# Patient Record
Sex: Female | Born: 1960 | Race: Black or African American | Hispanic: No | Marital: Single | Smoking: Former smoker
Health system: Southern US, Community
[De-identification: ages and names within clinical notes are randomized; demographics above are authoritative.]

## PROBLEM LIST (undated history)

## (undated) DIAGNOSIS — H269 Unspecified cataract: Secondary | ICD-10-CM

## (undated) DIAGNOSIS — T7840XA Allergy, unspecified, initial encounter: Secondary | ICD-10-CM

## (undated) DIAGNOSIS — M1990 Unspecified osteoarthritis, unspecified site: Secondary | ICD-10-CM

## (undated) DIAGNOSIS — R7303 Prediabetes: Secondary | ICD-10-CM

## (undated) DIAGNOSIS — E785 Hyperlipidemia, unspecified: Secondary | ICD-10-CM

## (undated) DIAGNOSIS — B182 Chronic viral hepatitis C: Secondary | ICD-10-CM

## (undated) DIAGNOSIS — K219 Gastro-esophageal reflux disease without esophagitis: Secondary | ICD-10-CM

## (undated) DIAGNOSIS — I10 Essential (primary) hypertension: Secondary | ICD-10-CM

## (undated) DIAGNOSIS — F329 Major depressive disorder, single episode, unspecified: Secondary | ICD-10-CM

## (undated) DIAGNOSIS — I639 Cerebral infarction, unspecified: Secondary | ICD-10-CM

## (undated) DIAGNOSIS — F32A Depression, unspecified: Secondary | ICD-10-CM

## (undated) HISTORY — DX: Hyperlipidemia, unspecified: E78.5

## (undated) HISTORY — DX: Gastro-esophageal reflux disease without esophagitis: K21.9

## (undated) HISTORY — DX: Allergy, unspecified, initial encounter: T78.40XA

## (undated) HISTORY — DX: Cerebral infarction, unspecified: I63.9

## (undated) HISTORY — DX: Unspecified cataract: H26.9

## (undated) HISTORY — DX: Prediabetes: R73.03

## (undated) HISTORY — PX: COLONOSCOPY: SHX174

## (undated) HISTORY — PX: NO PAST SURGERIES: SHX2092

## (undated) HISTORY — DX: Depression, unspecified: F32.A

---
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
## 1997-12-11 ENCOUNTER — Emergency Department (HOSPITAL_COMMUNITY): Admission: EM | Admit: 1997-12-11 | Discharge: 1997-12-11 | Payer: Self-pay | Admitting: Emergency Medicine

## 2005-07-06 ENCOUNTER — Encounter: Payer: Self-pay | Admitting: Emergency Medicine

## 2007-04-05 ENCOUNTER — Emergency Department (HOSPITAL_COMMUNITY): Admission: EM | Admit: 2007-04-05 | Discharge: 2007-04-05 | Payer: Self-pay | Admitting: Emergency Medicine

## 2007-04-30 ENCOUNTER — Emergency Department (HOSPITAL_COMMUNITY): Admission: EM | Admit: 2007-04-30 | Discharge: 2007-04-30 | Payer: Self-pay | Admitting: Emergency Medicine

## 2009-07-09 ENCOUNTER — Emergency Department (HOSPITAL_COMMUNITY): Admission: EM | Admit: 2009-07-09 | Discharge: 2009-07-09 | Payer: Self-pay | Admitting: Emergency Medicine

## 2009-07-09 ENCOUNTER — Ambulatory Visit: Payer: Self-pay | Admitting: Vascular Surgery

## 2009-07-27 ENCOUNTER — Emergency Department (HOSPITAL_COMMUNITY): Admission: EM | Admit: 2009-07-27 | Discharge: 2009-07-27 | Payer: Self-pay | Admitting: Emergency Medicine

## 2009-09-01 ENCOUNTER — Emergency Department (HOSPITAL_COMMUNITY): Admission: EM | Admit: 2009-09-01 | Discharge: 2009-09-01 | Payer: Self-pay | Admitting: Emergency Medicine

## 2009-09-29 ENCOUNTER — Emergency Department (HOSPITAL_COMMUNITY): Admission: EM | Admit: 2009-09-29 | Discharge: 2009-09-29 | Payer: Self-pay | Admitting: Emergency Medicine

## 2010-02-18 ENCOUNTER — Inpatient Hospital Stay (HOSPITAL_COMMUNITY)
Admission: EM | Admit: 2010-02-18 | Discharge: 2010-02-20 | Payer: Self-pay | Source: Home / Self Care | Attending: Cardiology | Admitting: Cardiology

## 2010-02-19 ENCOUNTER — Encounter (INDEPENDENT_AMBULATORY_CARE_PROVIDER_SITE_OTHER): Payer: Self-pay | Admitting: Physician Assistant

## 2010-05-17 LAB — DIFFERENTIAL
Basophils Absolute: 0.1 10*3/uL (ref 0.0–0.1)
Basophils Relative: 1 % (ref 0–1)
Eosinophils Absolute: 0.4 K/uL (ref 0.0–0.7)
Eosinophils Relative: 3 % (ref 0–5)
Lymphocytes Relative: 29 % (ref 12–46)
Lymphs Abs: 3.6 K/uL (ref 0.7–4.0)
Monocytes Absolute: 0.7 K/uL (ref 0.1–1.0)
Monocytes Relative: 6 % (ref 3–12)
Neutro Abs: 7.8 10*3/uL — ABNORMAL HIGH (ref 1.7–7.7)
Neutrophils Relative %: 62 % (ref 43–77)

## 2010-05-17 LAB — CBC
HCT: 40.2 % (ref 36.0–46.0)
Hemoglobin: 13.4 g/dL (ref 12.0–15.0)
MCH: 29 pg (ref 26.0–34.0)
MCH: 29.8 pg (ref 26.0–34.0)
MCHC: 32 g/dL (ref 30.0–36.0)
MCHC: 33.3 g/dL (ref 30.0–36.0)
MCV: 89.5 fL (ref 78.0–100.0)
MCV: 90.6 fL (ref 78.0–100.0)
Platelets: 268 10*3/uL (ref 150–400)
Platelets: 276 10*3/uL (ref 150–400)
RBC: 4.49 MIL/uL (ref 3.87–5.11)
RDW: 15.5 % (ref 11.5–15.5)
RDW: 15.7 % — ABNORMAL HIGH (ref 11.5–15.5)
WBC: 11.4 10*3/uL — ABNORMAL HIGH (ref 4.0–10.5)
WBC: 12.6 10*3/uL — ABNORMAL HIGH (ref 4.0–10.5)

## 2010-05-17 LAB — TSH: TSH: 1.368 u[IU]/mL (ref 0.350–4.500)

## 2010-05-17 LAB — CK TOTAL AND CKMB (NOT AT ARMC)
CK, MB: 0.7 ng/mL (ref 0.3–4.0)
Relative Index: INVALID (ref 0.0–2.5)
Total CK: 77 U/L (ref 7–177)

## 2010-05-17 LAB — CARDIAC PANEL(CRET KIN+CKTOT+MB+TROPI)
CK, MB: 0.5 ng/mL (ref 0.3–4.0)
CK, MB: 0.6 ng/mL (ref 0.3–4.0)
Relative Index: INVALID (ref 0.0–2.5)
Relative Index: INVALID (ref 0.0–2.5)
Total CK: 64 U/L (ref 7–177)
Troponin I: 0.01 ng/mL (ref 0.00–0.06)
Troponin I: 0.02 ng/mL (ref 0.00–0.06)

## 2010-05-17 LAB — BASIC METABOLIC PANEL
BUN: 6 mg/dL (ref 6–23)
CO2: 23 mEq/L (ref 19–32)
Calcium: 8.9 mg/dL (ref 8.4–10.5)
Calcium: 9 mg/dL (ref 8.4–10.5)
Chloride: 107 mEq/L (ref 96–112)
Chloride: 112 mEq/L (ref 96–112)
Creatinine, Ser: 0.82 mg/dL (ref 0.4–1.2)
Creatinine, Ser: 1 mg/dL (ref 0.4–1.2)
GFR calc Af Amer: >60 mL/min (ref >60)
GFR calc Af Amer: >60 mL/min (ref >60)
GFR calc non Af Amer: 59 mL/min — ABNORMAL LOW (ref >60)
GFR calc non Af Amer: >60 mL/min (ref >60)
Glucose, Bld: 134 mg/dL — ABNORMAL HIGH (ref 70–99)
Potassium: 3.7 mEq/L (ref 3.5–5.1)
Sodium: 141 mEq/L (ref 135–145)

## 2010-05-17 LAB — POCT CARDIAC MARKERS
CKMB, poc: <1 ng/mL — ABNORMAL LOW (ref 1.0–8.0)
CKMB, poc: <1 ng/mL — ABNORMAL LOW (ref 1.0–8.0)
Myoglobin, poc: 51.5 ng/mL (ref 12–200)
Troponin i, poc: <0.05 ng/mL (ref 0.00–0.09)

## 2010-05-17 LAB — APTT
aPTT: 30 seconds (ref 24–37)
aPTT: 31 seconds (ref 24–37)

## 2010-05-17 LAB — PROTIME-INR
INR: 1.08 (ref 0.00–1.49)
Prothrombin Time: 13.7 seconds (ref 11.6–15.2)
Prothrombin Time: 14.2 seconds (ref 11.6–15.2)

## 2010-05-17 LAB — HEMOGLOBIN A1C: Mean Plasma Glucose: 111 mg/dL (ref <117)

## 2010-05-17 LAB — TROPONIN I: Troponin I: 0.02 ng/mL (ref 0.00–0.06)

## 2010-05-17 LAB — D-DIMER, QUANTITATIVE: D-Dimer, Quant: 0.25 ug/mL-FEU (ref 0.00–0.48)

## 2010-05-25 LAB — POCT I-STAT, CHEM 8
Calcium, Ion: 1.19 mmol/L (ref 1.12–1.32)
Glucose, Bld: 83 mg/dL (ref 70–99)
HCT: 38 % (ref 36.0–46.0)
Hemoglobin: 12.9 g/dL (ref 12.0–15.0)

## 2010-05-25 LAB — CK: Total CK: 107 U/L (ref 7–177)

## 2010-11-29 LAB — POCT CARDIAC MARKERS
Myoglobin, poc: 70.9
Operator id: 146091
Troponin i, poc: <0.05

## 2010-11-29 LAB — I-STAT 8, (EC8 V) (CONVERTED LAB)
Acid-base deficit: 2
Bicarbonate: 25.2 — ABNORMAL HIGH
Chloride: 110
HCT: 40
Operator id: 146091
Sodium: 141
pCO2, Ven: 52.1 — ABNORMAL HIGH

## 2010-11-29 LAB — DIFFERENTIAL
Eosinophils Relative: 7 — ABNORMAL HIGH
Lymphocytes Relative: 18
Lymphs Abs: 1.7
Monocytes Relative: 7

## 2010-11-29 LAB — CBC
HCT: 36.5
Platelets: 376
RBC: 4.13
WBC: 10

## 2010-12-07 ENCOUNTER — Emergency Department (HOSPITAL_COMMUNITY)
Admission: EM | Admit: 2010-12-07 | Discharge: 2010-12-07 | Disposition: A | Discharge status: Home / Self Care | Payer: Self-pay | Attending: Emergency Medicine | Admitting: Emergency Medicine

## 2010-12-07 ENCOUNTER — Emergency Department (HOSPITAL_COMMUNITY): Payer: Self-pay

## 2010-12-07 DIAGNOSIS — S335XXA Sprain of ligaments of lumbar spine, initial encounter: Secondary | ICD-10-CM | POA: Insufficient documentation

## 2010-12-07 DIAGNOSIS — X58XXXA Exposure to other specified factors, initial encounter: Secondary | ICD-10-CM | POA: Insufficient documentation

## 2010-12-07 DIAGNOSIS — M545 Low back pain, unspecified: Secondary | ICD-10-CM | POA: Insufficient documentation

## 2010-12-07 DIAGNOSIS — R109 Unspecified abdominal pain: Secondary | ICD-10-CM | POA: Insufficient documentation

## 2010-12-07 DIAGNOSIS — R0789 Other chest pain: Secondary | ICD-10-CM | POA: Insufficient documentation

## 2010-12-07 LAB — DIFFERENTIAL
Eosinophils Absolute: 0.4 10*3/uL (ref 0.0–0.7)
Eosinophils Relative: 4 % (ref 0–5)
Lymphs Abs: 2.5 10*3/uL (ref 0.7–4.0)
Monocytes Absolute: 0.7 10*3/uL (ref 0.1–1.0)
Monocytes Relative: 6 % (ref 3–12)

## 2010-12-07 LAB — URINALYSIS, ROUTINE W REFLEX MICROSCOPIC
Glucose, UA: NEGATIVE mg/dL
Hgb urine dipstick: NEGATIVE
Protein, ur: NEGATIVE mg/dL

## 2010-12-07 LAB — POCT I-STAT, CHEM 8
BUN: 6 mg/dL (ref 6–23)
Calcium, Ion: 1.23 mmol/L (ref 1.12–1.32)
Chloride: 109 mEq/L (ref 96–112)
Glucose, Bld: 124 mg/dL — ABNORMAL HIGH (ref 70–99)

## 2010-12-07 LAB — CBC
MCH: 30.8 pg (ref 26.0–34.0)
MCV: 89.3 fL (ref 78.0–100.0)
Platelets: 264 10*3/uL (ref 150–400)
RDW: 14.6 % (ref 11.5–15.5)

## 2011-01-29 ENCOUNTER — Other Ambulatory Visit: Payer: Self-pay

## 2011-01-29 ENCOUNTER — Encounter: Payer: Self-pay | Admitting: Nurse Practitioner

## 2011-01-29 ENCOUNTER — Emergency Department (HOSPITAL_COMMUNITY)
Admission: EM | Admit: 2011-01-29 | Discharge: 2011-01-29 | Disposition: A | Discharge status: Home / Self Care | Payer: Self-pay | Attending: Emergency Medicine | Admitting: Emergency Medicine

## 2011-01-29 ENCOUNTER — Emergency Department (HOSPITAL_COMMUNITY): Payer: Self-pay

## 2011-01-29 DIAGNOSIS — M546 Pain in thoracic spine: Secondary | ICD-10-CM | POA: Insufficient documentation

## 2011-01-29 DIAGNOSIS — Z79899 Other long term (current) drug therapy: Secondary | ICD-10-CM | POA: Insufficient documentation

## 2011-01-29 DIAGNOSIS — H9209 Otalgia, unspecified ear: Secondary | ICD-10-CM | POA: Insufficient documentation

## 2011-01-29 DIAGNOSIS — IMO0002 Reserved for concepts with insufficient information to code with codable children: Secondary | ICD-10-CM | POA: Insufficient documentation

## 2011-01-29 DIAGNOSIS — Y92009 Unspecified place in unspecified non-institutional (private) residence as the place of occurrence of the external cause: Secondary | ICD-10-CM | POA: Insufficient documentation

## 2011-01-29 DIAGNOSIS — R079 Chest pain, unspecified: Secondary | ICD-10-CM | POA: Insufficient documentation

## 2011-01-29 DIAGNOSIS — I10 Essential (primary) hypertension: Secondary | ICD-10-CM | POA: Insufficient documentation

## 2011-01-29 DIAGNOSIS — M940 Chondrocostal junction syndrome [Tietze]: Secondary | ICD-10-CM | POA: Insufficient documentation

## 2011-01-29 DIAGNOSIS — R0602 Shortness of breath: Secondary | ICD-10-CM | POA: Insufficient documentation

## 2011-01-29 DIAGNOSIS — T169XXA Foreign body in ear, unspecified ear, initial encounter: Secondary | ICD-10-CM | POA: Insufficient documentation

## 2011-01-29 LAB — CBC
HCT: 38.1 % (ref 36.0–46.0)
MCV: 90.5 fL (ref 78.0–100.0)
RDW: 14.8 % (ref 11.5–15.5)
WBC: 9.6 10*3/uL (ref 4.0–10.5)

## 2011-01-29 LAB — COMPREHENSIVE METABOLIC PANEL
BUN: 13 mg/dL (ref 6–23)
CO2: 23 mEq/L (ref 19–32)
Calcium: 9.4 mg/dL (ref 8.4–10.5)
Creatinine, Ser: 0.72 mg/dL (ref 0.50–1.10)
GFR calc Af Amer: >90 mL/min (ref >90)
GFR calc non Af Amer: >90 mL/min (ref >90)
Glucose, Bld: 88 mg/dL (ref 70–99)

## 2011-01-29 LAB — POCT I-STAT TROPONIN I: Troponin i, poc: 0 ng/mL (ref 0.00–0.08)

## 2011-01-29 LAB — DIFFERENTIAL
Basophils Absolute: 0 10*3/uL (ref 0.0–0.1)
Eosinophils Relative: 3 % (ref 0–5)
Lymphocytes Relative: 23 % (ref 12–46)
Lymphs Abs: 2.2 10*3/uL (ref 0.7–4.0)
Monocytes Absolute: 0.7 10*3/uL (ref 0.1–1.0)

## 2011-01-29 MED ORDER — LIDOCAINE HCL 4 % EX SOLN
Freq: Once | CUTANEOUS | Status: AC
  Administered 2011-01-29: 11:00:00 via TOPICAL

## 2011-01-29 MED ORDER — NAPROXEN 500 MG PO TABS: 500.0000 mg | ORAL_TABLET | Freq: Two times a day (BID) | ORAL | Status: DC

## 2011-01-29 MED ORDER — ANTIPYRINE-BENZOCAINE 5.4-1.4 % OT SOLN
3.0000 [drp] | Freq: Once | OTIC | Status: AC
  Administered 2011-01-29: 3 [drp] via OTIC
  Filled 2011-01-29: qty 10

## 2011-01-29 MED ORDER — METHOCARBAMOL 500 MG PO TABS: 500.0000 mg | ORAL_TABLET | Freq: Two times a day (BID) | ORAL | Status: AC

## 2011-01-29 NOTE — ED Provider Notes (Signed)
History     CSN: [id number] Arrival date & time: 01/29/2011  9:28 AM   First MD Initiated Contact with Patient 01/29/11 1007     HPI Patient reports she was staying at relative's house over Thanksgiving. Since then has had the sensation of something moving in her right ear. Reports pain. Denies drainage, fever, sore throat, hearing loss. States any movement of her head aggravates whatever is in her ear. States "Movement causes It to move". Also reports for the past 2 days had a right-sided chest pain. States pain was gradual. Reports it begins from top down to abdomen. States pain has been constant. Worsened by palpation and deep breathing. Patient's risk factors include history of hypertension, and family history of early heart disease prior to age 53. Patient is a 50 y.o. female presenting with chest pain and foreign body in ear. The history is provided by the patient.  Chest Pain The chest pain began 2 days ago. Chest pain occurs constantly. The chest pain is unchanged. The pain is currently at 5/10. The quality of the pain is described as aching. The pain radiates to the upper back. Chest pain is worsened by deep breathing (Palpation). Primary symptoms include shortness of breath. Pertinent negatives for primary symptoms include no fever, no cough, no abdominal pain, no nausea, no vomiting and no dizziness.  The patient's medical history does not include asthma.  Pertinent negatives for associated symptoms include no numbness and no weakness.  Her past medical history is significant for hypertension.  Pertinent negatives for past medical history include no CAD, no cancer, no diabetes, no hyperlipidemia, no MI, no PE and no TIA.  Her family medical history is significant for early MI in family.    Foreign Body in Ear This is a new problem. The current episode started yesterday. The problem occurs constantly. The problem has been gradually worsening. Associated symptoms include chest pain.  Pertinent negatives include no abdominal pain, chills, coughing, fever, headaches, nausea, neck pain, numbness, sore throat, vomiting or weakness. Exacerbated by: Movement. She has tried nothing for the symptoms.    History reviewed. No pertinent past medical history.  History reviewed. No pertinent past surgical history.  History reviewed. No pertinent family history.  History  Substance Use Topics  . Smoking status: Former Smoker    Quit date: 12/06/2010  . Smokeless tobacco: Not on file  . Alcohol Use: No    OB History    Grav Para Term Preterm Abortions TAB SAB Ect Mult Living                  Review of Systems  Constitutional: Negative for fever and chills.  HENT: Positive for ear pain. Negative for hearing loss, sore throat, neck pain, neck stiffness, tinnitus and ear discharge.   Respiratory: Positive for shortness of breath. Negative for cough.   Cardiovascular: Positive for chest pain.  Gastrointestinal: Negative for nausea, vomiting and abdominal pain.  Musculoskeletal: Positive for back pain.  Neurological: Negative for dizziness, weakness, numbness and headaches.  All other systems reviewed and are negative.    Allergies  Penicillins  Home Medications   Current Outpatient Rx  Name Route Sig Dispense Refill  . METOPROLOL TARTRATE 25 MG PO TABS Oral Take 25 mg by mouth 2 (two) times daily.        BP 118/89  Pulse 90  Temp(Src) 97.7 F (36.5 C) (Oral)  Resp 20  SpO2 97%  Physical Exam  Vitals reviewed. Constitutional: She is oriented  to person, place, and time. Vital signs are normal. She appears well-developed and well-nourished.  HENT:  Head: Normocephalic and atraumatic.  Right Ear: A foreign body is present.       Patient has a living insect in right ear.  Eyes: Conjunctivae are normal. Pupils are equal, round, and reactive to light.  Neck: Normal range of motion. Neck supple.  Cardiovascular: Normal rate, regular rhythm and normal heart  sounds.  Exam reveals no friction rub.   No murmur heard. Pulmonary/Chest: Effort normal and breath sounds normal. She has no wheezes. She has no rhonchi. She has no rales. She exhibits no tenderness.  Musculoskeletal: Normal range of motion.       Tenderness with palpation of anterior right-sided chest right upper flank and right upper back. No midline spinal tenderness. Pain worse with deep breaths.  Neurological: She is alert and oriented to person, place, and time. Coordination normal.  Skin: Skin is warm and dry. No rash noted. No erythema. No pallor.    ED Course  FOREIGN BODY REMOVAL Performed by: Thomasene Lot Authorized by: Thomasene Lot Consent: Verbal consent obtained. Risks and benefits: risks, benefits and alternatives were discussed Patient understanding: patient states understanding of the procedure being performed Patient identity confirmed: verbally with patient Time out: Immediately prior to procedure a "time out" was called to verify the correct patient, procedure, equipment, support staff and site/side marked as required. Body area: ear Location details: right ear Patient sedated: no Patient restrained: no Patient cooperative: yes Localization method: visualized and magnification Removal mechanism: forceps Complexity: simple 1 objects recovered. Objects recovered: Half of blood removed from patient right ear. Wings and legs remain. Post-procedure assessment: residual foreign bodies remain Patient tolerance: Patient tolerated the procedure well with no immediate complications. Comments: Used 1% lidocaine to anesthetize consent. Remove half of insect. Remaining pieces of insect still in ear advised close follow ENT and ear flushes at home. Patient denies any pain in ear currently.     MDM          Thomasene Lot, PA 01/29/11 1246

## 2011-01-29 NOTE — ED Provider Notes (Signed)
Evaluation and management procedures were performed by the PA/NP under my supervision/collaboration.   Dione Booze, MD 01/29/11 [phone]

## 2011-01-29 NOTE — ED Notes (Signed)
Pt states "it feels like something is moving in my right ear." reports onset 0300 today. Reports body aches,dry cough, and SOB over past days also.

## 2011-03-23 ENCOUNTER — Emergency Department (HOSPITAL_COMMUNITY): Payer: Self-pay

## 2011-03-23 ENCOUNTER — Encounter (HOSPITAL_COMMUNITY): Payer: Self-pay

## 2011-03-23 ENCOUNTER — Other Ambulatory Visit: Payer: Self-pay

## 2011-03-23 ENCOUNTER — Emergency Department (HOSPITAL_COMMUNITY)
Admission: EM | Admit: 2011-03-23 | Discharge: 2011-03-23 | Disposition: A | Discharge status: Home / Self Care | Payer: Self-pay | Attending: Emergency Medicine | Admitting: Emergency Medicine

## 2011-03-23 DIAGNOSIS — Z79899 Other long term (current) drug therapy: Secondary | ICD-10-CM | POA: Insufficient documentation

## 2011-03-23 DIAGNOSIS — M79609 Pain in unspecified limb: Secondary | ICD-10-CM | POA: Insufficient documentation

## 2011-03-23 DIAGNOSIS — R0789 Other chest pain: Secondary | ICD-10-CM | POA: Insufficient documentation

## 2011-03-23 DIAGNOSIS — I10 Essential (primary) hypertension: Secondary | ICD-10-CM | POA: Insufficient documentation

## 2011-03-23 DIAGNOSIS — M79671 Pain in right foot: Secondary | ICD-10-CM

## 2011-03-23 HISTORY — DX: Essential (primary) hypertension: I10

## 2011-03-23 LAB — BASIC METABOLIC PANEL
CO2: 20 mEq/L (ref 19–32)
Chloride: 108 mEq/L (ref 96–112)
Creatinine, Ser: 0.65 mg/dL (ref 0.50–1.10)
Potassium: 3.7 mEq/L (ref 3.5–5.1)
Sodium: 138 mEq/L (ref 135–145)

## 2011-03-23 LAB — D-DIMER, QUANTITATIVE: D-Dimer, Quant: 0.24 ug/mL-FEU (ref 0.00–0.48)

## 2011-03-23 LAB — CBC
MCV: 89.7 fL (ref 78.0–100.0)
Platelets: 306 10*3/uL (ref 150–400)
RBC: 4.29 MIL/uL (ref 3.87–5.11)
WBC: 10.9 10*3/uL — ABNORMAL HIGH (ref 4.0–10.5)

## 2011-03-23 LAB — POCT I-STAT TROPONIN I: Troponin i, poc: 0 ng/mL (ref 0.00–0.08)

## 2011-03-23 MED ORDER — ASPIRIN 81 MG PO CHEW
324.0000 mg | CHEWABLE_TABLET | Freq: Once | ORAL | Status: AC
  Administered 2011-03-23: 324 mg via ORAL
  Filled 2011-03-23: qty 4

## 2011-03-23 MED ORDER — METOPROLOL TARTRATE 25 MG PO TABS: 25.0000 mg | ORAL_TABLET | Freq: Two times a day (BID) | ORAL | Status: DC

## 2011-03-23 NOTE — ED Notes (Signed)
Chest pain began last night described as cannot get my breath, discomfort, denies any n/v,  Also having rt. Foot. pain

## 2011-03-23 NOTE — ED Notes (Signed)
Pt reports chest pain since last night sharp in nature across center of chest. Pt reports pain increasing since 3am and making her SOB.

## 2011-03-23 NOTE — ED Notes (Signed)
Pt returned from xray, placed back on cardiac monitor, VSS.  

## 2011-03-23 NOTE — ED Provider Notes (Signed)
History     CSN: [id number]  Arrival date & time 03/23/11  1016   First MD Initiated Contact with Patient 03/23/11 1032      Chief Complaint  Patient presents with  . Chest Pain    (Consider location/radiation/quality/duration/timing/severity/associated sxs/prior treatment) Patient is a 51 y.o. female presenting with chest pain. The history is provided by the patient and medical records.  Chest Pain   Pt reports gradual onset of vague constant left-sided chest pain at 10pm last night, worse since 5am this morning that is described as "can't catch my breath".  No radiation of discomfort. There are associated chills. There is no associated fever, diaphoresis, cough, palpitations, abdominal pain, N/V, dizziness, or syncope. There has been no prior treatment. Risk factors include HTN and former cigarette use. Denies hx of CAD, HLD, DVT/PE, COPD, or asthma. Although pt denies any known family CAD, a prior chart mentions early CAD in a family member.  She also complaints of right dorsal mid-foot pain with NKI x 2-3 days, aching, worse with ambulation, better with rest. No prior tx.  Past Medical History  Diagnosis Date  . HTN (hypertension)     History reviewed. No pertinent past surgical history.  No family history on file.  History  Substance Use Topics  . Smoking status: Former Smoker    Quit date: 12/06/2010  . Smokeless tobacco: Not on file  . Alcohol Use: No     Review of Systems  Cardiovascular: Positive for chest pain.  10 systems reviewed and are negative for acute change except as noted in the HPI.   Allergies  Penicillins  Home Medications   Current Outpatient Rx  Name Route Sig Dispense Refill  . METOPROLOL TARTRATE 25 MG PO TABS Oral Take 25 mg by mouth 2 (two) times daily.        BP 134/76  Pulse 66  Temp(Src) 98.5 F (36.9 C) (Oral)  Resp 16  Ht 5' (1.524 m)  Wt 145 lb (65.772 kg)  BMI 28.32 kg/m2  SpO2 98%  LMP 03/03/2011  Physical Exam    Nursing note and vitals reviewed. Constitutional: She is oriented to person, place, and time. She appears well-developed and well-nourished. No distress.  HENT:  Head: Normocephalic and atraumatic.  Right Ear: External ear normal.  Left Ear: External ear normal.  Mouth/Throat: Oropharynx is clear and moist.  Eyes: Conjunctivae are normal. Pupils are equal, round, and reactive to light.  Neck: Normal range of motion. Neck supple.  Cardiovascular: Normal rate, regular rhythm and intact distal pulses.  Exam reveals no friction rub.   No murmur heard. Pulmonary/Chest: Effort normal and breath sounds normal. No respiratory distress. She has no wheezes. She exhibits no tenderness.       Speaking in complete sentences.  Abdominal: Soft. Bowel sounds are normal. She exhibits no distension. There is no tenderness.  Musculoskeletal: Normal range of motion. She exhibits no edema.       Mild tenderness to palpation of dorsum of right mid-foot with no deformity or overlying skin changes. MAEW with 5/5 bilateral grip strength, dorsi/plantar flexion.  Neurological: She is alert and oriented to person, place, and time. No cranial nerve deficit. Coordination normal.  Skin: Skin is warm and dry. No rash noted.  Psychiatric: She has a normal mood and affect.    ED Course  Procedures (including critical care time)  Labs Reviewed  CBC - Abnormal; Notable for the following:    WBC 10.9 (*)    All  other components within normal limits  BASIC METABOLIC PANEL - Abnormal; Notable for the following:    Glucose, Bld 107 (*)    All other components within normal limits  D-DIMER, QUANTITATIVE  POCT I-STAT TROPONIN I  I-STAT TROPONIN I   Dg Chest 2 View  03/23/2011  *RADIOLOGY REPORT*  Clinical Data: Chest pain, shortness of breath  CHEST - 2 VIEW  Comparison: 01/29/2011  Findings: Upper normal heart size. Mediastinal contours and pulmonary vascularity normal. Minimal chronic bronchitic changes. Linear  scarring versus atelectasis at lingula. No pulmonary infiltrate, pleural effusion, or pneumothorax. Bones unremarkable.  IMPRESSION: Minimal chronic bronchitic changes. Minimal linear scarring or atelectasis at lingula. No acute abnormalities.  Original Report Authenticated By: Lollie Marrow, M.D.   Dg Foot 2 Views Right  03/23/2011  *RADIOLOGY REPORT*  Clinical Data: Pain with ambulation.  No known injury  RIGHT FOOT - 2 VIEW  Comparison: None.  Findings: Two views of the right foot submitted.  No acute fracture or subluxation.  No periosteal reaction or bony erosion.  No radiopaque foreign body.  IMPRESSION: No acute fracture or subluxation.  Original Report Authenticated By: Natasha Mead, M.D.    Date: 03/23/2011  Rate: 72  Rhythm: NSR with sinus arrhythmia  QRS Axis: normal  Intervals: normal  ST/T Wave abnormalities: normal  Conduction Disutrbances:none  Narrative Interpretation: prior ECG demonstrates left atrial enlargement, not present on current, rate increased from prior  Old EKG Reviewed: changes noted    1. Chest discomfort   2. Foot pain, right       MDM  10:50am Pt seen and evaluated. Left-sided chest discomfort x approx 12 hours. Labs ordered to eval for ACS.  12:35pm Labs, CXR, and x-ray reviewed. Troponin negative, ECG without acute findings- do not suspect ACS. D-dimer negative, VSS- do not suspect PE. No evidence on CXR to suggest pneumonia, CHF, or other pulmonary pathology. Foto x-ray was not previously ordered, has been added.   3:43 PM All studies have been reviewed. No findings to suggest a cause for pt discomfort. She reports some improvement since arrival to ED. Will d/c home with precautions to return if discomfort worsens or persists. She has no PCP- we discussed the need for a primary doctor and I will provide the resource guide on her discharge paperwork. I will also refill her metoprolol as requested.         [address] [place], [place] 03/23/11  [phone]

## 2011-03-24 NOTE — ED Provider Notes (Signed)
Medical screening examination/treatment/procedure(s) were performed by non-physician practitioner and as supervising physician I was immediately available for consultation/collaboration.   Laray Anger, DO 03/24/11 [phone]

## 2011-05-25 ENCOUNTER — Emergency Department (HOSPITAL_COMMUNITY): Payer: Self-pay

## 2011-05-25 ENCOUNTER — Encounter (HOSPITAL_COMMUNITY): Payer: Self-pay | Admitting: *Deleted

## 2011-05-25 ENCOUNTER — Emergency Department (HOSPITAL_COMMUNITY)
Admission: EM | Admit: 2011-05-25 | Discharge: 2011-05-25 | Disposition: A | Discharge status: Home / Self Care | Payer: Self-pay | Attending: Emergency Medicine | Admitting: Emergency Medicine

## 2011-05-25 ENCOUNTER — Other Ambulatory Visit: Payer: Self-pay

## 2011-05-25 DIAGNOSIS — R0602 Shortness of breath: Secondary | ICD-10-CM | POA: Insufficient documentation

## 2011-05-25 DIAGNOSIS — R0609 Other forms of dyspnea: Secondary | ICD-10-CM | POA: Insufficient documentation

## 2011-05-25 DIAGNOSIS — M79609 Pain in unspecified limb: Secondary | ICD-10-CM | POA: Insufficient documentation

## 2011-05-25 DIAGNOSIS — R0989 Other specified symptoms and signs involving the circulatory and respiratory systems: Secondary | ICD-10-CM | POA: Insufficient documentation

## 2011-05-25 DIAGNOSIS — I10 Essential (primary) hypertension: Secondary | ICD-10-CM | POA: Insufficient documentation

## 2011-05-25 DIAGNOSIS — R0789 Other chest pain: Secondary | ICD-10-CM | POA: Insufficient documentation

## 2011-05-25 LAB — POCT I-STAT, CHEM 8
BUN: 13 mg/dL (ref 6–23)
Chloride: 108 mEq/L (ref 96–112)
HCT: 41 % (ref 36.0–46.0)
Potassium: 3.8 mEq/L (ref 3.5–5.1)
Sodium: 143 mEq/L (ref 135–145)

## 2011-05-25 LAB — CBC
Platelets: 343 10*3/uL (ref 150–400)
RBC: 4.28 MIL/uL (ref 3.87–5.11)
WBC: 7.8 10*3/uL (ref 4.0–10.5)

## 2011-05-25 LAB — POCT I-STAT TROPONIN I: Troponin i, poc: 0 ng/mL (ref 0.00–0.08)

## 2011-05-25 MED ORDER — IBUPROFEN 800 MG PO TABS
800.0000 mg | ORAL_TABLET | Freq: Once | ORAL | Status: AC
  Administered 2011-05-25: 800 mg via ORAL
  Filled 2011-05-25: qty 1

## 2011-05-25 MED ORDER — ASPIRIN 81 MG PO CHEW
324.0000 mg | CHEWABLE_TABLET | Freq: Once | ORAL | Status: AC
  Administered 2011-05-25: 324 mg via ORAL
  Filled 2011-05-25: qty 4

## 2011-05-25 MED ORDER — OXYCODONE-ACETAMINOPHEN 5-325 MG PO TABS: 1.0000 | ORAL_TABLET | ORAL | Status: AC | PRN

## 2011-05-25 MED ORDER — METOPROLOL TARTRATE 25 MG PO TABS: 25.0000 mg | ORAL_TABLET | Freq: Two times a day (BID) | ORAL | Status: DC

## 2011-05-25 MED ORDER — NAPROXEN 500 MG PO TABS: 500.0000 mg | ORAL_TABLET | Freq: Two times a day (BID) | ORAL | Status: DC

## 2011-05-25 NOTE — ED Notes (Addendum)
Pt reports worsening left sided chest pain radiating down left arm that started last night associated with nausea and sob. Pt states whenever she steps down on her left foot she has shooting pain up her body.

## 2011-05-25 NOTE — ED Provider Notes (Signed)
History     CSN: [id number]  Arrival date & time 05/25/11  1118   First MD Initiated Contact with Patient 05/25/11 1351      Chief Complaint  Patient presents with  . Shortness of Breath  . Chest Pain    (Consider location/radiation/quality/duration/timing/severity/associated sxs/prior treatment) Patient is a 51 y.o. female presenting with shortness of breath and chest pain. The history is provided by the patient.  Shortness of Breath  Associated symptoms include chest pain and shortness of breath.  Chest Pain Primary symptoms include shortness of breath.   She noticed onset yesterday of some pain in her left leg when she put her left leg down. This was mild. This morning, she noted a severe pain in the left side of her chest which is also worse when she put her leg down. This pain was also worse if she twisted her body where she took a deep breath. She describes it as a pressure feeling. The pain is currently rated at 7/10, but it has been as severe as 9/10. There has been mild dyspnea, but no nausea or vomiting or diaphoresis. She denies fever chills or cough. She has not had pain like this before. She did try taking acetaminophen with no relief. Cardiac risk factors include hypertension and smoking. She denies history of diabetes, hyperlipidemia, or family history of premature coronary atherosclerosis.  Past Medical History  Diagnosis Date  . HTN (hypertension)     History reviewed. No pertinent past surgical history.  History reviewed. No pertinent family history.  History  Substance Use Topics  . Smoking status: Former Smoker    Quit date: 12/06/2010  . Smokeless tobacco: Not on file  . Alcohol Use: No    OB History    Grav Para Term Preterm Abortions TAB SAB Ect Mult Living                  Review of Systems  Respiratory: Positive for shortness of breath.   Cardiovascular: Positive for chest pain.  All other systems reviewed and are negative.    Allergies    Penicillins  Home Medications   Current Outpatient Rx  Name Route Sig Dispense Refill  . METOPROLOL TARTRATE 25 MG PO TABS Oral Take 1 tablet (25 mg total) by mouth 2 (two) times daily. 60 tablet 0    BP 153/83  Pulse 61  Temp 98.1 F (36.7 C)  Resp 22  SpO2 98%  Physical Exam  Nursing note and vitals reviewed.  51 year old female is resting comfortably and in no acute distress. Vital signs are significant for mild tachypnea with respiratory rate of 22 and mild hypertension with blood pressure 153/83. Oxygen saturation is 98% which is normal. Head is, cephalic and atraumatic. PERRLA, she has strabismus with right eye deviated medially. There is no JVD. Lungs are clear without rales, wheezes, rhonchi. Back is nontender. Heart has regular rate and rhythm without murmur. There is mild tenderness palpation over the left anterolateral chest wall. Abdomen is soft, flat, nontender without masses or hepatosplenomegaly. Jimmy's have no cyanosis or edema, full range of motion is present. There is no tenderness. Skin is warm and dry without rash. Neurologic: Mental status is normal, cranial nerves are intact, there no focal motor or sensory deficits.  ED Course  Procedures (including critical care time)  Results for orders placed during the hospital encounter of 05/25/11  CBC      Component Value Range   WBC 7.8  4.0 - 10.5 (K/uL)  RBC 4.28  3.87 - 5.11 (MIL/uL)   Hemoglobin 12.7  12.0 - 15.0 (g/dL)   HCT 16.1  09.6 - 04.5 (%)   MCV 91.4  78.0 - 100.0 (fL)   MCH 29.7  26.0 - 34.0 (pg)   MCHC 32.5  30.0 - 36.0 (g/dL)   RDW 40.9  81.1 - 91.4 (%)   Platelets 343  150 - 400 (K/uL)  POCT I-STAT, CHEM 8      Component Value Range   Sodium 143  135 - 145 (mEq/L)   Potassium 3.8  3.5 - 5.1 (mEq/L)   Chloride 108  96 - 112 (mEq/L)   BUN 13  6 - 23 (mg/dL)   Creatinine, Ser 7.82  0.50 - 1.10 (mg/dL)   Glucose, Bld 81  70 - 99 (mg/dL)   Calcium, Ion 9.56  2.13 - 1.32 (mmol/L)   TCO2 24  0 -  100 (mmol/L)   Hemoglobin 13.9  12.0 - 15.0 (g/dL)   HCT 08.6  57.8 - 46.9 (%)  POCT I-STAT TROPONIN I      Component Value Range   Troponin i, poc 0.00  0.00 - 0.08 (ng/mL)   Comment 3           D-DIMER, QUANTITATIVE      Component Value Range   D-Dimer, Quant <0.22  0.00 - 0.48 (ug/mL-FEU)   Dg Chest 2 View  05/25/2011  *RADIOLOGY REPORT*  Clinical Data: Short of breath.  Hypertension.  Dizziness.  CHEST - 2 VIEW  Comparison: 03/23/2011  Findings:  the heart is at the upper limits of normal in size.  The aorta shows mild calcification and unfolding.  The lungs are clear except for linear scar in the lingula.  No infiltrate, mass, effusion or collapse.  No significant bony finding.  IMPRESSION: No active disease.  Original Report Authenticated By: Thomasenia Sales, M.D.     ECG shows normal sinus rhythm with a rate of 58, no ectopy. Normal axis. Normal P wave. Normal QRS. Normal intervals. Normal ST and T waves. Impression: Sinus bradycardia, otherwise normal ECG. When compared with ECG of 03/23/2011, no significant changes are seen.  ED workup is negative for serious pathology. She will be treated for musculoskeletal chest pain. She's given a prescription for Naprosyn and also for Percocet. She has requested a refill of her blood pressure medicine and she is given a new prescription for metoprolol.  1. Musculoskeletal chest pain       MDM  Chest pain which seems musculoskeletal. Old records were reviewed and she has a hospital admission in December of 2011 for chest pain and at that time had a negative Myoview stress test. Initial evaluation is also negative. She will be screened for PE with a d-dimer and given a trial of oral NSAIDs.        Dione Booze, MD 05/25/11 [phone]

## 2011-05-25 NOTE — Discharge Instructions (Signed)
Chest Pain (Nonspecific) It is often hard to give a specific diagnosis for the cause of chest pain. There is always a chance that your pain could be related to something serious, such as a heart attack or a blood clot in the lungs. You need to follow up with your caregiver for further evaluation. CAUSES   Heartburn.   Pneumonia or bronchitis.   Anxiety or stress.   Inflammation around your heart (pericarditis) or lung (pleuritis or pleurisy).   A blood clot in the lung.   A collapsed lung (pneumothorax). It can develop suddenly on its own (spontaneous pneumothorax) or from injury (trauma) to the chest.   Shingles infection (herpes zoster virus).  The chest wall is composed of bones, muscles, and cartilage. Any of these can be the source of the pain.  The bones can be bruised by injury.   The muscles or cartilage can be strained by coughing or overwork.   The cartilage can be affected by inflammation and become sore (costochondritis).  DIAGNOSIS  Lab tests or other studies, such as X-rays, electrocardiography, stress testing, or cardiac imaging, may be needed to find the cause of your pain.  TREATMENT   Treatment depends on what may be causing your chest pain. Treatment may include:   Acid blockers for heartburn.   Anti-inflammatory medicine.   Pain medicine for inflammatory conditions.   Antibiotics if an infection is present.   You may be advised to change lifestyle habits. This includes stopping smoking and avoiding alcohol, caffeine, and chocolate.   You may be advised to keep your head raised (elevated) when sleeping. This reduces the chance of acid going backward from your stomach into your esophagus.   Most of the time, nonspecific chest pain will improve within 2 to 3 days with rest and mild pain medicine.  HOME CARE INSTRUCTIONS   If antibiotics were prescribed, take your antibiotics as directed. Finish them even if you start to feel better.   For the next few  days, avoid physical activities that bring on chest pain. Continue physical activities as directed.   Do not smoke.   Avoid drinking alcohol.   Only take over-the-counter or prescription medicine for pain, discomfort, or fever as directed by your caregiver.   Follow your caregiver's suggestions for further testing if your chest pain does not go away.   Keep any follow-up appointments you made. If you do not go to an appointment, you could develop lasting (chronic) problems with pain. If there is any problem keeping an appointment, you must call to reschedule.  SEEK MEDICAL CARE IF:   You think you are having problems from the medicine you are taking. Read your medicine instructions carefully.   Your chest pain does not go away, even after treatment.   You develop a rash with blisters on your chest.  SEEK IMMEDIATE MEDICAL CARE IF:   You have increased chest pain or pain that spreads to your arm, neck, jaw, back, or abdomen.   You develop shortness of breath, an increasing cough, or you are coughing up blood.   You have severe back or abdominal pain, feel nauseous, or vomit.   You develop severe weakness, fainting, or chills.   You have a fever.  THIS IS AN EMERGENCY. Do not wait to see if the pain will go away. Get medical help at once. Call your local emergency services (911 in [place] Do not drive yourself to the hospital. MAKE SURE YOU:   Understand these instructions.     Will watch your condition.   Will get help right away if you are not doing well or get worse.  Document Released: 12/01/2004 Document Revised: 02/10/2011 Document Reviewed: 09/27/2007 [facility] Patient Information 2012 [place], [place].  Naproxen and naproxen sodium oral immediate-release tablets What is this medicine? NAPROXEN (na PROX en) is a non-steroidal anti-inflammatory drug (NSAID). It is used to reduce swelling and to treat pain. This medicine may be used for dental pain, headache, or painful  monthly periods. It is also used for painful joint and muscular problems such as arthritis, tendinitis, bursitis, and gout. This medicine may be used for other purposes; ask your health care provider or pharmacist if you have questions. What should I tell my health care provider before I take this medicine? They need to know if you have any of these conditions: -asthma -cigarette smoker -drink more than 3 alcohol containing drinks a day -heart disease or circulation problems such as heart failure or leg edema (fluid retention) -high blood pressure -kidney disease -liver disease -stomach bleeding or ulcers -an unusual or allergic reaction to naproxen, aspirin, other NSAIDs, other medicines, foods, dyes, or preservatives -pregnant or trying to get pregnant -breast-feeding How should I use this medicine? Take this medicine by mouth with a glass of water. Follow the directions on the prescription label. Take it with food if your stomach gets upset. Try to not lie down for at least 10 minutes after you take it. Take your medicine at regular intervals. Do not take your medicine more often than directed. Long-term, continuous use may increase the risk of heart attack or stroke. A special MedGuide will be given to you by the pharmacist with each prescription and refill. Be sure to read this information carefully each time. Talk to your pediatrician regarding the use of this medicine in children. Special care may be needed. Overdosage: If you think you have taken too much of this medicine contact a poison control center or emergency room at once. NOTE: This medicine is only for you. Do not share this medicine with others. What if I miss a dose? If you miss a dose, take it as soon as you can. If it is almost time for your next dose, take only that dose. Do not take double or extra doses. What may interact with this medicine? -alcohol -aspirin -cidofovir -diuretics -lithium -methotrexate -other  drugs for inflammation like ketorolac or prednisone -pemetrexed -probenecid -warfarin This list may not describe all possible interactions. Give your health care provider a list of all the medicines, herbs, non-prescription drugs, or dietary supplements you use. Also tell them if you smoke, drink alcohol, or use illegal drugs. Some items may interact with your medicine. What should I watch for while using this medicine? Tell your doctor or health care professional if your pain does not get better. Talk to your doctor before taking another medicine for pain. Do not treat yourself. This medicine does not prevent heart attack or stroke. In fact, this medicine may increase the chance of a heart attack or stroke. The chance may increase with longer use of this medicine and in people who have heart disease. If you take aspirin to prevent heart attack or stroke, talk with your doctor or health care professional. Do not take other medicines that contain aspirin, ibuprofen, or naproxen with this medicine. Side effects such as stomach upset, nausea, or ulcers may be more likely to occur. Many medicines available without a prescription should not be taken with this medicine. This medicine  can cause ulcers and bleeding in the stomach and intestines at any time during treatment. Do not smoke cigarettes or drink alcohol. These increase irritation to your stomach and can make it more susceptible to damage from this medicine. Ulcers and bleeding can happen without warning symptoms and can cause death. You may get drowsy or dizzy. Do not drive, use machinery, or do anything that needs mental alertness until you know how this medicine affects you. Do not stand or sit up quickly, especially if you are an older patient. This reduces the risk of dizzy or fainting spells. This medicine can cause you to bleed more easily. Try to avoid damage to your teeth and gums when you brush or floss your teeth. What side effects may I  notice from receiving this medicine? Side effects that you should report to your doctor or health care professional as soon as possible: -black or bloody stools, blood in the urine or vomit -blurred vision -chest pain -difficulty breathing or wheezing -nausea or vomiting -severe stomach pain -skin rash, skin redness, blistering or peeling skin, hives, or itching -slurred speech or weakness on one side of the body -swelling of eyelids, throat, lips -unexplained weight gain or swelling -unusually weak or tired -yellowing of eyes or skin Side effects that usually do not require medical attention (report to your doctor or health care professional if they continue or are bothersome): -constipation -headache -heartburn This list may not describe all possible side effects. Call your doctor for medical advice about side effects. You may report side effects to FDA at 1-800-FDA-1088. Where should I keep my medicine? Keep out of the reach of children. Store at room temperature between 15 and 30 degrees C (59 and 86 degrees F). Keep container tightly closed. Throw away any unused medicine after the expiration date. NOTE: This sheet is a summary. It may not cover all possible information. If you have questions about this medicine, talk to your doctor, pharmacist, or health care provider.  2012, [employer]vier/[employer] Standard. (02/23/2009 8:10:16 PM)  Acetaminophen; Oxycodone tablets What is this medicine? ACETAMINOPHEN; OXYCODONE (a set a MEE noe fen; ox i KOE done) is a pain reliever. It is used to treat mild to moderate pain. This medicine may be used for other purposes; ask your health care provider or pharmacist if you have questions. What should I tell my health care provider before I take this medicine? They need to know if you have any of these conditions: -brain tumor -Crohn's disease, inflammatory bowel disease, or ulcerative colitis -drink more than 3 alcohol containing drinks per day -drug  abuse or addiction -head injury -heart or circulation problems -kidney disease or problems going to the bathroom -liver disease -lung disease, asthma, or breathing problems -an unusual or allergic reaction to acetaminophen, oxycodone, other opioid analgesics, other medicines, foods, dyes, or preservatives -pregnant or trying to get pregnant -breast-feeding How should I use this medicine? Take this medicine by mouth with a full glass of water. Follow the directions on the prescription label. Take your medicine at regular intervals. Do not take your medicine more often than directed. Talk to your pediatrician regarding the use of this medicine in children. Special care may be needed. Patients over 18 years old may have a stronger reaction and need a smaller dose. Overdosage: If you think you have taken too much of this medicine contact a poison control center or emergency room at once. NOTE: This medicine is only for you. Do not share this medicine with others.  What if I miss a dose? If you miss a dose, take it as soon as you can. If it is almost time for your next dose, take only that dose. Do not take double or extra doses. What may interact with this medicine? -alcohol or medicines that contain alcohol -antihistamines -barbiturates like amobarbital, butalbital, butabarbital, methohexital, pentobarbital, phenobarbital, thiopental, and secobarbital -benztropine -drugs for bladder problems like solifenacin, trospium, oxybutynin, tolterodine, hyoscyamine, and methscopolamine -drugs for breathing problems like ipratropium and tiotropium -drugs for certain stomach or intestine problems like propantheline, homatropine methylbromide, glycopyrrolate, atropine, belladonna, and dicyclomine -general anesthetics like etomidate, ketamine, nitrous oxide, propofol, desflurane, enflurane, halothane, isoflurane, and sevoflurane -medicines for depression, anxiety, or psychotic disturbances -medicines for  pain like codeine, morphine, pentazocine, buprenorphine, butorphanol, nalbuphine, tramadol, and propoxyphene -medicines for sleep -muscle relaxants -naltrexone -phenothiazines like perphenazine, thioridazine, chlorpromazine, mesoridazine, fluphenazine, prochlorperazine, promazine, and trifluoperazine -scopolamine -trihexyphenidyl This list may not describe all possible interactions. Give your health care provider a list of all the medicines, herbs, non-prescription drugs, or dietary supplements you use. Also tell them if you smoke, drink alcohol, or use illegal drugs. Some items may interact with your medicine. What should I watch for while using this medicine? Tell your doctor or health care professional if your pain does not go away, if it gets worse, or if you have new or a different type of pain. You may develop tolerance to the medicine. Tolerance means that you will need a higher dose of the medication for pain relief. Tolerance is normal and is expected if you take this medicine for a long time. Do not suddenly stop taking your medicine because you may develop a severe reaction. Your body becomes used to the medicine. This does NOT mean you are addicted. Addiction is a behavior related to getting and using a drug for a nonmedical reason. If you have pain, you have a medical reason to take pain medicine. Your doctor will tell you how much medicine to take. If your doctor wants you to stop the medicine, the dose will be slowly lowered over time to avoid any side effects. You may get drowsy or dizzy. Do not drive, use machinery, or do anything that needs mental alertness until you know how this medicine affects you. Do not stand or sit up quickly, especially if you are an older patient. This reduces the risk of dizzy or fainting spells. Alcohol may interfere with the effect of this medicine. Avoid alcoholic drinks. The medicine will cause constipation. Try to have a bowel movement at least every 2 to  3 days. If you do not have a bowel movement for 3 days, call your doctor or health care professional. Do not take Tylenol (acetaminophen) or medicines that have acetaminophen with this medicine. Too much acetaminophen can be very dangerous. Many nonprescription medicines contain acetaminophen. Always read the labels carefully to avoid taking more acetaminophen. What side effects may I notice from receiving this medicine? Side effects that you should report to your doctor or health care professional as soon as possible: -allergic reactions like skin rash, itching or hives, swelling of the face, lips, or tongue -breathing difficulties, wheezing -confusion -light headedness or fainting spells -severe stomach pain -yellowing of the skin or the whites of the eyes Side effects that usually do not require medical attention (report to your doctor or health care professional if they continue or are bothersome): -dizziness -drowsiness -nausea -vomiting This list may not describe all possible side effects. Call your doctor for medical  advice about side effects. You may report side effects to FDA at 1-800-FDA-1088. Where should I keep my medicine? Keep out of the reach of children. This medicine can be abused. Keep your medicine in a safe place to protect it from theft. Do not share this medicine with anyone. Selling or giving away this medicine is dangerous and against the law. Store at room temperature between 20 and 25 degrees C (68 and 77 degrees F). Keep container tightly closed. Protect from light. Flush any unused medicines down the toilet. Do not use the medicine after the expiration date. NOTE: This sheet is a summary. It may not cover all possible information. If you have questions about this medicine, talk to your doctor, pharmacist, or health care provider.  2012, [employer]vier/Gold Standard. (01/21/2008 10:01:21 AM)

## 2011-08-03 ENCOUNTER — Emergency Department (HOSPITAL_COMMUNITY)
Admission: EM | Admit: 2011-08-03 | Discharge: 2011-08-03 | Disposition: A | Discharge status: Home / Self Care | Payer: Self-pay | Attending: Emergency Medicine | Admitting: Emergency Medicine

## 2011-08-03 ENCOUNTER — Encounter (HOSPITAL_COMMUNITY): Payer: Self-pay | Admitting: Emergency Medicine

## 2011-08-03 ENCOUNTER — Emergency Department (HOSPITAL_COMMUNITY): Payer: Self-pay

## 2011-08-03 DIAGNOSIS — I10 Essential (primary) hypertension: Secondary | ICD-10-CM | POA: Insufficient documentation

## 2011-08-03 DIAGNOSIS — J45901 Unspecified asthma with (acute) exacerbation: Secondary | ICD-10-CM | POA: Insufficient documentation

## 2011-08-03 LAB — CBC
MCHC: 32.3 g/dL (ref 30.0–36.0)
RDW: 15 % (ref 11.5–15.5)

## 2011-08-03 LAB — BASIC METABOLIC PANEL
GFR calc Af Amer: >90 mL/min (ref >90)
GFR calc non Af Amer: >90 mL/min (ref >90)
Potassium: 3.6 mEq/L (ref 3.5–5.1)
Sodium: 140 mEq/L (ref 135–145)

## 2011-08-03 MED ORDER — METHYLPREDNISOLONE SODIUM SUCC 125 MG IJ SOLR
125.0000 mg | Freq: Once | INTRAMUSCULAR | Status: AC
  Administered 2011-08-03: 125 mg via INTRAVENOUS
  Filled 2011-08-03: qty 2

## 2011-08-03 MED ORDER — ALBUTEROL SULFATE HFA 108 (90 BASE) MCG/ACT IN AERS
2.0000 | INHALATION_SPRAY | RESPIRATORY_TRACT | Status: DC | PRN
  Administered 2011-08-03: 2 via RESPIRATORY_TRACT
  Filled 2011-08-03: qty 6.7

## 2011-08-03 MED ORDER — IPRATROPIUM BROMIDE 0.02 % IN SOLN
0.5000 mg | Freq: Once | RESPIRATORY_TRACT | Status: AC
  Administered 2011-08-03: 0.5 mg via RESPIRATORY_TRACT
  Filled 2011-08-03: qty 2.5

## 2011-08-03 MED ORDER — ALBUTEROL (5 MG/ML) CONTINUOUS INHALATION SOLN
10.0000 mg/h | INHALATION_SOLUTION | Freq: Once | RESPIRATORY_TRACT | Status: AC
  Administered 2011-08-03: 10 mg/h via RESPIRATORY_TRACT
  Filled 2011-08-03: qty 20

## 2011-08-03 MED ORDER — ALBUTEROL SULFATE (5 MG/ML) 0.5% IN NEBU
5.0000 mg | INHALATION_SOLUTION | Freq: Once | RESPIRATORY_TRACT | Status: AC
  Administered 2011-08-03: 5 mg via RESPIRATORY_TRACT
  Filled 2011-08-03: qty 1

## 2011-08-03 MED ORDER — METOPROLOL TARTRATE 25 MG PO TABS: 25.0000 mg | ORAL_TABLET | Freq: Two times a day (BID) | ORAL | Status: DC

## 2011-08-03 NOTE — ED Provider Notes (Signed)
History     CSN: [id number]  Arrival date & time 08/03/11  1044   First MD Initiated Contact with Patient 08/03/11 1136      Chief Complaint  Patient presents with  . Shortness of Breath    (Consider location/radiation/quality/duration/timing/severity/associated sxs/prior treatment) HPI  Patient presents to the ED with complaints of  shortness of breath. She says that her symptoms started 1 hour ago. She has a history of asthma but does not have any albuterol inhaler left. She denies feeling dizzy or light headed, she denies chest pain or abdominal pain. She denies having wheezing but feels she is moving air well with wheezing.  Pt talking in complete sentences. No recent travels, no hx DVTs or PE.     Past Medical History  Diagnosis Date  . HTN (hypertension)     No past surgical history on file.  No family history on file.  History  Substance Use Topics  . Smoking status: Former Smoker    Quit date: 12/06/2010  . Smokeless tobacco: Not on file  . Alcohol Use: No    OB History    Grav Para Term Preterm Abortions TAB SAB Ect Mult Living                  Review of Systems    HEENT: denies blurry vision or change in hearing CARDIAC: denies chest pain or heart palpitations MUSCULOSKELETAL:  denies being unable to ambulate ABDOMEN AL: denies abdominal pain GU: denies loss of bowel or urinary control NEURO: denies numbness and tingling in extremities  Allergies  Penicillins  Home Medications   Current Outpatient Rx  Name Route Sig Dispense Refill  . METOPROLOL TARTRATE 25 MG PO TABS Oral Take 1 tablet (25 mg total) by mouth 2 (two) times daily. 60 tablet 0    BP 153/93  Pulse 77  Temp 98.7 F (37.1 C)  Resp 20  SpO2 99%  LMP 07/20/2011  Physical Exam  Nursing note and vitals reviewed. Constitutional: She appears well-developed and well-nourished. No distress.  HENT:  Head: Normocephalic and atraumatic.  Eyes: Pupils are equal, round, and  reactive to light.  Neck: Normal range of motion. Neck supple.  Cardiovascular: Normal rate and regular rhythm.   Pulmonary/Chest: Effort normal. No respiratory distress. She has wheezes. She has no rales.       Decreased airway movement.  Abdominal: Soft. She exhibits no distension. There is no tenderness.  Neurological: She is alert.  Skin: Skin is warm and dry.    ED Course  Procedures (including critical care time)   Labs Reviewed  CBC  BASIC METABOLIC PANEL   Dg Chest 2 View  08/03/2011  *RADIOLOGY REPORT*  Clinical Data: Right-sided chest pain and SOB  CHEST - 2 VIEW  Comparison: 05/25/2011  Findings: The heart size and mediastinal contours are within normal limits.  Both lungs are clear.  The visualized skeletal structures are unremarkable.  IMPRESSION: Negative examination.  Original Report Authenticated By: Rosealee Albee, M.D.     1. Asthma exacerbation       MDM  Patient having sensation of shortness of breath. Oxygen saturation is 100 percent on room air, RR is 20, pulse is 77. Pt given 1 breathing treatment and stated she felt some relief. She did not exhibit wheezing but had better air movement after treatment.  The patient denies having chest pressure, chest pain, cough at home, hemoptysis. Pt asked about the right sided pain she told the nurse about  and she said that her side no longer hurts.  After hour long Neb the patients air movement is much better. Chest xray negative. No recent travels, no hx DVTs or PE.   Pt has been advised of the symptoms that warrant their return to the ED. Patient has voiced understanding and has agreed to follow-up with the PCP or specialist.        Dorthula Matas, PA 08/05/11 1523

## 2011-08-03 NOTE — Discharge Instructions (Signed)

## 2011-08-03 NOTE — ED Notes (Signed)
Sob x 1 hour states also having pain on rt side and she is shaky

## 2011-08-08 NOTE — ED Provider Notes (Signed)
Medical screening examination/treatment/procedure(s) were performed by non-physician practitioner and as supervising physician I was immediately available for consultation/collaboration.  Cyndra Numbers, MD 08/08/11 [phone]

## 2011-10-02 ENCOUNTER — Encounter (HOSPITAL_COMMUNITY): Payer: Self-pay | Admitting: Emergency Medicine

## 2011-10-02 DIAGNOSIS — K625 Hemorrhage of anus and rectum: Secondary | ICD-10-CM | POA: Insufficient documentation

## 2011-10-02 LAB — BASIC METABOLIC PANEL
Chloride: 103 mEq/L (ref 96–112)
Creatinine, Ser: 0.91 mg/dL (ref 0.50–1.10)
GFR calc Af Amer: 83 mL/min — ABNORMAL LOW (ref >90)
GFR calc non Af Amer: 72 mL/min — ABNORMAL LOW (ref >90)
Potassium: 3.5 mEq/L (ref 3.5–5.1)

## 2011-10-02 LAB — CBC
HCT: 40.4 % (ref 36.0–46.0)
Hemoglobin: 13.5 g/dL (ref 12.0–15.0)
RDW: 14.6 % (ref 11.5–15.5)
WBC: 9.9 10*3/uL (ref 4.0–10.5)

## 2011-10-02 NOTE — ED Notes (Signed)
C/o bright red rectal bleeding x 3 hours when she wipes.

## 2011-10-03 ENCOUNTER — Emergency Department (HOSPITAL_COMMUNITY)
Admission: EM | Admit: 2011-10-03 | Discharge: 2011-10-03 | Discharge status: Against Medical Advice | Payer: Self-pay | Attending: Emergency Medicine | Admitting: Emergency Medicine

## 2012-10-11 ENCOUNTER — Emergency Department (HOSPITAL_COMMUNITY): Payer: Self-pay

## 2012-10-11 ENCOUNTER — Emergency Department (HOSPITAL_COMMUNITY)
Admission: EM | Admit: 2012-10-11 | Discharge: 2012-10-12 | Disposition: A | Discharge status: Home / Self Care | Payer: Self-pay | Attending: Emergency Medicine | Admitting: Emergency Medicine

## 2012-10-11 ENCOUNTER — Encounter (HOSPITAL_COMMUNITY): Payer: Self-pay | Admitting: Cardiology

## 2012-10-11 DIAGNOSIS — M549 Dorsalgia, unspecified: Secondary | ICD-10-CM | POA: Insufficient documentation

## 2012-10-11 DIAGNOSIS — R109 Unspecified abdominal pain: Secondary | ICD-10-CM | POA: Insufficient documentation

## 2012-10-11 DIAGNOSIS — I10 Essential (primary) hypertension: Secondary | ICD-10-CM | POA: Insufficient documentation

## 2012-10-11 DIAGNOSIS — Z880 Allergy status to penicillin: Secondary | ICD-10-CM | POA: Insufficient documentation

## 2012-10-11 DIAGNOSIS — J45901 Unspecified asthma with (acute) exacerbation: Secondary | ICD-10-CM | POA: Insufficient documentation

## 2012-10-11 DIAGNOSIS — Z79899 Other long term (current) drug therapy: Secondary | ICD-10-CM | POA: Insufficient documentation

## 2012-10-11 DIAGNOSIS — R42 Dizziness and giddiness: Secondary | ICD-10-CM | POA: Insufficient documentation

## 2012-10-11 DIAGNOSIS — Z3202 Encounter for pregnancy test, result negative: Secondary | ICD-10-CM | POA: Insufficient documentation

## 2012-10-11 DIAGNOSIS — Z87891 Personal history of nicotine dependence: Secondary | ICD-10-CM | POA: Insufficient documentation

## 2012-10-11 DIAGNOSIS — R0602 Shortness of breath: Secondary | ICD-10-CM | POA: Insufficient documentation

## 2012-10-11 LAB — COMPREHENSIVE METABOLIC PANEL
ALT: 47 U/L — ABNORMAL HIGH (ref 0–35)
AST: 50 U/L — ABNORMAL HIGH (ref 0–37)
Albumin: 3.2 g/dL — ABNORMAL LOW (ref 3.5–5.2)
Alkaline Phosphatase: 74 U/L (ref 39–117)
BUN: 10 mg/dL (ref 6–23)
Chloride: 107 mEq/L (ref 96–112)
Potassium: 3.7 mEq/L (ref 3.5–5.1)
Sodium: 140 mEq/L (ref 135–145)
Total Bilirubin: 0.3 mg/dL (ref 0.3–1.2)

## 2012-10-11 LAB — CBC WITH DIFFERENTIAL/PLATELET
Basophils Absolute: 0 10*3/uL (ref 0.0–0.1)
Basophils Relative: 0 % (ref 0–1)
Eosinophils Absolute: 0.3 10*3/uL (ref 0.0–0.7)
Eosinophils Relative: 3 % (ref 0–5)
HCT: 37.9 % (ref 36.0–46.0)
MCH: 30.8 pg (ref 26.0–34.0)
MCHC: 33.8 g/dL (ref 30.0–36.0)
MCV: 91.1 fL (ref 78.0–100.0)
Monocytes Absolute: 0.6 10*3/uL (ref 0.1–1.0)
Platelets: 279 10*3/uL (ref 150–400)
RDW: 14.9 % (ref 11.5–15.5)
WBC: 8.5 10*3/uL (ref 4.0–10.5)

## 2012-10-11 LAB — URINALYSIS, ROUTINE W REFLEX MICROSCOPIC
Bilirubin Urine: NEGATIVE
Ketones, ur: NEGATIVE mg/dL
Nitrite: NEGATIVE
Protein, ur: NEGATIVE mg/dL

## 2012-10-11 LAB — POCT PREGNANCY, URINE: Preg Test, Ur: NEGATIVE

## 2012-10-11 LAB — URINE MICROSCOPIC-ADD ON

## 2012-10-11 LAB — D-DIMER, QUANTITATIVE: D-Dimer, Quant: <0.27 ug/mL-FEU (ref 0.00–0.48)

## 2012-10-11 MED ORDER — IOHEXOL 350 MG/ML SOLN
100.0000 mL | Freq: Once | INTRAVENOUS | Status: AC | PRN
  Administered 2012-10-11: 100 mL via INTRAVENOUS

## 2012-10-11 MED ORDER — METOPROLOL TARTRATE 25 MG PO TABS
25.0000 mg | ORAL_TABLET | Freq: Once | ORAL | Status: AC
  Administered 2012-10-11: 25 mg via ORAL
  Filled 2012-10-11: qty 1

## 2012-10-11 MED ORDER — ALBUTEROL SULFATE HFA 108 (90 BASE) MCG/ACT IN AERS
2.0000 | INHALATION_SPRAY | RESPIRATORY_TRACT | Status: DC | PRN
  Filled 2012-10-11: qty 6.7

## 2012-10-11 MED ORDER — HYDROCHLOROTHIAZIDE 12.5 MG PO TABS: 12.5000 mg | ORAL_TABLET | Freq: Every day | ORAL | Status: DC

## 2012-10-11 NOTE — ED Notes (Signed)
Paged CT for update on wait for CT angio. CT sending for pt now

## 2012-10-11 NOTE — ED Notes (Signed)
Automatic BP 209/114, RN did manual to confirm. Manual BP 200/100

## 2012-10-11 NOTE — ED Notes (Signed)
PA-C at bedside 

## 2012-10-11 NOTE — ED Notes (Signed)
Ambulated patient in hallway while checking pulse ox, pt desat to 92% and HR went up to 130. Pt became dyspneic and lightheaded and had to sit down after approximately 30 feet. PA-C notified.

## 2012-10-11 NOTE — ED Notes (Signed)
Pt called out and stated she couldn't breathe. RN went into room and positioned pt upright in bed and instructed pt to deep breathe in through nose and out through mouth. Pt is 100% on room air. Airway intact. No stridor or adventitious lung sounds noted. Respirations equal and unlabored.

## 2012-10-11 NOTE — ED Notes (Signed)
Pt reports bilateral flank pain that started last night. States that she has increased pain with inspiration. States that she had a hard time sleeping last night. Denies any urinary symptoms or bowel symptoms. Denies any n/v but does report some dizziness.

## 2012-10-11 NOTE — ED Provider Notes (Signed)
CSN: [id number]     Arrival date & time 10/11/12  1320 History     First MD Initiated Contact with Patient 10/11/12 1612     Chief Complaint  Patient presents with  . Shortness of Breath  . Pain   (Consider location/radiation/quality/duration/timing/severity/associated sxs/prior Treatment) HPI Comments: Patient presents to the ED with a chief complaint of SOB.  The SOB started last night.  She reports associated pain with deep breathing.  She states that it was hard for her to sleep last night.  She has not tried taking anything to alleviate her symptoms.  She states that she has a history of asthma.  She states that she has had some associated dizziness.  Additionally, she also reports bilateral side/flank pain.  She states that the pain is worsened with lateral bending.  She has not tried anything to alleviate her symptoms.  She denies fevers, chills, cough, nausea, vomiting, diarrhea, dysuria, or vaginal discharge.  The history is provided by the patient. No language interpreter was used.    Past Medical History  Diagnosis Date  . HTN (hypertension)    History reviewed. No pertinent past surgical history. History reviewed. No pertinent family history. History  Substance Use Topics  . Smoking status: Former Smoker    Quit date: 12/06/2010  . Smokeless tobacco: Not on file  . Alcohol Use: Yes   OB History   Grav Para Term Preterm Abortions TAB SAB Ect Mult Living                 Review of Systems  All other systems reviewed and are negative.    Allergies  Penicillins  Home Medications   Current Outpatient Rx  Name  Route  Sig  Dispense  Refill  . albuterol (PROVENTIL HFA;VENTOLIN HFA) 108 (90 BASE) MCG/ACT inhaler   Inhalation   Inhale 2 puffs into the lungs every 4 (four) hours as needed. For shortness of breath         . ibuprofen (ADVIL,MOTRIN) 200 MG tablet   Oral   Take 400 mg by mouth every 6 (six) hours as needed. For pain         . metoprolol  tartrate (LOPRESSOR) 25 MG tablet   Oral   Take 25 mg by mouth 2 (two) times daily.          BP 192/110  Pulse 52  Temp(Src) 98.1 F (36.7 C) (Oral)  Resp 15  SpO2 100% Physical Exam  Nursing note and vitals reviewed. Constitutional: She is oriented to person, place, and time. She appears well-developed and well-nourished.  HENT:  Head: Normocephalic and atraumatic.  Eyes: Conjunctivae and EOM are normal. Pupils are equal, round, and reactive to light.  Strabismus: right eye medial deviation  Neck: Normal range of motion. Neck supple.  Cardiovascular: Normal rate and regular rhythm.  Exam reveals no gallop and no friction rub.   No murmur heard. Pulmonary/Chest: Effort normal and breath sounds normal. No respiratory distress. She has no wheezes. She has no rales. She exhibits no tenderness.  Abdominal: Soft. Bowel sounds are normal. She exhibits no distension and no mass. There is no tenderness. There is no rebound and no guarding.  Musculoskeletal: Normal range of motion. She exhibits no edema and no tenderness.  Pain on palpation of sides of torso, no bony abnormality or deformity  Neurological: She is alert and oriented to person, place, and time.  Moves all extremities  Skin: Skin is warm and dry.  Psychiatric: She  has a normal mood and affect. Her behavior is normal. Judgment and thought content normal.    ED Course   Procedures (including critical care time)  Results for orders placed during the hospital encounter of 10/11/12  COMPREHENSIVE METABOLIC PANEL      Result Value Range   Sodium 140  135 - 145 mEq/L   Potassium 3.7  3.5 - 5.1 mEq/L   Chloride 107  96 - 112 mEq/L   CO2 23  19 - 32 mEq/L   Glucose, Bld 99  70 - 99 mg/dL   BUN 10  6 - 23 mg/dL   Creatinine, Ser 4.09  0.50 - 1.10 mg/dL   Calcium 9.4  8.4 - 81.1 mg/dL   Total Protein 7.2  6.0 - 8.3 g/dL   Albumin 3.2 (*) 3.5 - 5.2 g/dL   AST 50 (*) 0 - 37 U/L   ALT 47 (*) 0 - 35 U/L   Alkaline  Phosphatase 74  39 - 117 U/L   Total Bilirubin 0.3  0.3 - 1.2 mg/dL   GFR calc non Af Amer >90  >90 mL/min   GFR calc Af Amer >90  >90 mL/min  CBC WITH DIFFERENTIAL      Result Value Range   WBC 8.5  4.0 - 10.5 K/uL   RBC 4.16  3.87 - 5.11 MIL/uL   Hemoglobin 12.8  12.0 - 15.0 g/dL   HCT 91.4  78.2 - 95.6 %   MCV 91.1  78.0 - 100.0 fL   MCH 30.8  26.0 - 34.0 pg   MCHC 33.8  30.0 - 36.0 g/dL   RDW 21.3  08.6 - 57.8 %   Platelets 279  150 - 400 K/uL   Neutrophils Relative % 67  43 - 77 %   Neutro Abs 5.7  1.7 - 7.7 K/uL   Lymphocytes Relative 23  12 - 46 %   Lymphs Abs 2.0  0.7 - 4.0 K/uL   Monocytes Relative 7  3 - 12 %   Monocytes Absolute 0.6  0.1 - 1.0 K/uL   Eosinophils Relative 3  0 - 5 %   Eosinophils Absolute 0.3  0.0 - 0.7 K/uL   Basophils Relative 0  0 - 1 %   Basophils Absolute 0.0  0.0 - 0.1 K/uL  URINALYSIS, ROUTINE W REFLEX MICROSCOPIC      Result Value Range   Color, Urine YELLOW  YELLOW   APPearance CLOUDY (*) CLEAR   Specific Gravity, Urine 1.016  1.005 - 1.030   pH 7.0  5.0 - 8.0   Glucose, UA NEGATIVE  NEGATIVE mg/dL   Hgb urine dipstick NEGATIVE  NEGATIVE   Bilirubin Urine NEGATIVE  NEGATIVE   Ketones, ur NEGATIVE  NEGATIVE mg/dL   Protein, ur NEGATIVE  NEGATIVE mg/dL   Urobilinogen, UA 1.0  0.0 - 1.0 mg/dL   Nitrite NEGATIVE  NEGATIVE   Leukocytes, UA SMALL (*) NEGATIVE  URINE MICROSCOPIC-ADD ON      Result Value Range   Squamous Epithelial / LPF MANY (*) RARE   WBC, UA 0-2  <3 WBC/hpf   Bacteria, UA RARE  RARE   Urine-Other MUCOUS PRESENT    D-DIMER, QUANTITATIVE      Result Value Range   D-Dimer, Quant <0.27  0.00 - 0.48 ug/mL-FEU  TROPONIN I      Result Value Range   Troponin I <0.30  <0.30 ng/mL  POCT I-STAT TROPONIN I      Result Value Range  Troponin i, poc 0.00  0.00 - 0.08 ng/mL   Comment 3           POCT PREGNANCY, URINE      Result Value Range   Preg Test, Ur NEGATIVE  NEGATIVE   Dg Chest 2 View  10/11/2012   *RADIOLOGY REPORT*   Clinical Data: Very short of breath, left-sided pain  CHEST - 2 VIEW  Comparison: Chest x-ray of 08/03/2011  Findings: Linear atelectasis is noted at the lung bases left greater than right.  No infiltrate or effusion is seen. Mediastinal contours are stable.  The heart is within upper limits of normal and stable.  No bony abnormality is seen.  IMPRESSION: Bibasilar linear atelectasis.  No active infiltrate or effusion.   Original Report Authenticated By: Dwyane Dee, M.D.   Ct Angio Chest Pe W/cm &/or Wo Cm  10/12/2012   *RADIOLOGY REPORT*  Clinical Data: Shortness of breath pleuritic chest pain, hypertension  CT ANGIOGRAPHY CHEST  Technique:  Multidetector CT imaging of the chest using the standard protocol during bolus administration of intravenous contrast. Multiplanar reconstructed images including MIPs were obtained and reviewed to evaluate the vascular anatomy.  Contrast: OMNIPAQUE IOHEXOL 350 MG/ML SOLN  Comparison: 10/11/2012 chest x-ray  Findings: Negative for significant acute pulmonary embolus by CTA. Minor atherosclerotic changes of the thoracic aorta.  Normal heart size.  No pericardial or pleural effusion.  No hiatal hernia. Negative for adenopathy.  Included upper abdomen demonstrates no acute process.  Lung windows demonstrate low lung volumes with dependent inferior right middle lobe, lingula, and bibasilar atelectasis.  Negative for definite pneumonia, collapse consolidation.  No edema or interstitial process.  No pneumothorax.  No acute osseous finding.  IMPRESSION: Negative for significant acute pulmonary embolus.  Low lung volumes scattered atelectasis.   Original Report Authenticated By: Judie Petit. Shick, M.D.      1. Shortness of breath   2. Back pain   3. Dizziness     MDM  Patient with SOB, hx of asthma, but no wheezing.  Dizziness, possible due to HTN.  193/110.  Will treat with metoprolol.  Side pain is likely msk, as it is reproducible with palpation and with bending.  6:32  PM Filed Vitals:   10/11/12 1815  BP:   Pulse: 57  Temp:   Resp: 19   BP 165/93  7:20 PM Discussed with Dr. Criss Alvine.  Low risk for PE, will order D-dimer.  If negative, discharge to home with PCP follow-up.  Dizziness has resolved.    8:02 PM D-dimer is negative.  Discharge to home in good condition.  8:27 PM Nurse reports high BP.  Patient states that she feels a little light-headed.  Patient discussed with Dr. Criss Alvine, who tells me that I can start her on HCTZ, and discharge her to home as there is no evidence of end organ damage.  8:40 PM Patient states that she feels out of breath.  Desats with ambulation and has tachy to 130.  Will order chest CT and repeat EKG.  12:25 AM Patient states that she still feels weak.  She is able to ambulate.  Will discharge to home.  Discussed with Dr. Criss Alvine, who agrees with the plan.    Roxy Horseman, PA-C 10/12/12 [phone]

## 2012-10-11 NOTE — ED Notes (Signed)
Pt transported to CT ?

## 2012-10-12 NOTE — ED Provider Notes (Signed)
Medical screening examination/treatment/procedure(s) were performed by non-physician practitioner and as supervising physician I was immediately available for consultation/collaboration.   Audree Camel, MD 10/12/12 [phone]

## 2013-09-11 ENCOUNTER — Emergency Department (HOSPITAL_COMMUNITY)
Admission: EM | Admit: 2013-09-11 | Discharge: 2013-09-11 | Disposition: A | Discharge status: Home / Self Care | Payer: Self-pay | Attending: Emergency Medicine | Admitting: Emergency Medicine

## 2013-09-11 ENCOUNTER — Encounter (HOSPITAL_COMMUNITY): Payer: Self-pay | Admitting: Emergency Medicine

## 2013-09-11 ENCOUNTER — Emergency Department (HOSPITAL_COMMUNITY): Payer: Self-pay

## 2013-09-11 DIAGNOSIS — Z880 Allergy status to penicillin: Secondary | ICD-10-CM | POA: Insufficient documentation

## 2013-09-11 DIAGNOSIS — R109 Unspecified abdominal pain: Secondary | ICD-10-CM | POA: Insufficient documentation

## 2013-09-11 DIAGNOSIS — R1031 Right lower quadrant pain: Secondary | ICD-10-CM | POA: Insufficient documentation

## 2013-09-11 DIAGNOSIS — I10 Essential (primary) hypertension: Secondary | ICD-10-CM | POA: Insufficient documentation

## 2013-09-11 DIAGNOSIS — M5442 Lumbago with sciatica, left side: Secondary | ICD-10-CM

## 2013-09-11 DIAGNOSIS — M5430 Sciatica, unspecified side: Secondary | ICD-10-CM | POA: Insufficient documentation

## 2013-09-11 DIAGNOSIS — M5441 Lumbago with sciatica, right side: Secondary | ICD-10-CM

## 2013-09-11 DIAGNOSIS — Z79899 Other long term (current) drug therapy: Secondary | ICD-10-CM | POA: Insufficient documentation

## 2013-09-11 DIAGNOSIS — Z87891 Personal history of nicotine dependence: Secondary | ICD-10-CM | POA: Insufficient documentation

## 2013-09-11 DIAGNOSIS — R1032 Left lower quadrant pain: Secondary | ICD-10-CM | POA: Insufficient documentation

## 2013-09-11 LAB — CBC
HCT: 38 % (ref 36.0–46.0)
Hemoglobin: 12.3 g/dL (ref 12.0–15.0)
MCH: 29.4 pg (ref 26.0–34.0)
MCHC: 32.4 g/dL (ref 30.0–36.0)
MCV: 90.9 fL (ref 78.0–100.0)
PLATELETS: 266 10*3/uL (ref 150–400)
RBC: 4.18 MIL/uL (ref 3.87–5.11)
RDW: 14.5 % (ref 11.5–15.5)
WBC: 7.9 10*3/uL (ref 4.0–10.5)

## 2013-09-11 LAB — COMPREHENSIVE METABOLIC PANEL
ALBUMIN: 3.3 g/dL — AB (ref 3.5–5.2)
ALK PHOS: 74 U/L (ref 39–117)
ALT: 20 U/L (ref 0–35)
AST: 25 U/L (ref 0–37)
Anion gap: 12 (ref 5–15)
BILIRUBIN TOTAL: 0.3 mg/dL (ref 0.3–1.2)
BUN: 13 mg/dL (ref 6–23)
CHLORIDE: 103 meq/L (ref 96–112)
CO2: 26 mEq/L (ref 19–32)
CREATININE: 0.93 mg/dL (ref 0.50–1.10)
Calcium: 9.2 mg/dL (ref 8.4–10.5)
GFR calc Af Amer: 80 mL/min — ABNORMAL LOW (ref >90)
GFR calc non Af Amer: 69 mL/min — ABNORMAL LOW (ref >90)
Glucose, Bld: 90 mg/dL (ref 70–99)
POTASSIUM: 3.8 meq/L (ref 3.7–5.3)
SODIUM: 141 meq/L (ref 137–147)
Total Protein: 7.4 g/dL (ref 6.0–8.3)

## 2013-09-11 LAB — URINALYSIS, ROUTINE W REFLEX MICROSCOPIC
GLUCOSE, UA: NEGATIVE mg/dL
HGB URINE DIPSTICK: NEGATIVE
KETONES UR: 15 mg/dL — AB
Nitrite: NEGATIVE
PH: 7 (ref 5.0–8.0)
Protein, ur: 30 mg/dL — AB
Specific Gravity, Urine: 1.033 — ABNORMAL HIGH (ref 1.005–1.030)
Urobilinogen, UA: 1 mg/dL (ref 0.0–1.0)

## 2013-09-11 LAB — URINE MICROSCOPIC-ADD ON

## 2013-09-11 MED ORDER — KETOROLAC TROMETHAMINE 60 MG/2ML IM SOLN
60.0000 mg | Freq: Once | INTRAMUSCULAR | Status: AC
  Administered 2013-09-11: 60 mg via INTRAMUSCULAR
  Filled 2013-09-11: qty 2

## 2013-09-11 NOTE — ED Notes (Signed)
Per pt sts she is having lower back pain, abdominal pain and pain with standing. Denies N,V,D. Denies urinary symptoms or vaginal symptoms.

## 2013-09-11 NOTE — Discharge Planning (Signed)
[place] to patient about primary care resources and establishing care with a provider. [facility] card application and resources provided, my contact information was also given for any future questions or concerns.

## 2013-09-11 NOTE — ED Provider Notes (Signed)
Medical screening examination/treatment/procedure(s) were performed by non-physician practitioner and as supervising physician I was immediately available for consultation/collaboration.   EKG Interpretation None        Hoy Morn, MD 09/11/13 [phone]

## 2013-09-11 NOTE — ED Provider Notes (Signed)
CSN: [id number]     Arrival date & time 09/11/13  1118 History   First MD Initiated Contact with Patient 09/11/13 1124     Chief Complaint  Patient presents with  . Abdominal Pain  . Back Pain     (Consider location/radiation/quality/duration/timing/severity/associated sxs/prior Treatment) HPI Comments: 53 year old female with a past medical history of hypertension presents to the emergency department complaining of bilateral "side" pain. Patient describes the pain as sharp, radiating towards her back and to both of her legs. States the back pain gets worse with standing, radiating down her legs rated 8/10. She tried taking an aspirin with no relief. No known injury or trauma. Denies fever, chills, increased urinary frequency, urgency, dysuria, hematuria, vaginal bleeding or discharge, nausea, vomiting or diarrhea. Denies loss of control bowels or bladder or saddle anesthesia. States her legs occasionally feel weak.  Patient is a 53 y.o. female presenting with abdominal pain and back pain. The history is provided by the patient.  Abdominal Pain Back Pain Associated symptoms: abdominal pain and weakness     Past Medical History  Diagnosis Date  . HTN (hypertension)    History reviewed. No pertinent past surgical history. History reviewed. No pertinent family history. History  Substance Use Topics  . Smoking status: Former Smoker    Quit date: 12/06/2010  . Smokeless tobacco: Not on file  . Alcohol Use: Yes   OB History   Grav Para Term Preterm Abortions TAB SAB Ect Mult Living                 Review of Systems  Gastrointestinal: Positive for abdominal pain.  Genitourinary: Positive for flank pain.  Musculoskeletal: Positive for back pain.  Neurological: Positive for weakness.  All other systems reviewed and are negative.     Allergies  Penicillins  Home Medications   Prior to Admission medications   Medication Sig Start Date End Date Taking? Authorizing Provider   albuterol (PROVENTIL HFA;VENTOLIN HFA) 108 (90 BASE) MCG/ACT inhaler Inhale 2 puffs into the lungs every 4 (four) hours as needed. For shortness of breath   Yes Historical Provider, MD  ibuprofen (ADVIL,MOTRIN) 200 MG tablet Take 400 mg by mouth every 6 (six) hours as needed. For pain   Yes Historical Provider, MD  metoprolol tartrate (LOPRESSOR) 25 MG tablet Take 25 mg by mouth 2 (two) times daily. 08/03/11  Yes Tiffany Marilu Favre, PA-C   BP 162/86  Pulse 56  Temp(Src) 98.6 F (37 C) (Oral)  Resp 18  Wt 139 lb (63.05 kg)  SpO2 100%  LMP 09/27/2012 Physical Exam  Nursing note and vitals reviewed. Constitutional: She is oriented to person, place, and time. She appears well-developed and well-nourished. No distress.  HENT:  Head: Normocephalic and atraumatic.  Mouth/Throat: Oropharynx is clear and moist.  Eyes: Conjunctivae are normal.  Neck: Normal range of motion. Neck supple. No spinous process tenderness and no muscular tenderness present.  Cardiovascular: Normal rate, regular rhythm and normal heart sounds.   Pulmonary/Chest: Effort normal and breath sounds normal. No respiratory distress.  Abdominal: Soft. Bowel sounds are normal. There is tenderness (mild) in the right lower quadrant, suprapubic area and left lower quadrant. There is no rigidity, no rebound, no guarding and no CVA tenderness.  No peritoneal signs.  Musculoskeletal: Normal range of motion. She exhibits no edema.  Tender to palpation bilateral lumbar paraspinal muscles. Full lumbar range of motion, pain with lumbar flexion.  Neurological: She is alert and oriented to person, place, and time.  She has normal strength.  Strength lower extremities 5/5 and equal bilateral. Sensation intact. Normal gait.  Skin: Skin is warm and dry. No rash noted. She is not diaphoretic.  Psychiatric: She has a normal mood and affect. Her behavior is normal.    ED Course  Procedures (including critical care time) Labs Review Labs  Reviewed  COMPREHENSIVE METABOLIC PANEL - Abnormal; Notable for the following:    Albumin 3.3 (*)    GFR calc non Af Amer 69 (*)    GFR calc Af Amer 80 (*)    All other components within normal limits  URINALYSIS, ROUTINE W REFLEX MICROSCOPIC - Abnormal; Notable for the following:    Color, Urine AMBER (*)    Specific Gravity, Urine 1.033 (*)    Bilirubin Urine SMALL (*)    Ketones, ur 15 (*)    Protein, ur 30 (*)    Leukocytes, UA SMALL (*)    All other components within normal limits  URINE MICROSCOPIC-ADD ON - Abnormal; Notable for the following:    Squamous Epithelial / LPF FEW (*)    Bacteria, UA FEW (*)    All other components within normal limits  URINE CULTURE  CBC    Imaging Review Dg Lumbar Spine Complete  09/11/2013   CLINICAL DATA:  ABDOMINAL PAIN BACK PAIN  EXAM: LUMBAR SPINE - COMPLETE 4+ VIEW  COMPARISON:  None.  FINDINGS: There is no evidence of lumbar spine fracture. Alignment is normal. Intervertebral disc spaces are maintained.  IMPRESSION: Negative.   Electronically Signed   By: Margaree Mackintosh M.D.   On: 09/11/2013 13:01     EKG Interpretation None      MDM   Final diagnoses:  Bilateral low back pain with sciatica, sciatica laterality unspecified   Patient presenting with bilateral flank pain radiating towards her back. No CVA tenderness. She has suprapubic and bilateral lower abdominal tenderness without peritoneal signs. She is also tender in her lumbar paraspinal muscles. No known injury or trauma. Plan to obtain labs, urinalysis and obtain plain film of lumbar spine. She is well appearing and in no apparent distress. Afebrile, hypertensive at 135/106, vitals otherwise stable. Toradol given for pain.  Labs without any acute finding. Urinalysis with few squamous epithelial cells, 3-6 white blood cells, few bacteria and small leukocytes. I do not feel these results are significant enough to treat for UTI at this time. Urine culture pending. Patient reports  great improvement of her pain after receiving Toradol. No red flags concerning patient's back pain. No s/s of central cord compression or cauda equina. Lower extremities are neurovascularly intact and patient is ambulating without difficulty. Doubt any intra-abdominal cause for patient's symptoms. Tenderness to lower abdomen is mild. Symptoms more consistent with sciatica. Advised NSAIDs for pain, f/u with PCP. Stable for d/c. Return precautions given. Patient states understanding of treatment care plan and is agreeable.  Illene Labrador, PA-C 09/11/13 1500

## 2013-09-11 NOTE — Discharge Instructions (Signed)
Take ibuprofen or Tylenol every 4-6 hours as needed for pain. Follow up with the wellness clinic or one of the resources below to establish care with a primary care physician.  Sciatica Sciatica is pain, weakness, numbness, or tingling along the path of the sciatic nerve. The nerve starts in the lower back and runs down the back of each leg. The nerve controls the muscles in the lower leg and in the back of the knee, while also providing sensation to the back of the thigh, lower leg, and the sole of your foot. Sciatica is a symptom of another medical condition. For instance, nerve damage or certain conditions, such as a herniated disk or bone spur on the spine, pinch or put pressure on the sciatic nerve. This causes the pain, weakness, or other sensations normally associated with sciatica. Generally, sciatica only affects one side of the body. CAUSES   Herniated or slipped disc.  Degenerative disk disease.  A pain disorder involving the narrow muscle in the buttocks (piriformis syndrome).  Pelvic injury or fracture.  Pregnancy.  Tumor (rare). SYMPTOMS  Symptoms can vary from mild to very severe. The symptoms usually travel from the low back to the buttocks and down the back of the leg. Symptoms can include:  Mild tingling or dull aches in the lower back, leg, or hip.  Numbness in the back of the calf or sole of the foot.  Burning sensations in the lower back, leg, or hip.  Sharp pains in the lower back, leg, or hip.  Leg weakness.  Severe back pain inhibiting movement. These symptoms may get worse with coughing, sneezing, laughing, or prolonged sitting or standing. Also, being overweight may worsen symptoms. DIAGNOSIS  Your caregiver will perform a physical exam to look for common symptoms of sciatica. He or she may ask you to do certain movements or activities that would trigger sciatic nerve pain. Other tests may be performed to find the cause of the sciatica. These may  include:  Blood tests.  X-rays.  Imaging tests, such as an MRI or CT scan. TREATMENT  Treatment is directed at the cause of the sciatic pain. Sometimes, treatment is not necessary and the pain and discomfort goes away on its own. If treatment is needed, your caregiver may suggest:  Over-the-counter medicines to relieve pain.  Prescription medicines, such as anti-inflammatory medicine, muscle relaxants, or narcotics.  Applying heat or ice to the painful area.  Steroid injections to lessen pain, irritation, and inflammation around the nerve.  Reducing activity during periods of pain.  Exercising and stretching to strengthen your abdomen and improve flexibility of your spine. Your caregiver may suggest losing weight if the extra weight makes the back pain worse.  Physical therapy.  Surgery to eliminate what is pressing or pinching the nerve, such as a bone spur or part of a herniated disk. HOME CARE INSTRUCTIONS   Only take over-the-counter or prescription medicines for pain or discomfort as directed by your caregiver.  Apply ice to the affected area for 20 minutes, 3-4 times a day for the first 48-72 hours. Then try heat in the same way.  Exercise, stretch, or perform your usual activities if these do not aggravate your pain.  Attend physical therapy sessions as directed by your caregiver.  Keep all follow-up appointments as directed by your caregiver.  Do not wear high heels or shoes that do not provide proper support.  Check your mattress to see if it is too soft. A firm mattress may  lessen your pain and discomfort. SEEK IMMEDIATE MEDICAL CARE IF:   You lose control of your bowel or bladder (incontinence).  You have increasing weakness in the lower back, pelvis, buttocks, or legs.  You have redness or swelling of your back.  You have a burning sensation when you urinate.  You have pain that gets worse when you lie down or awakens you at night.  Your pain is worse  than you have experienced in the past.  Your pain is lasting longer than 4 weeks.  You are suddenly losing weight without reason. MAKE SURE YOU:  Understand these instructions.  Will watch your condition.  Will get help right away if you are not doing well or get worse. Document Released: 02/15/2001 Document Revised: 08/23/2011 Document Reviewed: 07/03/2011 [facility] Patient Information 2015 [place], [place]. This information is not intended to replace advice given to you by your health care provider. Make sure you discuss any questions you have with your health care provider.

## 2013-09-12 LAB — URINE CULTURE: Colony Count: 40000

## 2016-07-15 ENCOUNTER — Encounter (HOSPITAL_COMMUNITY): Payer: Self-pay

## 2016-07-15 ENCOUNTER — Emergency Department (HOSPITAL_COMMUNITY): Payer: Self-pay

## 2016-07-15 DIAGNOSIS — Z79899 Other long term (current) drug therapy: Secondary | ICD-10-CM | POA: Insufficient documentation

## 2016-07-15 DIAGNOSIS — I10 Essential (primary) hypertension: Secondary | ICD-10-CM | POA: Insufficient documentation

## 2016-07-15 DIAGNOSIS — Z87891 Personal history of nicotine dependence: Secondary | ICD-10-CM | POA: Insufficient documentation

## 2016-07-15 DIAGNOSIS — M546 Pain in thoracic spine: Secondary | ICD-10-CM | POA: Insufficient documentation

## 2016-07-15 DIAGNOSIS — R072 Precordial pain: Secondary | ICD-10-CM | POA: Insufficient documentation

## 2016-07-15 LAB — BASIC METABOLIC PANEL
Anion gap: 8 (ref 5–15)
BUN: 15 mg/dL (ref 6–20)
CALCIUM: 9.7 mg/dL (ref 8.9–10.3)
CO2: 26 mmol/L (ref 22–32)
CREATININE: 1.02 mg/dL — AB (ref 0.44–1.00)
Chloride: 107 mmol/L (ref 101–111)
GFR calc non Af Amer: >60 mL/min (ref >60)
Glucose, Bld: 130 mg/dL — ABNORMAL HIGH (ref 65–99)
Potassium: 3.8 mmol/L (ref 3.5–5.1)
SODIUM: 141 mmol/L (ref 135–145)

## 2016-07-15 LAB — CBC
HCT: 38.9 % (ref 36.0–46.0)
Hemoglobin: 12.5 g/dL (ref 12.0–15.0)
MCH: 28.7 pg (ref 26.0–34.0)
MCHC: 32.1 g/dL (ref 30.0–36.0)
MCV: 89.2 fL (ref 78.0–100.0)
PLATELETS: 297 10*3/uL (ref 150–400)
RBC: 4.36 MIL/uL (ref 3.87–5.11)
RDW: 14.8 % (ref 11.5–15.5)
WBC: 8.7 10*3/uL (ref 4.0–10.5)

## 2016-07-15 NOTE — ED Triage Notes (Signed)
Pt arrived via EMS with c/o sudden onset of central chest pain/burning radiating into back; Per EMS pain started 45 minutes after eating dinner; pt tearful at triage and gripping chest; pt a&ox 4 on arrival; pt denies prior cardiac hx; Pt states pain is at 10/10 and makes it hard to breath;

## 2016-07-16 ENCOUNTER — Emergency Department (HOSPITAL_COMMUNITY)
Admission: EM | Admit: 2016-07-16 | Discharge: 2016-07-16 | Disposition: A | Discharge status: Home / Self Care | Payer: Self-pay | Attending: Emergency Medicine | Admitting: Emergency Medicine

## 2016-07-16 DIAGNOSIS — M546 Pain in thoracic spine: Secondary | ICD-10-CM

## 2016-07-16 DIAGNOSIS — R072 Precordial pain: Secondary | ICD-10-CM

## 2016-07-16 LAB — I-STAT TROPONIN, ED: Troponin i, poc: 0 ng/mL (ref 0.00–0.08)

## 2016-07-16 LAB — D-DIMER, QUANTITATIVE: D-Dimer, Quant: 0.34 ug/mL-FEU (ref 0.00–0.50)

## 2016-07-16 MED ORDER — KETOROLAC TROMETHAMINE 30 MG/ML IJ SOLN
30.0000 mg | Freq: Once | INTRAMUSCULAR | Status: AC
  Administered 2016-07-16: 30 mg via INTRAVENOUS
  Filled 2016-07-16: qty 1

## 2016-07-16 MED ORDER — LORAZEPAM 2 MG/ML IJ SOLN
0.5000 mg | Freq: Once | INTRAMUSCULAR | Status: AC
  Administered 2016-07-16: 0.5 mg via INTRAVENOUS
  Filled 2016-07-16: qty 1

## 2016-07-16 MED ORDER — MORPHINE SULFATE (PF) 4 MG/ML IV SOLN
4.0000 mg | Freq: Once | INTRAVENOUS | Status: AC
  Administered 2016-07-16: 4 mg via INTRAVENOUS
  Filled 2016-07-16: qty 1

## 2016-07-16 NOTE — ED Provider Notes (Signed)
[facility] DEPT Provider Note    By signing my name below, I, Bea Graff, attest that this documentation has been prepared under the direction and in the presence of Jola Schmidt, MD. Electronically Signed: Bea Graff, ED Scribe. 07/16/16. 3:31 AM.    History   Chief Complaint Chief Complaint  Patient presents with  . Chest Pain  . Back Pain   The history is provided by the patient and medical records. No language interpreter was used.    HPI Comments:  Katherine Henson is a 56 y.o. female with PMHx of HTN, brought in by EMS, who presents to the Emergency Department complaining of SOB that began PTA. She reports associated chills and burning CP that radiates to her back. She states the pain began in her back earlier today while she at home doing house work. She has taken Ibuprofen for pain relief with no significant relief. Certain movements and positions increase the back pain. Deep breathing increases the chest pain. She denies alleviating factors. She denies fever, cough, abdominal pain. She denies h/o DVT or PE.    Past Medical History:  Diagnosis Date  . HTN (hypertension)     There are no active problems to display for this patient.   History reviewed. No pertinent surgical history.  OB History    No data available       Home Medications    Prior to Admission medications   Medication Sig Start Date End Date Taking? Authorizing Provider  albuterol (PROVENTIL HFA;VENTOLIN HFA) 108 (90 BASE) MCG/ACT inhaler Inhale 2 puffs into the lungs every 4 (four) hours as needed. For shortness of breath   Yes [provider]  ibuprofen (ADVIL,MOTRIN) 200 MG tablet Take 400 mg by mouth every 6 (six) hours as needed for mild pain.    Yes [provider]  metoprolol tartrate (LOPRESSOR) 25 MG tablet Take 25 mg by mouth 2 (two) times daily. 08/03/11  Yes Delos Haring, PA-C    Family History No family history on file.  Social History Social  History  Substance Use Topics  . Smoking status: Former Smoker    Quit date: 12/06/2010  . Smokeless tobacco: Not on file  . Alcohol use Yes     Allergies   Penicillins   Review of Systems Review of Systems All other systems reviewed and are negative for acute change except as noted in the HPI.   Physical Exam Updated Vital Signs BP (!) 146/68 (BP Location: Left Arm)   Pulse 60   Temp 98.1 F (36.7 C)   Resp 20   LMP 09/27/2012   SpO2 99%   Physical Exam  Constitutional: She is oriented to person, place, and time. She appears well-developed and well-nourished. No distress.  HENT:  Head: Normocephalic and atraumatic.  Eyes: EOM are normal.  Neck: Normal range of motion.  Cardiovascular: Normal rate, regular rhythm and normal heart sounds.   Pulmonary/Chest: Effort normal and breath sounds normal. She exhibits tenderness.  TTP to anterior chest.  Abdominal: Soft. She exhibits no distension. There is no tenderness.  Musculoskeletal: Normal range of motion. She exhibits no edema or tenderness.  Neurological: She is alert and oriented to person, place, and time.  Skin: Skin is warm and dry.  Psychiatric: She has a normal mood and affect. Judgment normal.  Nursing note and vitals reviewed.    ED Treatments / Results  DIAGNOSTIC STUDIES: Oxygen Saturation is 99% on RA, normal by my interpretation.   COORDINATION OF CARE: 1:05  AM- Will order Morphine, Toradol and Ativan. Pt verbalizes understanding and agrees to plan.  Medications  morphine 4 MG/ML injection 4 mg (4 mg Intravenous Given 07/16/16 0149)  ketorolac (TORADOL) 30 MG/ML injection 30 mg (30 mg Intravenous Given 07/16/16 0149)  LORazepam (ATIVAN) injection 0.5 mg (0.5 mg Intravenous Given 07/16/16 0149)    Labs (all labs ordered are listed, but only abnormal results are displayed) Labs Reviewed  BASIC METABOLIC PANEL - Abnormal; Notable for the following:       Result Value   Glucose, Bld 130 (*)     Creatinine, Ser 1.02 (*)    All other components within normal limits  CBC  D-DIMER, QUANTITATIVE (NOT AT [facility])  I-STAT TROPOININ, ED  I-STAT TROPOININ, ED    EKG  EKG Interpretation  Date/Time:  Friday Jul 15 2016 22:48:41 EDT Ventricular Rate:  54 PR Interval:  146 QRS Duration: 72 QT Interval:  424 QTC Calculation: 402 R Axis:   -6 Text Interpretation:  Sinus bradycardia Moderate voltage criteria for LVH, may be normal variant Nonspecific T wave abnormality Abnormal ECG agree. no change from previous Confirmed by Charlesetta Shanks [phone]) on 07/16/2016 3:59:33 PM       Radiology Dg Chest 2 View  Result Date: 07/15/2016 CLINICAL DATA:  Sudden onset of chest pain tonight. EXAM: CHEST  2 VIEW COMPARISON:  Radiographs and CT 10/11/2012 FINDINGS: The cardiomediastinal contours are normal. Linear scarring in the lingula. Subsegmental atelectasis at both lung bases. Pulmonary vasculature is normal. No consolidation, pleural effusion, or pneumothorax. No acute osseous abnormalities are seen. IMPRESSION: Subsegmental bibasilar atelectasis. Electronically Signed   By: Jeb Levering M.D.   On: 07/15/2016 23:30    Procedures Procedures (including critical care time)  Medications Ordered in ED Medications  morphine 4 MG/ML injection 4 mg (4 mg Intravenous Given 07/16/16 0149)  ketorolac (TORADOL) 30 MG/ML injection 30 mg (30 mg Intravenous Given 07/16/16 0149)  LORazepam (ATIVAN) injection 0.5 mg (0.5 mg Intravenous Given 07/16/16 0149)     Initial Impression / Assessment and Plan / ED Course  I have reviewed the triage vital signs and the nursing notes.  Pertinent labs & imaging results that were available during my care of the patient were reviewed by me and considered in my medical decision making (see chart for details).     Suspect more GI related symptoms.  D-dimer and troponin 2 are negative.  Feels much better.  Discharge home in good condition.  Primary care doctor  follow-up.  Patient understands return to the ER for new or worsening symptoms  Final Clinical Impressions(s) / ED Diagnoses   Final diagnoses:  Precordial pain  Acute thoracic back pain, unspecified back pain laterality    New Prescriptions Discharge Medication List as of 07/16/2016  3:59 AM      I personally performed the services described in this documentation, which was scribed in my presence. The recorded information has been reviewed and is accurate.       Jola Schmidt, MD 07/16/16 [phone]

## 2016-07-16 NOTE — ED Notes (Signed)
Campos MD at bedside. 

## 2016-07-16 NOTE — ED Notes (Signed)
I-stat trop never ran when pt in triage, recollected and sent to minilab

## 2018-03-15 ENCOUNTER — Encounter (HOSPITAL_COMMUNITY): Payer: Self-pay

## 2018-03-15 ENCOUNTER — Emergency Department (HOSPITAL_COMMUNITY)
Admission: EM | Admit: 2018-03-15 | Discharge: 2018-03-15 | Disposition: A | Discharge status: Home / Self Care | Payer: Self-pay | Attending: Emergency Medicine | Admitting: Emergency Medicine

## 2018-03-15 ENCOUNTER — Emergency Department (HOSPITAL_COMMUNITY): Payer: Self-pay

## 2018-03-15 DIAGNOSIS — Z79899 Other long term (current) drug therapy: Secondary | ICD-10-CM | POA: Insufficient documentation

## 2018-03-15 DIAGNOSIS — I10 Essential (primary) hypertension: Secondary | ICD-10-CM | POA: Insufficient documentation

## 2018-03-15 DIAGNOSIS — Y929 Unspecified place or not applicable: Secondary | ICD-10-CM | POA: Insufficient documentation

## 2018-03-15 DIAGNOSIS — S90119A Contusion of unspecified great toe without damage to nail, initial encounter: Secondary | ICD-10-CM

## 2018-03-15 DIAGNOSIS — Y999 Unspecified external cause status: Secondary | ICD-10-CM | POA: Insufficient documentation

## 2018-03-15 DIAGNOSIS — Y939 Activity, unspecified: Secondary | ICD-10-CM | POA: Insufficient documentation

## 2018-03-15 DIAGNOSIS — S90112A Contusion of left great toe without damage to nail, initial encounter: Secondary | ICD-10-CM | POA: Insufficient documentation

## 2018-03-15 DIAGNOSIS — X58XXXA Exposure to other specified factors, initial encounter: Secondary | ICD-10-CM | POA: Insufficient documentation

## 2018-03-15 DIAGNOSIS — Z87891 Personal history of nicotine dependence: Secondary | ICD-10-CM | POA: Insufficient documentation

## 2018-03-15 NOTE — ED Provider Notes (Signed)
[facility] EMERGENCY DEPARTMENT Provider Note   CSN: [id number] Arrival date & time: 03/15/18  9024     History   Chief Complaint Chief Complaint  Patient presents with  . Foot Pain    HPI Katherine Henson is a 58 y.o. female.  The history is provided by the patient. No language interpreter was used.  Foot Pain  This is a new problem. The current episode started yesterday. The problem occurs constantly. The problem has not changed since onset.Nothing aggravates the symptoms. Nothing relieves the symptoms. She has tried nothing for the symptoms. The treatment provided no relief.   Pt reports she has a bruised swollen area left foot.  1st toe,  Past Medical History:  Diagnosis Date  . HTN (hypertension)     There are no active problems to display for this patient.   History reviewed. No pertinent surgical history.   OB History   No obstetric history on file.      Home Medications    Prior to Admission medications   Medication Sig Start Date End Date Taking? Authorizing Provider  albuterol (PROVENTIL HFA;VENTOLIN HFA) 108 (90 BASE) MCG/ACT inhaler Inhale 2 puffs into the lungs every 4 (four) hours as needed. For shortness of breath    [provider]  ibuprofen (ADVIL,MOTRIN) 200 MG tablet Take 400 mg by mouth every 6 (six) hours as needed for mild pain.     [provider]  metoprolol tartrate (LOPRESSOR) 25 MG tablet Take 25 mg by mouth 2 (two) times daily. 08/03/11   Delos Haring, PA-C    Family History History reviewed. No pertinent family history.  Social History Social History   Tobacco Use  . Smoking status: Former Smoker    Last attempt to quit: 12/06/2010    Years since quitting: 7.2  Substance Use Topics  . Alcohol use: Yes  . Drug use: No     Allergies   Penicillins   Review of Systems Review of Systems  Skin: Positive for color change.  All other systems reviewed and are negative.    Physical  Exam Updated Vital Signs BP (!) 160/21 (BP Location: Right Arm)   Pulse 99   Temp (!) 97.5 F (36.4 C) (Oral)   Resp 20   LMP 09/27/2012   SpO2 99%   Physical Exam Vitals signs and nursing note reviewed.  Constitutional:      Appearance: Normal appearance.  HENT:     Head: Normocephalic.  Musculoskeletal:        General: Swelling and tenderness present.     Comments: Bruised swollen 1st toe, minimally tender  nv and ns intact  Skin:    General: Skin is warm.  Neurological:     General: No focal deficit present.     Mental Status: She is alert.  Psychiatric:        Mood and Affect: Mood normal.      ED Treatments / Results  Labs (all labs ordered are listed, but only abnormal results are displayed) Labs Reviewed - No data to display  EKG None  Radiology Dg Foot Complete Left  Result Date: 03/15/2018 CLINICAL DATA:  Left great toe pain swelling and bruising. EXAM: LEFT FOOT - COMPLETE 3+ VIEW COMPARISON:  07/27/2009 FINDINGS: There is no evidence of fracture or dislocation. There is no evidence of arthropathy or other focal bone abnormality. Soft tissues are unremarkable. IMPRESSION: Negative. Electronically Signed   By: Fidela Salisbury M.D.   On: 03/15/2018  10:39    Procedures Procedures (including critical care time)  Medications Ordered in ED Medications - No data to display   Initial Impression / Assessment and Plan / ED Course  I have reviewed the triage vital signs and the nursing notes.  Pertinent labs & imaging results that were available during my care of the patient were reviewed by me and considered in my medical decision making (see chart for details).       Final Clinical Impressions(s) / ED Diagnoses   Final diagnoses:  Contusion of great toe without damage to nail, unspecified laterality, initial encounter    ED Discharge Orders    None    An After Visit Summary was printed and given to the patient.    Fransico Meadow,  [place] 03/15/18 1159    Gareth Morgan, MD 03/16/18 1029

## 2018-03-15 NOTE — ED Notes (Signed)
Declined W/C at D/C and was escorted to lobby by RN. 

## 2018-03-15 NOTE — ED Triage Notes (Signed)
Pt endorses left foot pain around the great toe since yesterday. Bruising noted to big toe. PMS intact. Able to move foot and toes. Denies fever or chills. Denies injury. Able to bear weight.

## 2018-03-15 NOTE — Discharge Instructions (Signed)
Return if any problems.

## 2018-04-17 IMAGING — CR DG CHEST 2V
2 series · 2 of 2 positions shown · non-contrast
Comparison: Radiographs and CT 10/11/2012

CLINICAL DATA: Sudden onset of chest pain tonight.

EXAM:
CHEST  2 VIEW

[chest pa]
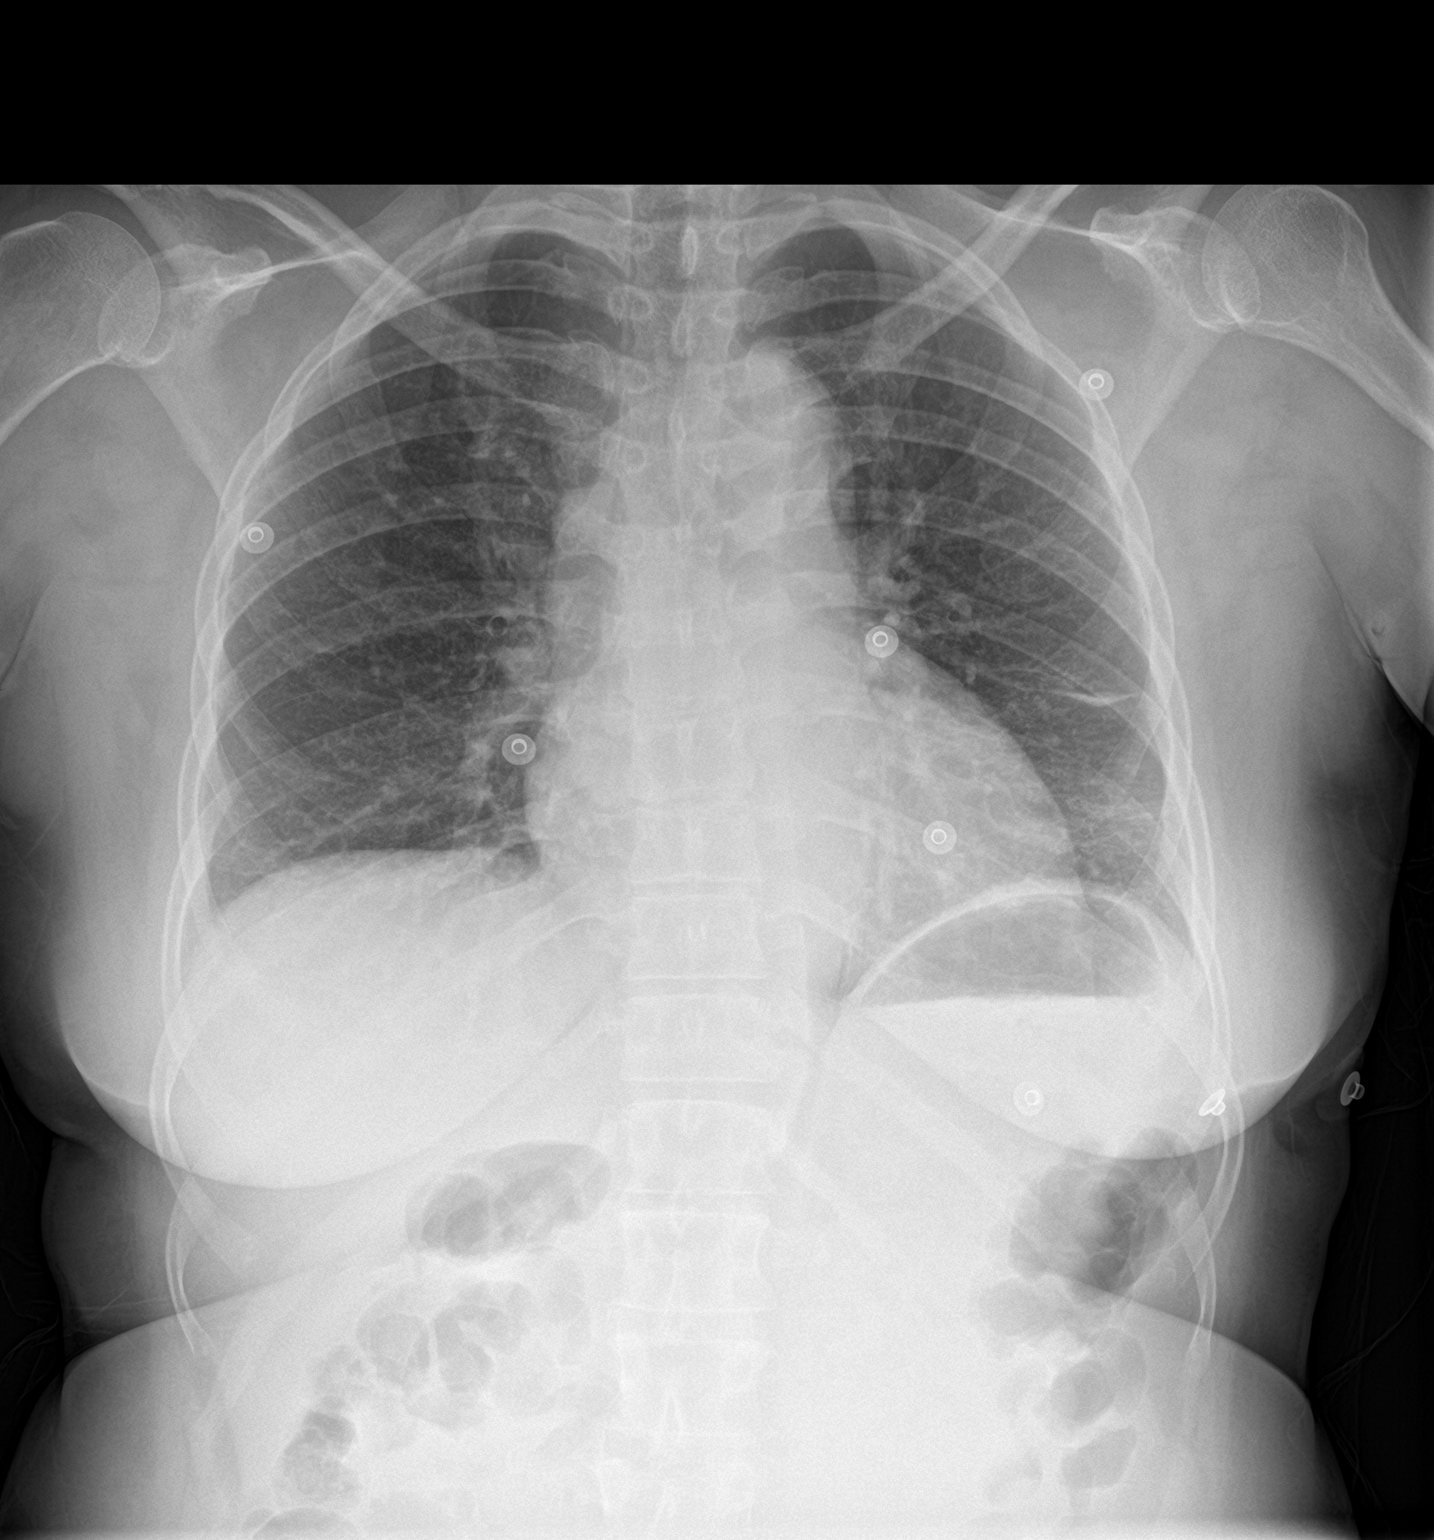

[chest lat]
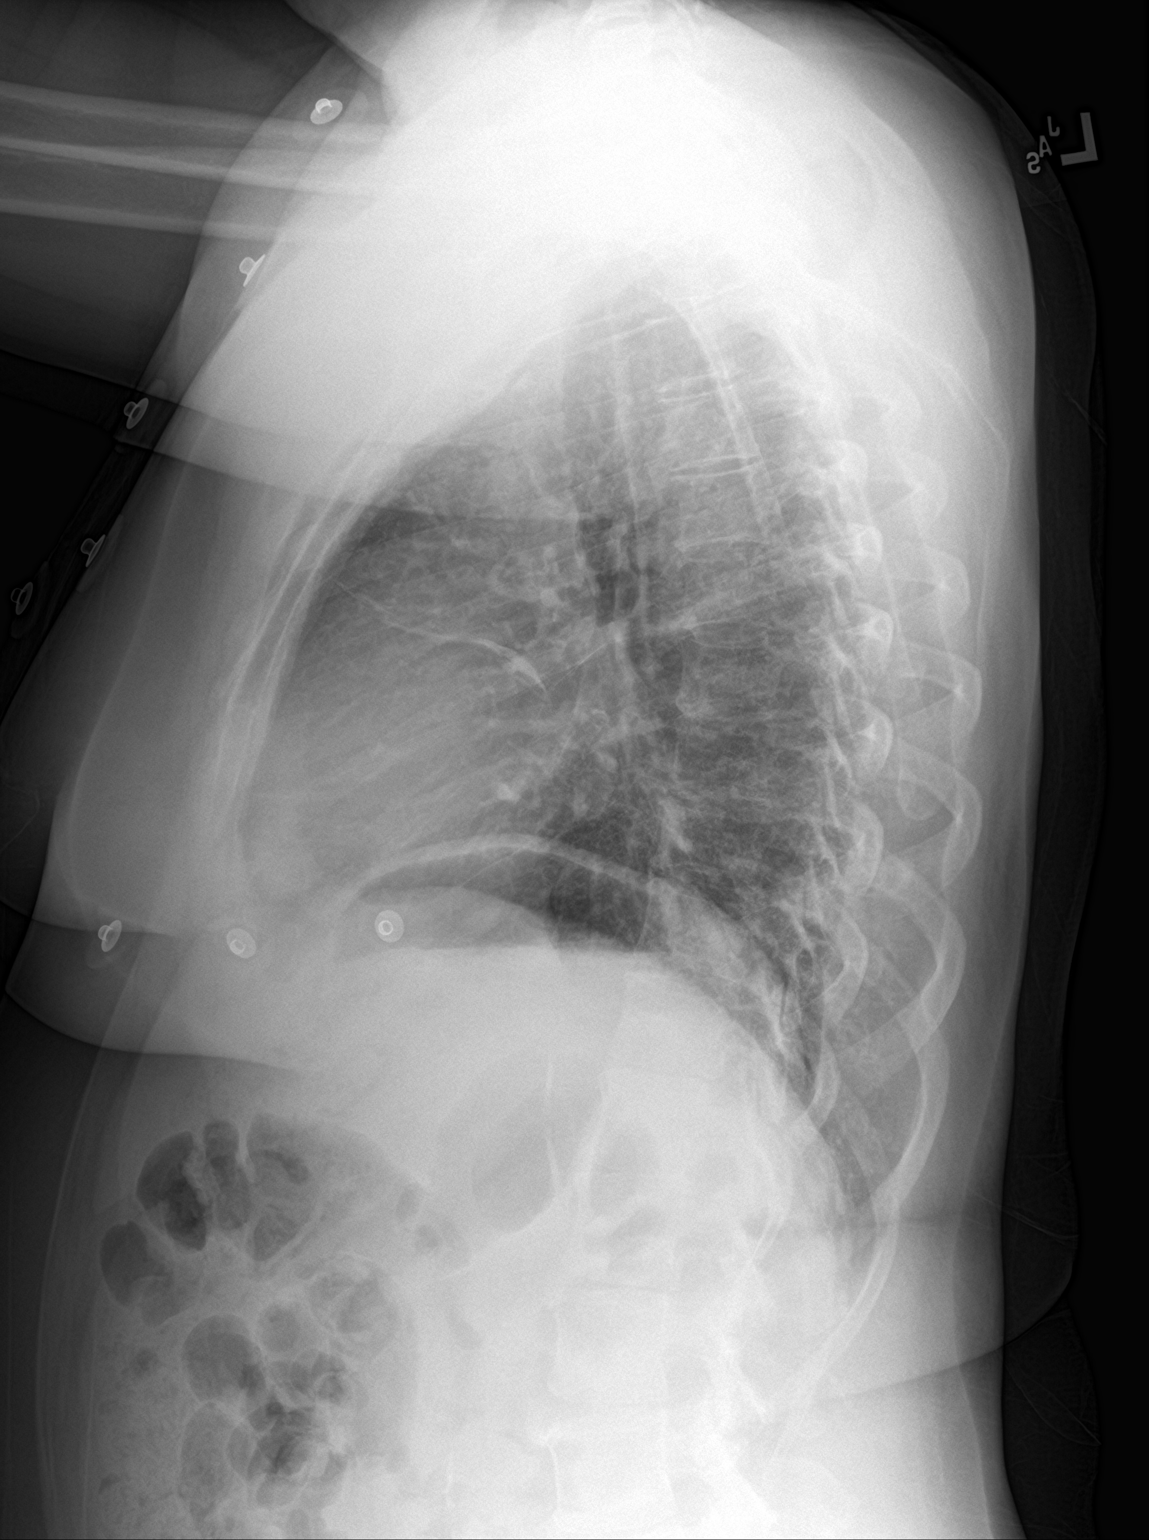

[2 of 2 positions shown; findings below may reference images not displayed]

FINDINGS: The cardiomediastinal contours are normal. Linear scarring in the
lingula. Subsegmental atelectasis at both lung bases. Pulmonary
vasculature is normal. No consolidation, pleural effusion, or
pneumothorax. No acute osseous abnormalities are seen.
IMPRESSION: Subsegmental bibasilar atelectasis.

## 2018-04-30 ENCOUNTER — Other Ambulatory Visit: Payer: Self-pay

## 2018-04-30 ENCOUNTER — Inpatient Hospital Stay (HOSPITAL_COMMUNITY)
Admission: EM | Admit: 2018-04-30 | Discharge: 2018-05-03 | DRG: 072 | Disposition: A | Discharge status: Home / Self Care | Payer: Self-pay | Attending: Family Medicine | Admitting: Family Medicine

## 2018-04-30 ENCOUNTER — Emergency Department (HOSPITAL_COMMUNITY): Payer: Self-pay

## 2018-04-30 ENCOUNTER — Observation Stay (HOSPITAL_COMMUNITY): Payer: Self-pay

## 2018-04-30 ENCOUNTER — Encounter (HOSPITAL_COMMUNITY): Payer: Self-pay | Admitting: Emergency Medicine

## 2018-04-30 DIAGNOSIS — R0600 Dyspnea, unspecified: Secondary | ICD-10-CM

## 2018-04-30 DIAGNOSIS — I6783 Posterior reversible encephalopathy syndrome: Principal | ICD-10-CM | POA: Diagnosis present

## 2018-04-30 DIAGNOSIS — H53419 Scotoma involving central area, unspecified eye: Secondary | ICD-10-CM

## 2018-04-30 DIAGNOSIS — Z23 Encounter for immunization: Secondary | ICD-10-CM

## 2018-04-30 DIAGNOSIS — R079 Chest pain, unspecified: Secondary | ICD-10-CM

## 2018-04-30 DIAGNOSIS — R42 Dizziness and giddiness: Secondary | ICD-10-CM

## 2018-04-30 DIAGNOSIS — H509 Unspecified strabismus: Secondary | ICD-10-CM | POA: Diagnosis present

## 2018-04-30 DIAGNOSIS — N3000 Acute cystitis without hematuria: Secondary | ICD-10-CM

## 2018-04-30 DIAGNOSIS — Z79899 Other long term (current) drug therapy: Secondary | ICD-10-CM

## 2018-04-30 DIAGNOSIS — I10 Essential (primary) hypertension: Secondary | ICD-10-CM | POA: Diagnosis present

## 2018-04-30 DIAGNOSIS — I160 Hypertensive urgency: Secondary | ICD-10-CM | POA: Diagnosis present

## 2018-04-30 DIAGNOSIS — Z880 Allergy status to penicillin: Secondary | ICD-10-CM

## 2018-04-30 DIAGNOSIS — Z87891 Personal history of nicotine dependence: Secondary | ICD-10-CM

## 2018-04-30 LAB — COMPREHENSIVE METABOLIC PANEL
ALT: 21 U/L (ref 0–44)
AST: 28 U/L (ref 15–41)
Albumin: 3.3 g/dL — ABNORMAL LOW (ref 3.5–5.0)
Alkaline Phosphatase: 80 U/L (ref 38–126)
Anion gap: 7 (ref 5–15)
BUN: 12 mg/dL (ref 6–20)
CALCIUM: 9.4 mg/dL (ref 8.9–10.3)
CHLORIDE: 110 mmol/L (ref 98–111)
CO2: 23 mmol/L (ref 22–32)
CREATININE: 0.83 mg/dL (ref 0.44–1.00)
GFR calc Af Amer: >60 mL/min (ref >60)
Glucose, Bld: 125 mg/dL — ABNORMAL HIGH (ref 70–99)
Potassium: 3.8 mmol/L (ref 3.5–5.1)
Sodium: 140 mmol/L (ref 135–145)
Total Bilirubin: 0.3 mg/dL (ref 0.3–1.2)
Total Protein: 6.5 g/dL (ref 6.5–8.1)

## 2018-04-30 LAB — URINALYSIS, ROUTINE W REFLEX MICROSCOPIC
Bilirubin Urine: NEGATIVE
Glucose, UA: NEGATIVE mg/dL
Ketones, ur: NEGATIVE mg/dL
Nitrite: NEGATIVE
Protein, ur: NEGATIVE mg/dL
SPECIFIC GRAVITY, URINE: 1.013 (ref 1.005–1.030)
pH: 6 (ref 5.0–8.0)

## 2018-04-30 LAB — TROPONIN I
Troponin I: <0.03 ng/mL (ref <0.03)
Troponin I: <0.03 ng/mL (ref <0.03)
Troponin I: <0.03 ng/mL (ref <0.03)

## 2018-04-30 LAB — CBC WITH DIFFERENTIAL/PLATELET
Abs Immature Granulocytes: 0.01 10*3/uL (ref 0.00–0.07)
Basophils Absolute: 0 10*3/uL (ref 0.0–0.1)
Basophils Relative: 1 %
EOS ABS: 0.2 10*3/uL (ref 0.0–0.5)
EOS PCT: 2 %
HEMATOCRIT: 38.7 % (ref 36.0–46.0)
HEMOGLOBIN: 11.5 g/dL — AB (ref 12.0–15.0)
Immature Granulocytes: 0 %
LYMPHS ABS: 1.9 10*3/uL (ref 0.7–4.0)
LYMPHS PCT: 29 %
MCH: 26.6 pg (ref 26.0–34.0)
MCHC: 29.7 g/dL — AB (ref 30.0–36.0)
MCV: 89.6 fL (ref 80.0–100.0)
MONO ABS: 0.5 10*3/uL (ref 0.1–1.0)
Monocytes Relative: 7 %
Neutro Abs: 3.9 10*3/uL (ref 1.7–7.7)
Neutrophils Relative %: 61 %
Platelets: 249 10*3/uL (ref 150–400)
RBC: 4.32 MIL/uL (ref 3.87–5.11)
RDW: 14.4 % (ref 11.5–15.5)
WBC: 6.6 10*3/uL (ref 4.0–10.5)
nRBC: 0 % (ref 0.0–0.2)

## 2018-04-30 MED ORDER — DIPHENHYDRAMINE HCL 50 MG/ML IJ SOLN
12.5000 mg | Freq: Once | INTRAMUSCULAR | Status: DC
  Filled 2018-04-30: qty 1

## 2018-04-30 MED ORDER — ENOXAPARIN SODIUM 40 MG/0.4ML ~~LOC~~ SOLN
40.0000 mg | SUBCUTANEOUS | Status: DC
  Administered 2018-04-30 – 2018-05-02 (×3): 40 mg via SUBCUTANEOUS
  Filled 2018-04-30 (×3): qty 0.4

## 2018-04-30 MED ORDER — PROCHLORPERAZINE EDISYLATE 10 MG/2ML IJ SOLN
10.0000 mg | Freq: Once | INTRAMUSCULAR | Status: DC
  Filled 2018-04-30: qty 2

## 2018-04-30 MED ORDER — IBUPROFEN 200 MG PO TABS: 200.0000 mg | ORAL_TABLET | Freq: Four times a day (QID) | ORAL | Status: DC | PRN

## 2018-04-30 MED ORDER — SODIUM CHLORIDE 0.9 % IV BOLUS
500.0000 mL | Freq: Once | INTRAVENOUS | Status: AC
  Administered 2018-04-30: 500 mL via INTRAVENOUS

## 2018-04-30 MED ORDER — ONDANSETRON HCL 4 MG/2ML IJ SOLN: 4.0000 mg | Freq: Four times a day (QID) | INTRAMUSCULAR | Status: DC | PRN

## 2018-04-30 MED ORDER — METOPROLOL TARTRATE 25 MG PO TABS
25.0000 mg | ORAL_TABLET | Freq: Two times a day (BID) | ORAL | Status: DC
  Administered 2018-04-30 – 2018-05-03 (×7): 25 mg via ORAL
  Filled 2018-04-30 (×7): qty 1

## 2018-04-30 MED ORDER — METOPROLOL TARTRATE 5 MG/5ML IV SOLN
5.0000 mg | Freq: Once | INTRAVENOUS | Status: AC
  Administered 2018-04-30: 5 mg via INTRAVENOUS
  Filled 2018-04-30: qty 5

## 2018-04-30 MED ORDER — ACETAMINOPHEN 325 MG PO TABS
650.0000 mg | ORAL_TABLET | ORAL | Status: DC | PRN
  Administered 2018-04-30 – 2018-05-02 (×3): 650 mg via ORAL
  Filled 2018-04-30 (×3): qty 2

## 2018-04-30 NOTE — ED Notes (Signed)
Patient transported to X-ray 

## 2018-04-30 NOTE — ED Triage Notes (Addendum)
Pt in from home with c/o dizziness, HA and chest tightness since 8pm last night. States she woke with all but HA, and also has sob this am. States chest pressure 7/10, worse with exertion. C/o "black spots" both eyes

## 2018-04-30 NOTE — ED Provider Notes (Signed)
[facility] EMERGENCY DEPARTMENT Provider Note   CSN: [id number] Arrival date & time: 04/30/18  0938    History   Chief Complaint Chief Complaint  Patient presents with  . Dizziness  . Shortness of Breath  . Chest Pain    HPI Katherine Henson is a 58 y.o. female.  HPI: A 58 year old patient with a history of hypertension presents for evaluation of chest pain. Initial onset of pain was approximately 1-3 hours ago. The patient's chest pain is described as heaviness/pressure/tightness and is worse with exertion. The patient's chest pain is middle- or left-sided, is not well-localized, is not sharp and does not radiate to the arms/jaw/neck. The patient does not complain of nausea and denies diaphoresis. The patient has smoked in the past 90 days. The patient has no history of stroke, has no history of peripheral artery disease, denies any history of treated diabetes, has no relevant family history of coronary artery disease (first degree relative at less than age 77), has no history of hypercholesterolemia and does not have an elevated BMI (>=30).   Patient is a 58 year old female with a history of hypertension who presents with dizziness.  She feels lightheaded.  She does not have any spinning sensation.  She says it started last night and she is also been seeing intermittent flashing lights in both of her eyes.  They move around to different spots.  She denies any vision changes.  She is felt a little short of breath since this morning and has some chest pressure across her chest that also started last night.  She tends to get more short of breath when she is walking around.  She denies any leg pain or swelling.  No cough or chest congestion.  No known fevers.  She had a little bit of a headache last night but denies any currently.  She has no speech deficits.  No vision changes other than the flashing lights.  She is moving all of her extremities normally with no numbness or  weakness to her extremities.     Past Medical History:  Diagnosis Date  . HTN (hypertension)     There are no active problems to display for this patient.   History reviewed. No pertinent surgical history.   OB History   No obstetric history on file.      Home Medications    Prior to Admission medications   Medication Sig Start Date End Date Taking? Authorizing Provider  ibuprofen (ADVIL,MOTRIN) 200 MG tablet Take 200 mg by mouth every 6 (six) hours as needed for mild pain.    Yes [provider]  metoprolol tartrate (LOPRESSOR) 25 MG tablet Take 25 mg by mouth 2 (two) times daily. 08/03/11   Delos Haring, PA-C    Family History No family history on file.  Social History Social History   Tobacco Use  . Smoking status: Former Smoker    Last attempt to quit: 12/06/2010    Years since quitting: 7.4  . Smokeless tobacco: Never Used  Substance Use Topics  . Alcohol use: Yes  . Drug use: No     Allergies   Penicillins   Review of Systems Review of Systems  Constitutional: Negative for chills, diaphoresis, fatigue and fever.  HENT: Negative for congestion, rhinorrhea and sneezing.   Eyes: Positive for visual disturbance.  Respiratory: Positive for shortness of breath. Negative for cough and chest tightness.   Cardiovascular: Positive for chest pain. Negative for leg swelling.  Gastrointestinal: Negative  for abdominal pain, blood in stool, diarrhea, nausea and vomiting.  Genitourinary: Negative for difficulty urinating, flank pain, frequency and hematuria.  Musculoskeletal: Negative for arthralgias and back pain.  Skin: Negative for rash.  Neurological: Positive for light-headedness. Negative for dizziness, speech difficulty, weakness, numbness and headaches.     Physical Exam Updated Vital Signs BP (!) 148/107   Pulse 78   Temp (!) 97.3 F (36.3 C) (Oral)   Resp (!) 21   Wt 68 kg   LMP 09/27/2012   SpO2 97%   BMI 29.29 kg/m   Physical  Exam Constitutional:      Appearance: She is well-developed.  HENT:     Head: Normocephalic and atraumatic.  Eyes:     Pupils: Pupils are equal, round, and reactive to light.     Comments: Patient has lack of lateral movement of the right eye which is baseline for her.  Visual fields full to confrontation  Neck:     Musculoskeletal: Normal range of motion and neck supple.  Cardiovascular:     Rate and Rhythm: Normal rate and regular rhythm.     Heart sounds: Normal heart sounds.  Pulmonary:     Effort: Pulmonary effort is normal. No respiratory distress.     Breath sounds: Normal breath sounds. No wheezing or rales.  Chest:     Chest wall: No tenderness.  Abdominal:     General: Bowel sounds are normal.     Palpations: Abdomen is soft.     Tenderness: There is no abdominal tenderness. There is no guarding or rebound.  Musculoskeletal: Normal range of motion.  Lymphadenopathy:     Cervical: No cervical adenopathy.  Skin:    General: Skin is warm and dry.     Findings: No rash.  Neurological:     Mental Status: She is alert and oriented to person, place, and time.     Comments: Motor 5/5 all extremities Sensation grossly intact to LT all extremities Finger to Nose intact, no pronator drift CN II-XII grossly intact        ED Treatments / Results  Labs (all labs ordered are listed, but only abnormal results are displayed) Labs Reviewed  COMPREHENSIVE METABOLIC PANEL - Abnormal; Notable for the following components:      Result Value   Glucose, Bld 125 (*)    Albumin 3.3 (*)    All other components within normal limits  CBC WITH DIFFERENTIAL/PLATELET - Abnormal; Notable for the following components:   Hemoglobin 11.5 (*)    MCHC 29.7 (*)    All other components within normal limits  URINALYSIS, ROUTINE W REFLEX MICROSCOPIC - Abnormal; Notable for the following components:   APPearance HAZY (*)    Hgb urine dipstick SMALL (*)    Leukocytes,Ua LARGE (*)    Bacteria,  UA RARE (*)    All other components within normal limits  TROPONIN I    EKG EKG Interpretation  Date/Time:  Monday April 30 2018 09:29:00 EST Ventricular Rate:  87 PR Interval:    QRS Duration: 75 QT Interval:  361 QTC Calculation: 435 R Axis:   12 Text Interpretation:  Sinus rhythm Probable left atrial enlargement Abnormal R-wave progression, early transition Probable left ventricular hypertrophy since last tracing no significant change Confirmed by Malvin Johns [phone]) on 04/30/2018 9:31:58 AM   Radiology Dg Chest 2 View  Result Date: 04/30/2018 CLINICAL DATA:  Dizziness.  Chest tightness.  Shortness of breath. EXAM: CHEST - 2 VIEW COMPARISON:  Jul 15, 2016 FINDINGS: Mild opacity in left base is unchanged, likely scarring or chronic atelectasis. The heart, hila, mediastinum, lungs, and pleura are otherwise unremarkable. IMPRESSION: Chronic mild opacity in left base, likely scar atelectasis. No other acute abnormalities. Electronically Signed   By: Dorise Bullion III M.D   On: 04/30/2018 10:45    Procedures Procedures (including critical care time)  Medications Ordered in ED Medications  sodium chloride 0.9 % bolus 500 mL (500 mLs Intravenous New Bag/Given 04/30/18 1130)     Initial Impression / Assessment and Plan / ED Course  I have reviewed the triage vital signs and the nursing notes.  Pertinent labs & imaging results that were available during my care of the patient were reviewed by me and considered in my medical decision making (see chart for details).     HEAR Score: 4  Patient is a 58 year old female who presents with dizziness and flashing lights in her eyes.  She has associated chest pain or shortness of breath.  She currently is chest pain-free although she has had intermittent episodes of chest pain during her ED stay.  She has no ischemic changes on EKG.  Her troponin is negative.  She has some suggestions of a UTI on her urinalysis.  Her dizziness has  improved although she still had some episodes of chest pain.  Given her elevated heart score, I will consult medicine for admission.  I spoke with Dr. Steffanie Dunn who will admit the pt.  Final Clinical Impressions(s) / ED Diagnoses   Final diagnoses:  Dizziness  Acute cystitis without hematuria  Chest pain, unspecified type    ED Discharge Orders    None       Malvin Johns, MD 04/30/18 1407

## 2018-04-30 NOTE — H&P (Signed)
History and Physical    Kerrie O Henson VQM:[id number] DOB: 1960-07-30 DOA: 04/30/2018  PCP: Patient, No Pcp Per Consultants: None Patient coming from: Home- lives alone  Chief Complaint: Shortness of breath  HPI: Katherine Henson is a 58 y.o. female with medical history significant for essential hypertension who presented to the ED today with c/o shortness of breath and dizziness.  Patient states that she was in her usual state of health until last night before bed when she had the relatively acute onset of shortness of breath, dizziness and intermittent chest tightness.  She denies actual chest pain.  She states that it feels like something is pushing down on her chest.  Sometime overnight she developed "flashing lights" which are worse when she sits upright and better when she lies down.  She has had a mild right temporal headache since last night as well.  She denies decrease in her visual acuity or blurred vision.  Patient is a difficult historian.  She states that she does check her blood pressure at the drugstore and that it is always normal but when I asked her what normal was she was not able to tell me.  She states that she was told to stop her hydrochlorothiazide and changed to metoprolol.  She says that this was ordered by a cardiologist from [facility] but she does not know his name and I cannot find any records of this in epic.  She says she took the last dose of hydrochlorothiazide yesterday morning and was supposed to start the metoprolol today but has not gotten the prescription filled yet.  She says that she has been tried on at least 4 different medications for blood pressure and that they always seem to work at first and then stop working.  When I asked her who her PCP is she was not aware of having one. Most of her encounters in Epic are ED visits.   ED Course: Initial troponin negative, EKG reassuring. HEART score 4. Continued to c/o flashing lights in her vision, dyspnea. She was  hypertensive.   Review of Systems: As per HPI; otherwise review of systems reviewed and negative. Specifically no urinary frequency, no dysuria, no abdominal / suprapubic pain  Ambulatory Status:  Ambulates without assistance  Past Medical History:  Diagnosis Date  . HTN (hypertension)     History reviewed. No pertinent surgical history.  Social History   Socioeconomic History  . Marital status: Single    Spouse name: Not on file  . Number of children: Not on file  . Years of education: Not on file  . Highest education level: Not on file  Occupational History  . Not on file  Social Needs  . Financial resource strain: Not on file  . Food insecurity:    Worry: Not on file    Inability: Not on file  . Transportation needs:    Medical: Not on file    Non-medical: Not on file  Tobacco Use  . Smoking status: Former Smoker    Last attempt to quit: 12/06/2010    Years since quitting: 7.4  . Smokeless tobacco: Never Used  Substance and Sexual Activity  . Alcohol use: Yes  . Drug use: No  . Sexual activity: Not on file  Lifestyle  . Physical activity:    Days per week: Not on file    Minutes per session: Not on file  . Stress: Not on file  Relationships  . Social connections:    Talks on  phone: Not on file    Gets together: Not on file    Attends religious service: Not on file    Active member of club or organization: Not on file    Attends meetings of clubs or organizations: Not on file    Relationship status: Not on file  . Intimate partner violence:    Fear of current or ex partner: Not on file    Emotionally abused: Not on file    Physically abused: Not on file    Forced sexual activity: Not on file  Other Topics Concern  . Not on file  Social History Narrative  . Not on file    Allergies  Allergen Reactions  . Penicillins Anaphylaxis    Did it involve swelling of the face/tongue/throat, SOB, or low BP? No Did it involve sudden or severe rash/hives, skin  peeling, or any reaction on the inside of your mouth or nose? Yes Did you need to seek medical attention at a hospital or doctor's office? No When did it last happen?30 years ago If all above answers are "NO", may proceed with cephalosporin use.    No family history on file.  Prior to Admission medications   Medication Sig Start Date End Date Taking? Authorizing Provider  ibuprofen (ADVIL,MOTRIN) 200 MG tablet Take 200 mg by mouth every 6 (six) hours as needed for mild pain.    Yes [provider]  metoprolol tartrate (LOPRESSOR) 25 MG tablet Take 25 mg by mouth 2 (two) times daily. 08/03/11   Delos Haring, PA-C    Physical Exam: Vitals:   04/30/18 0932 04/30/18 0945 04/30/18 1000 04/30/18 1045  BP:  (!) 136/95 (!) 143/100 (!) 148/107  Pulse:  84 71 78  Resp:  16 13 (!) 21  Temp: (!) 97.3 F (36.3 C)     TempSrc: Oral     SpO2:  99% 96% 97%  Weight:         . General: Appears calm and comfortable and is in NAD . 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 ENT:  grossly normal hearing, lips & tongue, mmm . Neck:  supple, no lymphadenopathy . Cardiovascular:  nL S1, S2, normal rate, reg rhythm, no murmur. Marland Kitchen Respiratory:   CTA bilaterally with no wheezes/rales/rhonchi.  Normal respiratory effort. . Abdomen:  soft, NT, ND, NABS; no bruits . Back:   grossly normal alignment . Skin:  no rash or lesions seen on limited exam . Musculoskeletal:  grossly normal tone BUE/BLE, good ROM, no bony abnormality or obvious joint deformity . Lower extremities:  Trace LE edema.  Limited foot exam with no ulcerations.  2+ distal pulses. Marland Kitchen Psychiatric:  grossly normal mood and affect, speech fluent and appropriate, AOx3 . Neurologic: Vision grossly intact; pt can count fingers from approximately 15 feet away. CN 2-12 grossly intact, moves all extremities in coordinated fashion, sensation intact, Patellar DTRs 2+ and symmetric   Radiological Exams on  Admission: Dg Chest 2 View  Result Date: 04/30/2018 CLINICAL DATA:  Dizziness.  Chest tightness.  Shortness of breath. EXAM: CHEST - 2 VIEW COMPARISON:  Jul 15, 2016 FINDINGS: Mild opacity in left base is unchanged, likely scarring or chronic atelectasis. The heart, hila, mediastinum, lungs, and pleura are otherwise unremarkable. IMPRESSION: Chronic mild opacity in left base, likely scar atelectasis. No other acute abnormalities. Electronically Signed   By: Dorise Bullion III M.D   On: 04/30/2018 10:45    EKG: Independently reviewed.  Date/Time:                  Monday April 30 2018 09:29:00 EST Ventricular Rate:         87 PR Interval:                   QRS Duration: 75 QT Interval:                 361 QTC Calculation:        435 R Axis:                         12 Text Interpretation:       Sinus rhythm Probable left atrial enlargement Abnormal R-wave progression, early transition Probable left ventricular hypertrophy since last tracing no significant change Confirmed by Malvin Johns [id number]) on 04/30/2018 9:31:58 AM    Labs on Admission: I have personally reviewed the available labs and imaging studies at the time of the admission.  Pertinent labs:  CMP unremarkable TnI <0.03 CBC unremarkable; Hgb 11.5, about one point below baseline U/A: neg nitrite, small Hgb, 6-10 WBC   Assessment/Plan Principal Problem:   Hypertensive urgency Active Problems:   HTN (hypertension)   Chest pain   Scotoma   Dyspnea    Hypertensive urgency: with concern for possible PRES given headache and visual disturbance. Have spoken with neurology by phone who recommended inpatient head CT to r/o bleed and outpatient workup to include MRI to look for findings of PRES. Pt would benefit from better education regarding her health conditions and treatment plans. According to her she was to start metoprolol 25 mg BID today (finished HCTZ yesterday); she is unclear why the change is being made.  -given  tachycardia and continued HTN will give lopressor 5 mg IV and assess response. May give additional doses as needed -continue metoprolol 25 mg BID -will likely need addition of a second agent, such as norvasc or ACE/ARB. She states she's been on at least 4 BP meds in the past but we have no records of this. Need to attempt to obtain outpatient records.  Chest tightness, dyspnea -R/o acs: obtain delta troponin, EKG in a.m. -negative pharmacologic nuclear stress in 2011. TTE at that time showed EF 60-65%, grade 2 DD, LV wall thickness upper limits of normal. No cardiac eval since that time.  -check HbAIC, FLP in a.m. -likely related to her HTN as well. No evidence of PNA, no h/o COPD/asthma.  Headache, visual changes which are concerning given her severe HTN -head CT as above to r/o bleed -migraine cocktail -neuro recommended referral to [facility] as outpatient     DVT prophylaxis: lovenox Code Status:  Full - confirmed with patient/family Family Communication: son at bedside  Disposition Plan: Home once clinically improved Consults called: neuro (curbside)  Admission status: It is my clinical opinion that referral for OBSERVATION is reasonable and necessary in this patient based on the above information provided. The aforementioned taken together are felt to place the patient at high risk for further clinical deterioration. However it is anticipated that the patient may be medically stable for discharge from the hospital within 24 to 48 hours.     Janora Norlander MD [facility]  If note is complete, please contact covering daytime or nighttime physician. [email or web address] Password [email or web address]  04/30/2018, 2:10 PM

## 2018-04-30 NOTE — ED Notes (Signed)
RN informed ok for Pt to have visitor

## 2018-04-30 NOTE — ED Notes (Signed)
Katherine Henson (son) would like to be contacted when patient is moved to the floor. [phone]

## 2018-04-30 NOTE — ED Notes (Signed)
Patient transported to CT 

## 2018-05-01 DIAGNOSIS — Z9119 Patient's noncompliance with other medical treatment and regimen: Secondary | ICD-10-CM

## 2018-05-01 DIAGNOSIS — R079 Chest pain, unspecified: Secondary | ICD-10-CM

## 2018-05-01 LAB — LIPID PANEL
Cholesterol: 213 mg/dL — ABNORMAL HIGH (ref 0–200)
HDL: 46 mg/dL (ref >40)
LDL Cholesterol: 115 mg/dL — ABNORMAL HIGH (ref 0–99)
Total CHOL/HDL Ratio: 4.6 RATIO
Triglycerides: 258 mg/dL — ABNORMAL HIGH (ref <150)
VLDL: 52 mg/dL — ABNORMAL HIGH (ref 0–40)

## 2018-05-01 LAB — BASIC METABOLIC PANEL
Anion gap: 8 (ref 5–15)
BUN: 13 mg/dL (ref 6–20)
CO2: 23 mmol/L (ref 22–32)
Calcium: 9.2 mg/dL (ref 8.9–10.3)
Chloride: 108 mmol/L (ref 98–111)
Creatinine, Ser: 0.87 mg/dL (ref 0.44–1.00)
GFR calc Af Amer: >60 mL/min (ref >60)
GFR calc non Af Amer: >60 mL/min (ref >60)
Glucose, Bld: 108 mg/dL — ABNORMAL HIGH (ref 70–99)
Potassium: 3.9 mmol/L (ref 3.5–5.1)
Sodium: 139 mmol/L (ref 135–145)

## 2018-05-01 LAB — HIV ANTIBODY (ROUTINE TESTING W REFLEX): HIV Screen 4th Generation wRfx: NONREACTIVE

## 2018-05-01 LAB — HEMOGLOBIN A1C
Hgb A1c MFr Bld: 5.7 % — ABNORMAL HIGH (ref 4.8–5.6)
Mean Plasma Glucose: 116.89 mg/dL

## 2018-05-01 MED ORDER — AMLODIPINE BESYLATE 10 MG PO TABS
10.0000 mg | ORAL_TABLET | Freq: Every day | ORAL | Status: DC
  Administered 2018-05-01 – 2018-05-03 (×3): 10 mg via ORAL
  Filled 2018-05-01 (×3): qty 1

## 2018-05-01 NOTE — Progress Notes (Signed)
Progress Note    Katherine Henson  HCW:[id number] DOB: 10-12-1960  DOA: 04/30/2018 PCP: Patient, No Pcp Per    Brief Narrative:     Medical records reviewed and are as summarized below:  Katherine Henson is an 58 y.o. female with medical history significant for essential hypertension who presented to the ED today with c/o shortness of breath and dizziness.  Patient states that she was in her usual state of health until last night before bed when she had the relatively acute onset of shortness of breath, dizziness and intermittent chest tightness.  She states she takes metoprolol BID (prescribed in 2013) and she has not follow ups but she gets refills from "Dr. Tamala Julian".  No visits in Epic except PRN ER notes.  Assessment/Plan:   Principal Problem:   Hypertensive urgency Active Problems:   HTN (hypertension)   Chest pain   Scotoma   Dyspnea  Hypertensive urgency -concern for possible PRES given headache and visual disturbance. -admitting MD spoke with neurology by phone who recommended inpatient head CT to r/o bleed and outpatient workup to include MRI to look for findings of PRES.  -continue metoprolol 25 mg BID -added norvasc 10 mg -continue to add medications  Chest tightness, dyspnea -likely related to her uncontrolled HTN  Headache -due to uncontrolled BP -head CT negative for bleed -migraine cocktail -neuro recommended referral to [facility] as outpatient   Family Communication/Anticipated D/C date and plan/Code Status   DVT prophylaxis: Lovenox ordered. Code Status: Full Code.  Family Communication: patient Disposition Plan:    Medical Consultants:    None.    Subjective:   Says she has been taking metoprolol at home  Objective:    Vitals:   05/01/18 0128 05/01/18 0711 05/01/18 0712 05/01/18 0846  BP: (!) 172/100 (!) 152/106 (!) 152/106 (!) 165/136  Pulse: 83 78 79   Resp: 17     Temp:  98.2 F (36.8 C) 98.2 F (36.8 C)   TempSrc:   Oral Oral   SpO2:  94% 97%   Weight:  69.1 kg    Height:        Intake/Output Summary (Last 24 hours) at 05/01/2018 1055 Last data filed at 04/30/2018 1800 Gross per 24 hour  Intake 500 ml  Output -  Net 500 ml   Filed Weights   04/30/18 0930 04/30/18 1817 05/01/18 0711  Weight: 68 kg 69.4 kg 69.1 kg    Exam: In bed Alert but evasive with answers Moves all 4 ext rrr No increased work of breathing Right esotropia  Data Reviewed:   I have personally reviewed following labs and imaging studies:  Labs: Labs show the following:   Basic Metabolic Panel: Recent Labs  Lab 04/30/18 1004 05/01/18 0338  NA 140 139  K 3.8 3.9  CL 110 108  CO2 23 23  GLUCOSE 125* 108*  BUN 12 13  CREATININE 0.83 0.87  CALCIUM 9.4 9.2   GFR Estimated Creatinine Clearance: 61.1 mL/min (by C-G formula based on SCr of 0.87 mg/dL). Liver Function Tests: Recent Labs  Lab 04/30/18 1004  AST 28  ALT 21  ALKPHOS 80  BILITOT 0.3  PROT 6.5  ALBUMIN 3.3*   No results for input(s): LIPASE, AMYLASE in the last 168 hours. No results for input(s): AMMONIA in the last 168 hours. Coagulation profile No results for input(s): INR, PROTIME in the last 168 hours.  CBC: Recent Labs  Lab 04/30/18 1004  WBC 6.6  NEUTROABS  3.9  HGB 11.5*  HCT 38.7  MCV 89.6  PLT 249   Cardiac Enzymes: Recent Labs  Lab 04/30/18 1004 04/30/18 1504 04/30/18 2153  TROPONINI <0.03 <0.03 <0.03   BNP (last 3 results) No results for input(s): PROBNP in the last 8760 hours. CBG: No results for input(s): GLUCAP in the last 168 hours. D-Dimer: No results for input(s): DDIMER in the last 72 hours. Hgb A1c: Recent Labs    05/01/18 0338  HGBA1C 5.7*   Lipid Profile: Recent Labs    05/01/18 0338  CHOL 213*  HDL 46  LDLCALC 115*  TRIG 258*  CHOLHDL 4.6   Thyroid function studies: No results for input(s): TSH, T4TOTAL, T3FREE, THYROIDAB in the last 72 hours.  Invalid input(s): FREET3 Anemia work  up: No results for input(s): VITAMINB12, FOLATE, FERRITIN, TIBC, IRON, RETICCTPCT in the last 72 hours. Sepsis Labs: Recent Labs  Lab 04/30/18 1004  WBC 6.6    Microbiology No results found for this or any previous visit (from the past 240 hour(s)).  Procedures and diagnostic studies:  Dg Chest 2 View  Result Date: 04/30/2018 CLINICAL DATA:  Dizziness.  Chest tightness.  Shortness of breath. EXAM: CHEST - 2 VIEW COMPARISON:  Jul 15, 2016 FINDINGS: Mild opacity in left base is unchanged, likely scarring or chronic atelectasis. The heart, hila, mediastinum, lungs, and pleura are otherwise unremarkable. IMPRESSION: Chronic mild opacity in left base, likely scar atelectasis. No other acute abnormalities. Electronically Signed   By: Dorise Bullion III M.D   On: 04/30/2018 10:45   Ct Head Wo Contrast  Result Date: 04/30/2018 CLINICAL DATA:  Dizziness, headache EXAM: CT HEAD WITHOUT CONTRAST TECHNIQUE: Contiguous axial images were obtained from the base of the skull through the vertex without intravenous contrast. COMPARISON:  None. FINDINGS: Brain: No evidence of acute infarction, hemorrhage, hydrocephalus, extra-axial collection or mass lesion/mass effect. Subcortical white matter and periventricular small vessel ischemic changes. Vascular: Intracranial atherosclerosis. Skull: Normal. Negative for fracture or focal lesion. Sinuses/Orbits: The visualized paranasal sinuses are essentially clear. The mastoid air cells are unopacified. Other: None. IMPRESSION: No evidence of acute intracranial abnormality. Small vessel ischemic changes. Electronically Signed   By: Julian Hy M.D.   On: 04/30/2018 17:25    Medications:   . amLODipine  10 mg Oral Daily  . diphenhydrAMINE  12.5 mg Intravenous Once  . enoxaparin (LOVENOX) injection  40 mg Subcutaneous Q24H  . metoprolol tartrate  25 mg Oral BID  . prochlorperazine  10 mg Intravenous Once   Continuous Infusions:   LOS: 0 days   Geradine Girt  [facility]   How to contact the [facility] Attending or Consulting provider [place] or covering provider during after hours [place], for this patient?  1. Check the care team in [facility] and look for a) attending/consulting [facility] provider listed and b) the [facility] team listed 2. Log into [email or web address] and use [place]'s universal password to access. If you do not have the password, please contact the hospital operator. 3. Locate the [facility] provider you are looking for under [facility] and page to a number that you can be directly reached. 4. If you still have difficulty reaching the provider, please page the [facility] (Director on Call) for the Hospitalists listed on amion for assistance.  05/01/2018, 10:55 AM

## 2018-05-01 NOTE — Care Management Note (Addendum)
Case Management Note  Patient Details  Name: Katherine Henson MRN: [id number] Date of Birth: February 16, 1961  Subjective/Objective: Pt presented for Hypertensive urgency. PTA Independent from home with family support. Without PCP at this time.                   Action/Plan: CM did ask permission to set up PCP at the local clinic. CM was able to get an appointment at the [facility] and patient can utilize the [facility] for medications ranging in cost from $4.00-$10.00. Please send any new Rx's to the [facility] so medications can be delivered to bedside. No further needs from CM at this time.   Expected Discharge Date:                  Expected Discharge Plan:  Home/Self Care  In-House Referral:  NA  Discharge planning Services  CM Consult, Follow-up appt scheduled, [facility], Medication Assistance  Post Acute Care Choice:  NA Choice offered to:  NA  DME Arranged:  N/A DME Agency:  NA  HH Arranged:  NA HH Agency:  NA  Status of Service:  Completed, signed off  If discussed at [place] of Stay Meetings, dates discussed:    Additional Comments: 1139 05-03-18 Jacqlyn Krauss, RN,BSN [phone] Patient was given bus pass to travel home. No further needs from this CM at this time.  Bethena Roys, RN 05/01/2018, 3:00 PM

## 2018-05-02 ENCOUNTER — Other Ambulatory Visit: Payer: Self-pay

## 2018-05-02 MED ORDER — HYDRALAZINE HCL 20 MG/ML IJ SOLN
10.0000 mg | INTRAMUSCULAR | Status: DC | PRN
  Filled 2018-05-02: qty 1

## 2018-05-02 MED ORDER — INFLUENZA VAC SPLIT QUAD 0.5 ML IM SUSY
0.5000 mL | PREFILLED_SYRINGE | INTRAMUSCULAR | Status: AC
  Administered 2018-05-03: 0.5 mL via INTRAMUSCULAR
  Filled 2018-05-02: qty 0.5

## 2018-05-02 NOTE — Progress Notes (Signed)
Patient called nurse to room.  States she feels "funny, like everything is really slow". Denies CP/SOB.  Skin warm and dry. BP 139/88,  P71.  Patient states she thinks it is from medications taken this am.  Advised will let MD know.

## 2018-05-02 NOTE — Progress Notes (Signed)
Katherine Henson  ZOX:[id number] DOB: 02/27/1961  DOA: 04/30/2018 PCP: Patient, No Pcp Per    Brief Narrative:    58 y.o. female htn who presented to the ED today with c/o shortness of breath and dizziness.  Patient states that she was in her usual state of health until last night before bed when she had the relatively acute onset of shortness of breath, dizziness and intermittent chest tightness.  She states she takes metoprolol BID (prescribed in 2013) and she has not follow ups but she gets refills from "Dr. Tamala Julian".  No visits in Epic except PRN ER notes.  Assessment/Plan:   Principal Problem:   Hypertensive urgency Active Problems:   HTN (hypertension)   Chest pain   Scotoma   Dyspnea  Hypertensive urgency -concern for possible PRES given headache and visual disturbance. -admitting MD spoke with neurology by phone who recommended inpatient head CT to r/o bleed and outpatient workup to include MRI to look for findings of PRES.  -continue metoprolol 25 mg BID -added norvasc 10 mg -On top of this have added hydralazine as needed for blood pressure above 811 systolic -She does not feel comfortable going home today and she has had fluctuations in pressures probably because of anxiety -We will monitor again and if pressures remain controlled we can continue to have follow-up in the outpatient setting she will need a primary care physician to see her comprehensively and I will ask case manager to set up -continue to add medications  Chest tightness, dyspnea -likely related to her uncontrolled HTN  Headache -due to uncontrolled BP -head CT negative for bleed -migraine cocktail -neuro recommended referral to [facility] as outpatient -No alarm symptoms at this time   Family Communication/Anticipated D/C date and plan/Code Status   DVT prophylaxis: Lovenox ordered. Code Status: Full Code.  Family Communication: patient Disposition Plan:    Medical Consultants:     None.    Subjective:   Overall is doing okay eating and drinking she is very concerned and has multiple questions for me She has no current headache or fever   Ros  Chest pain, no chills, no Reiger  Objective:    Vitals:   05/02/18 0912 05/02/18 1400 05/02/18 1402 05/02/18 1445  BP: 119/84 (!) 156/107 (!) 165/139 (!) 141/102  Pulse: 90 65    Resp:  20    Temp:  97.7 F (36.5 C)    TempSrc:  Oral    SpO2:  99%    Weight:      Height:        Intake/Output Summary (Last 24 hours) at 05/02/2018 1459 Last data filed at 05/02/2018 0900 Gross per 24 hour  Intake 1164 ml  Output -  Net 1164 ml   Filed Weights   04/30/18 1817 05/01/18 0711 05/02/18 0410  Weight: 69.4 kg 69.1 kg 68.9 kg    Exam: Alert no distress patient does have strabismus which corrects on downward gaze Chest is clear no added sound S1-S2 no murmur rub or gallop No lower extremity edema Abdomen is soft nontender no rebound no guarding  Data Reviewed:   I have personally reviewed following labs and imaging studies:  Labs: Labs show the following:   Basic Metabolic Panel: Recent Labs  Lab 04/30/18 1004 05/01/18 0338  NA 140 139  K 3.8 3.9  CL 110 108  CO2 23 23  GLUCOSE 125* 108*  BUN 12 13  CREATININE 0.83 0.87  CALCIUM 9.4 9.2   GFR  Estimated Creatinine Clearance: 61.1 mL/min (by C-G formula based on SCr of 0.87 mg/dL). Liver Function Tests: Recent Labs  Lab 04/30/18 1004  AST 28  ALT 21  ALKPHOS 80  BILITOT 0.3  PROT 6.5  ALBUMIN 3.3*   No results for input(s): LIPASE, AMYLASE in the last 168 hours. No results for input(s): AMMONIA in the last 168 hours. Coagulation profile No results for input(s): INR, PROTIME in the last 168 hours.  CBC: Recent Labs  Lab 04/30/18 1004  WBC 6.6  NEUTROABS 3.9  HGB 11.5*  HCT 38.7  MCV 89.6  PLT 249   Cardiac Enzymes: Recent Labs  Lab 04/30/18 1004 04/30/18 1504 04/30/18 2153  TROPONINI <0.03 <0.03 <0.03   BNP  (last 3 results) No results for input(s): PROBNP in the last 8760 hours. CBG: No results for input(s): GLUCAP in the last 168 hours. D-Dimer: No results for input(s): DDIMER in the last 72 hours. Hgb A1c: Recent Labs    05/01/18 0338  HGBA1C 5.7*   Lipid Profile: Recent Labs    05/01/18 0338  CHOL 213*  HDL 46  LDLCALC 115*  TRIG 258*  CHOLHDL 4.6   Thyroid function studies: No results for input(s): TSH, T4TOTAL, T3FREE, THYROIDAB in the last 72 hours.  Invalid input(s): FREET3 Anemia work up: No results for input(s): VITAMINB12, FOLATE, FERRITIN, TIBC, IRON, RETICCTPCT in the last 72 hours. Sepsis Labs: Recent Labs  Lab 04/30/18 1004  WBC 6.6    Microbiology No results found for this or any previous visit (from the past 240 hour(s)).  Procedures and diagnostic studies:  Ct Head Wo Contrast  Result Date: 04/30/2018 CLINICAL DATA:  Dizziness, headache EXAM: CT HEAD WITHOUT CONTRAST TECHNIQUE: Contiguous axial images were obtained from the base of the skull through the vertex without intravenous contrast. COMPARISON:  None. FINDINGS: Brain: No evidence of acute infarction, hemorrhage, hydrocephalus, extra-axial collection or mass lesion/mass effect. Subcortical white matter and periventricular small vessel ischemic changes. Vascular: Intracranial atherosclerosis. Skull: Normal. Negative for fracture or focal lesion. Sinuses/Orbits: The visualized paranasal sinuses are essentially clear. The mastoid air cells are unopacified. Other: None. IMPRESSION: No evidence of acute intracranial abnormality. Small vessel ischemic changes. Electronically Signed   By: Julian Hy M.D.   On: 04/30/2018 17:25    Medications:   . amLODipine  10 mg Oral Daily  . diphenhydrAMINE  12.5 mg Intravenous Once  . enoxaparin (LOVENOX) injection  40 mg Subcutaneous Q24H  . [START ON 05/03/2018] Influenza vac split quadrivalent PF  0.5 mL Intramuscular Tomorrow-1000  . metoprolol tartrate   25 mg Oral BID  . prochlorperazine  10 mg Intravenous Once   Continuous Infusions:   LOS: 1 day   Jai-Gurmukh Pinchos Topel  [facility]   How to contact the [facility] Attending or Consulting provider [place] or covering provider during after hours [place], for this patient?  1. Check the care team in [facility] and look for a) attending/consulting [facility] provider listed and b) the [facility] team listed 2. Log into [email or web address] and use [place]'s universal password to access. If you do not have the password, please contact the hospital operator. 3. Locate the [facility] provider you are looking for under [facility] and page to a number that you can be directly reached. 4. If you still have difficulty reaching the provider, please page the [facility] (Director on Call) for the Hospitalists listed on amion for assistance.  05/02/2018, 2:59 PM

## 2018-05-03 DIAGNOSIS — I160 Hypertensive urgency: Secondary | ICD-10-CM

## 2018-05-03 LAB — RENAL FUNCTION PANEL
Albumin: 3.4 g/dL — ABNORMAL LOW (ref 3.5–5.0)
Anion gap: 9 (ref 5–15)
BUN: 17 mg/dL (ref 6–20)
CO2: 25 mmol/L (ref 22–32)
Calcium: 9.5 mg/dL (ref 8.9–10.3)
Chloride: 105 mmol/L (ref 98–111)
Creatinine, Ser: 0.92 mg/dL (ref 0.44–1.00)
GFR calc non Af Amer: >60 mL/min (ref >60)
Glucose, Bld: 102 mg/dL — ABNORMAL HIGH (ref 70–99)
PHOSPHORUS: 3.9 mg/dL (ref 2.5–4.6)
Potassium: 4.2 mmol/L (ref 3.5–5.1)
Sodium: 139 mmol/L (ref 135–145)

## 2018-05-03 MED ORDER — AMLODIPINE BESYLATE 10 MG PO TABS: 10.0000 mg | ORAL_TABLET | Freq: Every day | ORAL | 2 refills | Status: DC

## 2018-05-03 MED ORDER — METOPROLOL TARTRATE 25 MG PO TABS: 25.0000 mg | ORAL_TABLET | Freq: Two times a day (BID) | ORAL | 2 refills | Status: DC

## 2018-05-03 NOTE — Discharge Summary (Signed)
Physician Discharge Summary  Katherine Henson EQA:[id number] DOB: 09-Mar-1960 DOA: 04/30/2018  PCP: Patient, No Pcp Per  Admit date: 04/30/2018 Discharge date: 05/03/2018  Time spent: 25 minutes  Recommendations for Outpatient Follow-up:  1. Patient might need MRI of the brain if persisting headaches to rule out pres syndrome 2. Prescription called in for amlodipine and metoprolol twice daily 3. May need referral to Dr. Lavell Anchors as an outpatient if issues continue  Discharge Diagnoses:  Principal Problem:   Hypertensive urgency Active Problems:   HTN (hypertension)   Chest pain   Scotoma   Dyspnea   Discharge Condition: Improved  Diet recommendation: Heart healthy  Filed Weights   05/01/18 0711 05/02/18 0410 05/03/18 0522  Weight: 69.1 kg 68.9 kg 69 kg    History of present illness:  58 y.o. female  known HTN admitted 2/24 c/o shortness of breath and dizziness. Patient states that she was in her usual state of health until last night before bed when she had the relatively acute onset of shortness of breath, dizziness and intermittent chest tightness.  She states she takes metoprolol BID (prescribed in 2013) and she has not follow ups but she gets refills from "Dr. Tamala Julian".  No visits in Epic except PRN ER notes.  Hospital Course:  concern for possible PRES given headache and visual disturbance. -admitting MD spoke with neurology by phone who recommended inpatient head CT to r/o bleed and outpatient workup to include MRI to look for findings of PRES.  -continue metoprolol 25 mg BID -added norvasc 10 mg -On top of this have added hydralazine as needed for blood pressure above 979 systolic Blood pressures were reviewed-and reasonably controlled on discharge  Chest tightness, dyspnea -likely related to her uncontrolled HTN  Headache -due to uncontrolled BP -head CT negative for bleed -neuro recommended referral to [facility] as outpatient -No alarm symptoms at this  time  Procedures: CT head Consultations:  Telephone consulted neurology this admission  Discharge Exam: Vitals:   05/03/18 0033 05/03/18 0522  BP: 135/90 130/80  Pulse: 78 75  Resp: 18 16  Temp: 98.3 F (36.8 C) 99 F (37.2 C)  SpO2: 94% 95%    General: EOMI NCAT pleasant no distress Cardiovascular: S1-S2 no murmur rub or gallop Respiratory: Clinically clear no added sound no rales no rhonchi Abdomen soft nontender no rebound no guarding No lower extremity edema  Discharge Instructions   Discharge Instructions    Diet - low sodium heart healthy   Complete by:  As directed    Discharge instructions   Complete by:  As directed    You will need to continue medications and have these adjusted as an outpatient in order to ensure that you have good blood pressure control over the course of the next couple of months You may need additional medications It has been suggested that you get a scan of the brain to make sure that there is no findings of any changes because of your blood pressure and that can be coordinated as an outpatient I will call in your medications to your [facility] please pick them up and take them regularly   Increase activity slowly   Complete by:  As directed      Allergies as of 05/03/2018      Reactions   Penicillins Anaphylaxis   Did it involve swelling of the face/tongue/throat, SOB, or low BP? No Did it involve sudden or severe rash/hives, skin peeling, or any reaction on the inside of your  mouth or nose? Yes Did you need to seek medical attention at a hospital or doctor's office? No When did it last happen?30 years ago If all above answers are "NO", may proceed with cephalosporin use.      Medication List    TAKE these medications   amLODipine 10 MG tablet Commonly known as:  NORVASC Take 1 tablet (10 mg total) by mouth daily.   ibuprofen 200 MG tablet Commonly known as:  ADVIL,MOTRIN Take 200 mg by mouth every 6 (six) hours as needed  for mild pain.   metoprolol tartrate 25 MG tablet Commonly known as:  LOPRESSOR Take 25 mg by mouth 2 (two) times daily.      Allergies  Allergen Reactions  . Penicillins Anaphylaxis    Did it involve swelling of the face/tongue/throat, SOB, or low BP? No Did it involve sudden or severe rash/hives, skin peeling, or any reaction on the inside of your mouth or nose? Yes Did you need to seek medical attention at a hospital or doctor's office? No When did it last happen?30 years ago If all above answers are "NO", may proceed with cephalosporin use.   Follow-up Information    Scot Jun, FNP. Go on 05/28/2018.   Specialty:  Family Medicine Why:  @ 9:50 am for hospital follow up appointment. If you can not make this scheduled appointment, please call the office to reschedule.  Contact information: [place] [address] [phone]        [place]. Go to.   Why:  this location for assistance with medications-Cost will range from $4.00-$10.00. Contact information: [address] [place] [phone]           The results of significant diagnostics from this hospitalization (including imaging, microbiology, ancillary and laboratory) are listed below for reference.    Significant Diagnostic Studies: Dg Chest 2 View  Result Date: 04/30/2018 CLINICAL DATA:  Dizziness.  Chest tightness.  Shortness of breath. EXAM: CHEST - 2 VIEW COMPARISON:  Jul 15, 2016 FINDINGS: Mild opacity in left base is unchanged, likely scarring or chronic atelectasis. The heart, hila, mediastinum, lungs, and pleura are otherwise unremarkable. IMPRESSION: Chronic mild opacity in left base, likely scar atelectasis. No other acute abnormalities. Electronically Signed   By: Dorise Bullion III M.D   On: 04/30/2018 10:45   Ct Head Wo Contrast  Result Date: 04/30/2018 CLINICAL DATA:  Dizziness, headache  EXAM: CT HEAD WITHOUT CONTRAST TECHNIQUE: Contiguous axial images were obtained from the base of the skull through the vertex without intravenous contrast. COMPARISON:  None. FINDINGS: Brain: No evidence of acute infarction, hemorrhage, hydrocephalus, extra-axial collection or mass lesion/mass effect. Subcortical white matter and periventricular small vessel ischemic changes. Vascular: Intracranial atherosclerosis. Skull: Normal. Negative for fracture or focal lesion. Sinuses/Orbits: The visualized paranasal sinuses are essentially clear. The mastoid air cells are unopacified. Other: None. IMPRESSION: No evidence of acute intracranial abnormality. Small vessel ischemic changes. Electronically Signed   By: Julian Hy M.D.   On: 04/30/2018 17:25    Microbiology: No results found for this or any previous visit (from the past 240 hour(s)).   Labs: Basic Metabolic Panel: Recent Labs  Lab 04/30/18 1004 05/01/18 0338 05/03/18 0313  NA 140 139 139  K 3.8 3.9 4.2  CL 110 108 105  CO2 23 23 25   GLUCOSE 125* 108* 102*  BUN 12 13 17   CREATININE 0.83 0.87 0.92  CALCIUM 9.4 9.2  9.5  PHOS  --   --  3.9   Liver Function Tests: Recent Labs  Lab 04/30/18 1004 05/03/18 0313  AST 28  --   ALT 21  --   ALKPHOS 80  --   BILITOT 0.3  --   PROT 6.5  --   ALBUMIN 3.3* 3.4*   No results for input(s): LIPASE, AMYLASE in the last 168 hours. No results for input(s): AMMONIA in the last 168 hours. CBC: Recent Labs  Lab 04/30/18 1004  WBC 6.6  NEUTROABS 3.9  HGB 11.5*  HCT 38.7  MCV 89.6  PLT 249   Cardiac Enzymes: Recent Labs  Lab 04/30/18 1004 04/30/18 1504 04/30/18 2153  TROPONINI <0.03 <0.03 <0.03   BNP: BNP (last 3 results) No results for input(s): BNP in the last 8760 hours.  ProBNP (last 3 results) No results for input(s): PROBNP in the last 8760 hours.  CBG: No results for input(s): GLUCAP in the last 168 hours.     Signed:  Nita Sells MD   [facility] 05/03/2018, 10:42 AM

## 2018-05-03 NOTE — Progress Notes (Signed)
Patient refusing replacement of IV, patient states that she feels she is going home in AM. Endorses that she will allow IV placement if IV blood pressure medications are indicated or any other IV medications become necessary. Will continue to monitor and educate patient.

## 2018-05-28 ENCOUNTER — Ambulatory Visit (INDEPENDENT_AMBULATORY_CARE_PROVIDER_SITE_OTHER): Payer: Self-pay | Admitting: Family Medicine

## 2018-05-28 ENCOUNTER — Other Ambulatory Visit: Payer: Self-pay

## 2018-05-28 ENCOUNTER — Telehealth: Payer: Self-pay | Admitting: Family Medicine

## 2018-05-28 ENCOUNTER — Encounter: Payer: Self-pay | Admitting: Family Medicine

## 2018-05-28 VITALS — BP 103/65 | HR 58 | Temp 97.3°F | Resp 17 | Ht 61.0 in | Wt 156.8 lb

## 2018-05-28 DIAGNOSIS — R519 Headache, unspecified: Secondary | ICD-10-CM

## 2018-05-28 DIAGNOSIS — R51 Headache: Secondary | ICD-10-CM

## 2018-05-28 DIAGNOSIS — Z7689 Persons encountering health services in other specified circumstances: Secondary | ICD-10-CM

## 2018-05-28 DIAGNOSIS — I10 Essential (primary) hypertension: Secondary | ICD-10-CM

## 2018-05-28 DIAGNOSIS — E785 Hyperlipidemia, unspecified: Secondary | ICD-10-CM

## 2018-05-28 DIAGNOSIS — N3001 Acute cystitis with hematuria: Secondary | ICD-10-CM

## 2018-05-28 DIAGNOSIS — R42 Dizziness and giddiness: Secondary | ICD-10-CM

## 2018-05-28 LAB — POCT URINALYSIS DIP (CLINITEK)
GLUCOSE UA: NEGATIVE mg/dL
Ketones, POC UA: NEGATIVE mg/dL
Nitrite, UA: NEGATIVE
Spec Grav, UA: >=1.03 — AB (ref 1.010–1.025)
Urobilinogen, UA: 0.2 E.U./dL
pH, UA: 6 (ref 5.0–8.0)

## 2018-05-28 MED ORDER — METOPROLOL TARTRATE 25 MG PO TABS: 12.5000 mg | ORAL_TABLET | Freq: Two times a day (BID) | ORAL | 2 refills | Status: DC

## 2018-05-28 MED ORDER — SIMVASTATIN 20 MG PO TABS: 20.0000 mg | ORAL_TABLET | Freq: Every day | ORAL | 4 refills | Status: DC

## 2018-05-28 MED ORDER — CIPROFLOXACIN HCL 500 MG PO TABS: 500.0000 mg | ORAL_TABLET | Freq: Two times a day (BID) | ORAL | 0 refills | Status: DC

## 2018-05-28 NOTE — Progress Notes (Signed)
Katherine Henson, is a 58 y.o. female  UXL:[id number]  ZDG:[id number]  DOB - Jul 16, 1960  CC:  Chief Complaint  Patient presents with  . Establish Care  . Hospitalization Follow-up    ED-> [facility] 2/24-2/27: dizziness, hypertensive urgency, UTI, chest pain       HPI: Fernando is a 58 y.o. female is here today to establish care.   Darleen O Henson has HTN (hypertension); Chest pain; Scotoma; Dyspnea; and Hypertensive urgency on their problem list.   Hospital Follow-up:  Patient was admitted to [facility] on 04/30/2018 with accelerated hypertension, dizziness, headache, and shortness of breath. Upon presenting to the hospital patient had not had any primary care and had relied predominantly on the ER for medical needs.  She expressed to the hospitalist that she was previously on blood pressure medicine however there are no records available in the EMR.  Patient was also found to have a UTI while inpatient. Unable to see which medication was used to treat UTI while inpatient.  Patient was discharged on 05/03/18 with recommendations to follow-up with neurology outpatient for further evaluation of headaches and dizziness.  She was also discharged on amlodipine and metoprolol for hypertension management.  UTI Patient continues to complain today of right flank pain and frequency of urination. She denies any visible blood in urine. Denies nausea, vomiting, or abdominal pain. She endorses occasional chills. Denies fever. Patient was not discharged on any oral antibiotics.   Headaches Patient continues to have right-sided headaches.headaches are predominantly at the frontal region and radiate medially to the back of her head.  Only the right side of her head is affected by headaches. She characterizes the pain as dull to throbbing. She endorses blurring of vision out of the right eye only. These headaches began to occur approximately 2 months ago.  She denies any previous history of chronic migraines  or chronic headaches. Currently she is averaging at least 2 headaches per week since resuming her blood pressure medications. For headaches she takes either ibuprofen or Tylenol which are sometimes effective and sometimes ineffective.  Denies any recent or previously known head injuries. She denies any falls. Endorses dizziness however dizziness occurs with and without headaches.  Hypertension  Patient reports compliance with amlodipine and metoprolol.  Blood pressure is soft today at 103/65. She took medications 1 to 2 hours prior to her arrival today.  She is not routinely monitoring her blood pressure as she does not have a cuff at home.  Denies heavy sodium intake.  No routine physical activity.  She is a non-smoker. Current Body mass index is 29.63 kg/m.   Current medications: Current Outpatient Medications:  .  amLODipine (NORVASC) 10 MG tablet, Take 1 tablet (10 mg total) by mouth daily., Disp: 30 tablet, Rfl: 2 .  metoprolol tartrate (LOPRESSOR) 25 MG tablet, Take 1 tablet (25 mg total) by mouth 2 (two) times daily., Disp: 60 tablet, Rfl: 2   Pertinent family medical history: family history is not on file.     Social History   Socioeconomic History  . Marital status: Single    Spouse name: Not on file  . Number of children: Not on file  . Years of education: Not on file  . Highest education level: Not on file  Occupational History  . Not on file  Social Needs  . Financial resource strain: Not on file  . Food insecurity:    Worry: Not on file    Inability: Not on file  . Transportation  needs:    Medical: Not on file    Non-medical: Not on file  Tobacco Use  . Smoking status: Former Smoker    Last attempt to quit: 12/06/2010    Years since quitting: 7.4  . Smokeless tobacco: Never Used  Substance and Sexual Activity  . Alcohol use: Yes  . Drug use: No  . Sexual activity: Not on file  Lifestyle  . Physical activity:    Days per week: Not on file    Minutes per  session: Not on file  . Stress: Not on file  Relationships  . Social connections:    Talks on phone: Not on file    Gets together: Not on file    Attends religious service: Not on file    Active member of club or organization: Not on file    Attends meetings of clubs or organizations: Not on file    Relationship status: Not on file  . Intimate partner violence:    Fear of current or ex partner: Not on file    Emotionally abused: Not on file    Physically abused: Not on file    Forced sexual activity: Not on file  Other Topics Concern  . Not on file  Social History Narrative  . Not on file    Review of Systems: Pertinent negatives listed in HPI  Objective:   Vitals:   05/28/18 0847  BP: 103/65  Pulse: (!) 58  Resp: 17  Temp: (!) 97.3 F (36.3 C)  SpO2: 95%    BP Readings from Last 3 Encounters:  05/03/18 130/80  03/15/18 (!) 160/21  07/16/16 (!) 141/94    Filed Weights   05/28/18 0847  Weight: 156 lb 12.8 oz (71.1 kg)     Physical Exam: Constitutional: Patient appears well-developed and well-nourished. No distress. HENT: Normocephalic, atraumatic, External right and left ear normal.   Eyes: Conjunctivae and EOM are normal. PERRLA, no scleral icterus. Neck: Normal ROM. Neck supple. No JVD. No tracheal deviation. No thyromegaly. CVS: RRR, S1/S2 +, no murmurs, no gallops, no carotid bruit.  Pulmonary: Effort and breath sounds normal, no stridor, rhonchi, wheezes, rales.  Abdominal: Soft. BS +, no distension, mild CVA (R)tenderness, rebound or guarding.  Musculoskeletal: Normal range of motion. No edema and no tenderness.  Neuro: Alert. Normal muscle tone coordination. Normal gait.  Skin: Skin is warm and dry. No rash noted. Not diaphoretic. No erythema. No pallor. Psychiatric: Normal mood and affect. Behavior, judgment, thought content normal.  Lab Results (prior encounters)  Lab Results  Component Value Date   WBC 6.6 04/30/2018   HGB 11.5 (L) 04/30/2018    HCT 38.7 04/30/2018   MCV 89.6 04/30/2018   PLT 249 04/30/2018   Lab Results  Component Value Date   CREATININE 0.92 05/03/2018   BUN 17 05/03/2018   NA 139 05/03/2018   K 4.2 05/03/2018   CL 105 05/03/2018   CO2 25 05/03/2018    Lab Results  Component Value Date   HGBA1C 5.7 (H) 05/01/2018       Component Value Date/Time   CHOL 213 (H) 05/01/2018 0338   TRIG 258 (H) 05/01/2018 0338   HDL 46 05/01/2018 0338   CHOLHDL 4.6 05/01/2018 0338   VLDL 52 (H) 05/01/2018 0338   LDLCALC 115 (H) 05/01/2018 0338        Assessment and plan:  1. Encounter to establish care   2. Essential hypertension, low readings today -Continue amlodipine  -Reduce metoprolol 12.5 mg  twice daily , pulse <58 today  3. Acute cystitis with hematuria We will treat empirically with CiproFloxin 500 mg twice daily x 10 days - POCT URINALYSIS DIP (CLINITEK) - Urine Culture  4. Hyperlipidemia, unspecified hyperlipidemia type -Start simvastatin  5. Nonintractable headache, unspecified chronicity pattern, unspecified headache type -recent CT of head negative of any acute findings. Mild vascular disease noted.  - Ambulatory referral to Neurology  6. Dizziness -Reduce metoprolol 12.5 mg twice daily. BP soft today, low readings will likely exacerbate  dizziness. - Ambulatory referral to Neurology   Return in about 3 months (around 08/28/2018) for BP follow-up and Labs.   The patient was given clear instructions to go to ER or return to medical center if symptoms don't improve, worsen or new problems develop. The patient verbalized understanding. The patient was advised  to call and obtain lab results if they haven't heard anything from out office within 7-10 business days.  Molli Barrows, FNP Primary Care at [facility] [address], [place] [address] [phone]: [phone]    This note has been created with Dragon speech recognition software and IT trainer. Any transcriptional errors are unintentional.

## 2018-05-28 NOTE — Telephone Encounter (Signed)
I'll send a new prescription for metoprolol

## 2018-05-28 NOTE — Patient Instructions (Addendum)
Thank you for choosing Primary Care at [facility] to be your medical home!    Katherine Henson was seen by Molli Barrows, FNP today.   Vonda Antigua Bansal's primary care provider is Scot Jun, FNP.   For the best care possible, you should try to see Molli Barrows, FNP-C whenever you come to the clinic.   We look forward to seeing you again soon!  If you have any questions about your visit today, please call us at [phone] or feel free to reach your primary care provider via [place].     I have prescribed an antibiotic Ciprofloxacin 500 mg twice daily for 10 days.  I have added Simvastatin 20 mg to treat elevated cholesterol.

## 2018-05-28 NOTE — Telephone Encounter (Signed)
Spoke with patient & went over the medication changes. She states that the Metoprolol tablets are really small & she isn't sure she'll be able to split them in half evenly. She wants to know if you can send a Rx for 12.5 mg Metoprolol to her pharmacy.

## 2018-05-28 NOTE — Telephone Encounter (Signed)
Left message with family member for patient to return call. Need to reduce metoprolol 12.5 mg twice daily due to changes in pulse rate

## 2018-05-29 LAB — URINE CULTURE

## 2018-08-01 ENCOUNTER — Telehealth: Payer: Self-pay | Admitting: Family Medicine

## 2018-08-01 MED ORDER — AMLODIPINE BESYLATE 10 MG PO TABS: 10.0000 mg | ORAL_TABLET | Freq: Every day | ORAL | 2 refills | Status: DC

## 2018-08-01 NOTE — Telephone Encounter (Signed)
Caller Name: Akeila O. [place]   Reason for Call:  Medication refill for amLODipine (NORVASC) 10 MG tablet [[id number]]    If this is a medication request: confirm pharmacy  [facility] [address]  Verify call back number: [phone]   Action taken by recipient of request:

## 2018-08-01 NOTE — Telephone Encounter (Signed)
Prescription sent

## 2018-08-31 ENCOUNTER — Telehealth: Payer: Self-pay

## 2018-08-31 NOTE — Telephone Encounter (Signed)
Called patient to do their pre-visit COVID screening.  Have you been tested for COVID or are you currently waiting for COVID test results? no  Have you recently traveled internationally([place], [place], [place], [place], [place]) or within the [place] to a hotspot area([place], [place], [place], [place], [place])? no  Are you currently experiencing any of the following: fever, cough, SHOB, fatigue, body aches, loss of smell, rash, diarrhea, vomiting, severe headaches, weakness, sore throat? no  Have you been in contact with anyone who has recently travelled? no  Have you been in contact with anyone who is experiencing any of the above symptoms or been diagnosed with COVID  or works in or has recently visited a SNF? no  Asked patient to come fasting for repeat lipid panel

## 2018-09-03 ENCOUNTER — Other Ambulatory Visit: Payer: Self-pay

## 2018-09-03 ENCOUNTER — Encounter: Payer: Self-pay | Admitting: Family Medicine

## 2018-09-03 ENCOUNTER — Ambulatory Visit (INDEPENDENT_AMBULATORY_CARE_PROVIDER_SITE_OTHER): Payer: Self-pay | Admitting: Family Medicine

## 2018-09-03 VITALS — BP 126/82 | HR 62 | Temp 97.6°F | Resp 17 | Ht 61.0 in | Wt 154.6 lb

## 2018-09-03 DIAGNOSIS — R7303 Prediabetes: Secondary | ICD-10-CM

## 2018-09-03 DIAGNOSIS — Z1231 Encounter for screening mammogram for malignant neoplasm of breast: Secondary | ICD-10-CM

## 2018-09-03 DIAGNOSIS — Z1211 Encounter for screening for malignant neoplasm of colon: Secondary | ICD-10-CM

## 2018-09-03 DIAGNOSIS — Z6829 Body mass index (BMI) 29.0-29.9, adult: Secondary | ICD-10-CM

## 2018-09-03 DIAGNOSIS — E782 Mixed hyperlipidemia: Secondary | ICD-10-CM

## 2018-09-03 DIAGNOSIS — I10 Essential (primary) hypertension: Secondary | ICD-10-CM

## 2018-09-03 MED ORDER — SIMVASTATIN 20 MG PO TABS: 20.0000 mg | ORAL_TABLET | Freq: Every day | ORAL | 4 refills | Status: DC

## 2018-09-03 MED ORDER — METOPROLOL TARTRATE 25 MG PO TABS: 12.5000 mg | ORAL_TABLET | Freq: Two times a day (BID) | ORAL | 2 refills | Status: DC

## 2018-09-03 MED ORDER — AMLODIPINE BESYLATE 10 MG PO TABS: 10.0000 mg | ORAL_TABLET | Freq: Every day | ORAL | 2 refills | Status: DC

## 2018-09-03 NOTE — Patient Instructions (Signed)
DASH Eating Plan DASH stands for "Dietary Approaches to Stop Hypertension." The DASH eating plan is a healthy eating plan that has been shown to reduce high blood pressure (hypertension). It may also reduce your risk for type 2 diabetes, heart disease, and stroke. The DASH eating plan may also help with weight loss. What are tips for following this plan?  General guidelines  Avoid eating more than 2,300 mg (milligrams) of salt (sodium) a day. If you have hypertension, you may need to reduce your sodium intake to 1,500 mg a day.  Limit alcohol intake to no more than 1 drink a day for nonpregnant women and 2 drinks a day for men. One drink equals 12 oz of beer, 5 oz of wine, or 1 oz of hard liquor.  Work with your health care provider to maintain a healthy body weight or to lose weight. Ask what an ideal weight is for you.  Get at least 30 minutes of exercise that causes your heart to beat faster (aerobic exercise) most days of the week. Activities may include walking, swimming, or biking.  Work with your health care provider or diet and nutrition specialist (dietitian) to adjust your eating plan to your individual calorie needs. Reading food labels   Check food labels for the amount of sodium per serving. Choose foods with less than 5 percent of the Daily Value of sodium. Generally, foods with less than 300 mg of sodium per serving fit into this eating plan.  To find whole grains, look for the word "whole" as the first word in the ingredient list. Shopping  Buy products labeled as "low-sodium" or "no salt added."  Buy fresh foods. Avoid canned foods and premade or frozen meals. Cooking  Avoid adding salt when cooking. Use salt-free seasonings or herbs instead of table salt or sea salt. Check with your health care provider or pharmacist before using salt substitutes.  Do not fry foods. Cook foods using healthy methods such as baking, boiling, grilling, and broiling  instead.  Cook with heart-healthy oils, such as olive, canola, soybean, or sunflower oil. Meal planning  Eat a balanced diet that includes: ? 5 or more servings of fruits and vegetables each day. At each meal, try to fill half of your plate with fruits and vegetables. ? Up to 6-8 servings of whole grains each day. ? Less than 6 oz of lean meat, poultry, or fish each day. A 3-oz serving of meat is about the same size as a deck of cards. One egg equals 1 oz. ? 2 servings of low-fat dairy each day. ? A serving of nuts, seeds, or beans 5 times each week. ? Heart-healthy fats. Healthy fats called Omega-3 fatty acids are found in foods such as flaxseeds and coldwater fish, like sardines, salmon, and mackerel.  Limit how much you eat of the following: ? Canned or prepackaged foods. ? Food that is high in trans fat, such as fried foods. ? Food that is high in saturated fat, such as fatty meat. ? Sweets, desserts, sugary drinks, and other foods with added sugar. ? Full-fat dairy products.  Do not salt foods before eating.  Try to eat at least 2 vegetarian meals each week.  Eat more home-cooked food and less restaurant, buffet, and fast food.  When eating at a restaurant, ask that your food be prepared with less salt or no salt, if possible. What foods are recommended? The items listed may not be a complete list. Talk with  your dietitian about what dietary choices are best for you. Grains Whole-grain or whole-wheat bread. Whole-grain or whole-wheat pasta. Brown rice. [place]. Bulgur. Whole-grain and low-sodium cereals. Pita bread. Low-fat, low-sodium crackers. Whole-wheat flour tortillas. Vegetables Fresh or frozen vegetables (raw, steamed, roasted, or grilled). Low-sodium or reduced-sodium tomato and vegetable juice. Low-sodium or reduced-sodium tomato sauce and tomato paste. Low-sodium or reduced-sodium canned vegetables. Fruits All fresh, dried, or frozen fruit. Canned fruit in  natural juice (without added sugar). Meat and other protein foods Skinless chicken or [place]. Ground chicken or [place]. Pork with fat trimmed off. Fish and seafood. Egg whites. Dried beans, peas, or lentils. Unsalted nuts, nut butters, and seeds. Unsalted canned beans. Lean cuts of beef with fat trimmed off. Low-sodium, lean deli meat. Dairy Low-fat (1%) or fat-free (skim) milk. Fat-free, low-fat, or reduced-fat cheeses. Nonfat, low-sodium ricotta or cottage cheese. Low-fat or nonfat yogurt. Low-fat, low-sodium cheese. Fats and oils Soft margarine without trans fats. Vegetable oil. Low-fat, reduced-fat, or light mayonnaise and salad dressings (reduced-sodium). Canola, safflower, olive, soybean, and sunflower oils. Avocado. Seasoning and other foods Herbs. Spices. Seasoning mixes without salt. Unsalted popcorn and pretzels. Fat-free sweets. What foods are not recommended? The items listed may not be a complete list. Talk with your dietitian about what dietary choices are best for you. Grains Baked goods made with fat, such as croissants, muffins, or some breads. Dry pasta or rice meal packs. Vegetables Creamed or fried vegetables. Vegetables in a cheese sauce. Regular canned vegetables (not low-sodium or reduced-sodium). Regular canned tomato sauce and paste (not low-sodium or reduced-sodium). Regular tomato and vegetable juice (not low-sodium or reduced-sodium). Angie Fava. Olives. Fruits Canned fruit in a light or heavy syrup. Fried fruit. Fruit in cream or butter sauce. Meat and other protein foods Fatty cuts of meat. Ribs. Fried meat. Berniece Salines. Sausage. Bologna and other processed lunch meats. Salami. Fatback. Hotdogs. Bratwurst. Salted nuts and seeds. Canned beans with added salt. Canned or smoked fish. Whole eggs or egg yolks. Chicken or [place] with skin. Dairy Whole or 2% milk, cream, and half-and-half. Whole or full-fat cream cheese. Whole-fat or sweetened yogurt. Full-fat cheese. Nondairy  creamers. Whipped toppings. Processed cheese and cheese spreads. Fats and oils Butter. Stick margarine. Lard. Shortening. Ghee. Bacon fat. Tropical oils, such as coconut, palm kernel, or palm oil. Seasoning and other foods Salted popcorn and pretzels. Onion salt, garlic salt, seasoned salt, table salt, and sea salt. Worcestershire sauce. Tartar sauce. Barbecue sauce. Teriyaki sauce. Soy sauce, including reduced-sodium. Steak sauce. Canned and packaged gravies. Fish sauce. Oyster sauce. Cocktail sauce. Horseradish that you find on the shelf. Ketchup. Mustard. Meat flavorings and tenderizers. Bouillon cubes. Hot sauce and Tabasco sauce. Premade or packaged marinades. Premade or packaged taco seasonings. Relishes. Regular salad dressings. Where to find more information:  National Heart, Lung, and [place]: https://wilson-eaton.com/  American Heart Association: www.heart.org Summary  The DASH eating plan is a healthy eating plan that has been shown to reduce high blood pressure (hypertension). It may also reduce your risk for type 2 diabetes, heart disease, and stroke.  With the DASH eating plan, you should limit salt (sodium) intake to 2,300 mg a day. If you have hypertension, you may need to reduce your sodium intake to 1,500 mg a day.  When on the DASH eating plan, aim to eat more fresh fruits and vegetables, whole grains, lean proteins, low-fat dairy, and heart-healthy fats.  Work with your health care provider or diet and nutrition specialist (dietitian) to adjust your eating  plan to your individual calorie needs. This information is not intended to replace advice given to you by your health care provider. Make sure you discuss any questions you have with your health care provider. Document Released: 02/10/2011 Document Revised: 02/03/2017 Document Reviewed: 02/15/2016 Elsevier Patient Education  [place].  Dizziness Dizziness is a common problem. It makes you feel unsteady or  light-headed. You may feel like you are about to pass out (faint). Dizziness can lead to getting hurt if you stumble or fall. Dizziness can be caused by many things, including:  Medicines.  Not having enough water in your body (dehydration).  Illness. Follow these instructions at home: Eating and drinking   Drink enough fluid to keep your pee (urine) clear or pale yellow. This helps to keep you from getting dehydrated. Try to drink more clear fluids, such as water.  Do not drink alcohol.  Limit how much caffeine you drink or eat, if your doctor tells you to do that.  Limit how much salt (sodium) you drink or eat, if your doctor tells you to do that. Activity   Avoid making quick movements. ? When you stand up from sitting in a chair, steady yourself until you feel okay. ? In the morning, first sit up on the side of the bed. When you feel okay, stand slowly while you hold onto something. Do this until you know that your balance is fine.  If you need to stand in one place for a long time, move your legs often. Tighten and relax the muscles in your legs while you are standing.  Do not drive or use heavy machinery if you feel dizzy.  Avoid bending down if you feel dizzy. Place items in your home so you can reach them easily without leaning over. Lifestyle  Do not use any products that contain nicotine or tobacco, such as cigarettes and e-cigarettes. If you need help quitting, ask your doctor.  Try to lower your stress level. You can do this by using methods such as yoga or meditation. Talk with your doctor if you need help. General instructions  Watch your dizziness for any changes.  Take over-the-counter and prescription medicines only as told by your doctor. Talk with your doctor if you think that you are dizzy because of a medicine that you are taking.  Tell a friend or a family member that you are feeling dizzy. If he or she notices any changes in your behavior, have this  person call your doctor.  Keep all follow-up visits as told by your doctor. This is important. Contact a doctor if:  Your dizziness does not go away.  Your dizziness or light-headedness gets worse.  You feel sick to your stomach (nauseous).  You have trouble hearing.  You have new symptoms.  You are unsteady on your feet.  You feel like the room is spinning. Get help right away if:  You throw up (vomit) or have watery poop (diarrhea), and you cannot eat or drink anything.  You have trouble: ? Talking. ? Walking. ? Swallowing. ? Using your arms, hands, or legs.  You feel generally weak.  You are not thinking clearly, or you have trouble forming sentences. A friend or family member may notice this.  You have: ? Chest pain. ? Pain in your belly (abdomen). ? Shortness of breath. ? Sweating.  Your vision changes.  You are bleeding.  You have a very bad headache.  You have neck pain or a stiff neck.  You have a fever. These symptoms may be an emergency. Do not wait to see if the symptoms will go away. Get medical help right away. Call your local emergency services (911 in the [place] Do not drive yourself to the hospital. Summary  Dizziness makes you feel unsteady or light-headed. You may feel like you are about to pass out (faint).  Drink enough fluid to keep your pee (urine) clear or pale yellow. Do not drink alcohol.  Avoid making quick movements if you feel dizzy.  Watch your dizziness for any changes. This information is not intended to replace advice given to you by your health care provider. Make sure you discuss any questions you have with your health care provider. Document Released: 02/10/2011 Document Revised: 02/24/2017 Document Reviewed: 03/10/2016 Elsevier Patient Education  2020 [employer].

## 2018-09-03 NOTE — Progress Notes (Signed)
cmp Patient ID: Katherine Henson, female    DOB: 08-08-1960, 58 y.o.   MRN: [id number]  PCP: Scot Jun, FNP  Chief Complaint  Patient presents with  . Hypertension  . Hyperlipidemia    Subjective:  HPI  Katherine Henson is a 58 y.o. female presents for evaluation hypertension and hyperlipidemia.  Hypertension Katherine Henson reports no home monitoring of blood pressure.  She takes medication daily as prescribed.  She is currently prescribed amlodipine and metoprolol. During her last visit her cholesterol was evaluated and was found to be abnormally high. Patient is currently prescribed simvastatin 20 mg daily. She has had some recent weight gain and is currently an active of routine physical activity. Only other complaint today she endorses some dizziness with standing up too quickly.  She reports when she sits down and rests symptoms resolved.  She has no associated headache or weakness when these episodes occur. Dizziness only occurs with changes in position and at no other time.  She denies any recent falls. She is a nonsmoker. Denies any episodes of  shortness of breath or chest pain.   Health maintenance Patient is overdue for mammogram.  She endorses a significant family history of breast cancer.  Her sister and both of her sisters daughters were recently diagnosed with breast cancer.  She has another sister who passed away of complications related to breast cancer.  She denies knowledge of her mother having had breast cancer.  She reports no known history of abnormal breast mammogram.  Patient is also overdue for colonoscopy however she is uninsured and would like to do the fit test.  No known family history of colon cancer.  Patient is also overdue for Pap smear.  No known history of abnormal Pap smear and it has been several years since she has had a cervical cancer screening. Social History   Socioeconomic History  . Marital status: Single    Spouse name: Not on file   . Number of children: Not on file  . Years of education: Not on file  . Highest education level: Not on file  Occupational History  . Not on file  Social Needs  . Financial resource strain: Not on file  . Food insecurity    Worry: Not on file    Inability: Not on file  . Transportation needs    Medical: Not on file    Non-medical: Not on file  Tobacco Use  . Smoking status: Former Smoker    Quit date: 12/06/2010    Years since quitting: 7.7  . Smokeless tobacco: Never Used  Substance and Sexual Activity  . Alcohol use: Yes  . Drug use: No  . Sexual activity: Not on file  Lifestyle  . Physical activity    Days per week: Not on file    Minutes per session: Not on file  . Stress: Not on file  Relationships  . Social Herbalist on phone: Not on file    Gets together: Not on file    Attends religious service: Not on file    Active member of club or organization: Not on file    Attends meetings of clubs or organizations: Not on file    Relationship status: Not on file  . Intimate partner violence    Fear of current or ex partner: Not on file    Emotionally abused: Not on file    Physically abused: Not on file    Forced sexual  activity: Not on file  Other Topics Concern  . Not on file  Social History Narrative  . Not on file    Family History  Family history unknown: Yes    Review of Systems Pertinent negatives listed in HPI Patient Active Problem List   Diagnosis Date Noted  . HTN (hypertension) 04/30/2018  . Chest pain 04/30/2018  . [place] 04/30/2018  . Dyspnea 04/30/2018  . Hypertensive urgency 04/30/2018   Prior to Admission medications   Medication Sig Start Date End Date Taking? Authorizing Provider  amLODipine (NORVASC) 10 MG tablet Take 1 tablet (10 mg total) by mouth daily. 08/01/18  Yes Scot Jun, FNP  metoprolol tartrate (LOPRESSOR) 25 MG tablet Take 0.5 tablets (12.5 mg total) by mouth 2 (two) times daily. 05/28/18  Yes Scot Jun, FNP  simvastatin (ZOCOR) 20 MG tablet Take 1 tablet (20 mg total) by mouth daily at 6 PM. 05/28/18  Yes Scot Jun, FNP    Past Medical, Surgical Family and Social History reviewed and updated.    Objective:   Today's Vitals   09/03/18 1005  BP: 126/82  Pulse: 62  Resp: 17  Temp: 97.6 F (36.4 C)  TempSrc: Temporal  SpO2: 96%  Weight: 154 lb 9.6 oz (70.1 kg)  Height: 5\' 1"  (1.549 m)    Wt Readings from Last 3 Encounters:  09/03/18 154 lb 9.6 oz (70.1 kg)  05/28/18 156 lb 12.8 oz (71.1 kg)  05/03/18 152 lb 1.9 oz (69 kg)     Physical Exam General appearance: alert, well developed, well nourished, cooperative and in no distress Head: Normocephalic, without obvious abnormality, atraumatic Respiratory: Respirations even and unlabored, normal respiratory rate Heart: rate and rhythm normal. No gallop or murmurs noted on exam  Extremities: No gross deformities Skin: Skin color, texture, turgor normal. No rashes seen  Psych: Appropriate mood and affect. Neurologic: Mental status: Alert, oriented to person, place, and time, thought content appropriate.  No results found for: POCGLU  Lab Results  Component Value Date   HGBA1C 5.7 (H) 05/01/2018       Assessment & Plan:   1. Mixed hyperlipidemia -Currently on simvastatin 20 mg once daily.  If lipid panel remains abnormal will increase simvastatin to 40 mg once daily. - Lipid Panel  2. Essential hypertension, controlled We have discussed target BP range and blood pressure goal. I have advised patient to check BP regularly and to call us back or report to clinic if the numbers are consistently higher than 140/90. We discussed the importance of compliance with medical therapy and DASH diet recommended, consequences of uncontrolled hypertension discussed.  - continue current BP medications - POCT URINALYSIS DIP (CLINITEK) - Comprehensive metabolic panel  3. Visit for screening mammogram - MM 3D SCREEN  BREAST BILATERAL; Future  4. Prediabetes, last A1c 5.7.  Will recheck at next visit.  Patient is recently gained weight and encouraged weight control through diet and exercise. - Comprehensive metabolic panel  5. Screen for Colon Cancer Patient is uninsured therefore unable to obtain a colonoscopy. Patient was given specimen collection containers in order to receive a fit test.  Return for follow-up in 3 months patient will need a Pap along with evaluation of hypertension and a repeat A1c   -The patient was given clear instructions to go to ER or return to medical center if symptoms do not improve, worsen or new problems develop. The patient verbalized understanding.    Molli Barrows, FNP [facility]  [place], [place] [phone]: [phone]

## 2018-09-04 LAB — COMPREHENSIVE METABOLIC PANEL
ALT: 13 IU/L (ref 0–32)
AST: 22 IU/L (ref 0–40)
Albumin/Globulin Ratio: 1.2 (ref 1.2–2.2)
Albumin: 3.9 g/dL (ref 3.8–4.9)
Alkaline Phosphatase: 97 IU/L (ref 39–117)
BUN/Creatinine Ratio: 13 (ref 9–23)
BUN: 10 mg/dL (ref 6–24)
Bilirubin Total: 0.3 mg/dL (ref 0.0–1.2)
CO2: 19 mmol/L — ABNORMAL LOW (ref 20–29)
Calcium: 9.4 mg/dL (ref 8.7–10.2)
Chloride: 105 mmol/L (ref 96–106)
Creatinine, Ser: 0.77 mg/dL (ref 0.57–1.00)
GFR calc Af Amer: 98 mL/min/{1.73_m2} (ref >59)
GFR calc non Af Amer: 85 mL/min/{1.73_m2} (ref >59)
Globulin, Total: 3.3 g/dL (ref 1.5–4.5)
Glucose: 96 mg/dL (ref 65–99)
Potassium: 4 mmol/L (ref 3.5–5.2)
Sodium: 140 mmol/L (ref 134–144)
Total Protein: 7.2 g/dL (ref 6.0–8.5)

## 2018-09-04 LAB — LIPID PANEL
Chol/HDL Ratio: 2.7 ratio (ref 0.0–4.4)
Cholesterol, Total: 159 mg/dL (ref 100–199)
HDL: 59 mg/dL (ref >39)
LDL Calculated: 84 mg/dL (ref 0–99)
Triglycerides: 81 mg/dL (ref 0–149)
VLDL Cholesterol Cal: 16 mg/dL (ref 5–40)

## 2018-09-04 NOTE — Progress Notes (Signed)
Patient notified of results & recommendations. Expressed understanding.

## 2018-11-06 ENCOUNTER — Other Ambulatory Visit: Payer: Self-pay

## 2018-11-06 ENCOUNTER — Ambulatory Visit
Admission: RE | Admit: 2018-11-06 | Discharge: 2018-11-06 | Disposition: A | Discharge status: Home / Self Care | Payer: No Typology Code available for payment source | Source: Ambulatory Visit | Attending: Family Medicine | Admitting: Family Medicine

## 2018-11-06 DIAGNOSIS — Z1231 Encounter for screening mammogram for malignant neoplasm of breast: Secondary | ICD-10-CM

## 2018-11-15 NOTE — Progress Notes (Signed)
Mammogram result letter mailed 11/07/2018 by [place].

## 2018-11-22 ENCOUNTER — Emergency Department (HOSPITAL_COMMUNITY)
Admission: EM | Admit: 2018-11-22 | Discharge: 2018-11-22 | Disposition: A | Discharge status: Home / Self Care | Payer: No Typology Code available for payment source | Attending: Emergency Medicine | Admitting: Emergency Medicine

## 2018-11-22 ENCOUNTER — Encounter (HOSPITAL_COMMUNITY): Payer: Self-pay | Admitting: Emergency Medicine

## 2018-11-22 ENCOUNTER — Other Ambulatory Visit: Payer: Self-pay

## 2018-11-22 ENCOUNTER — Emergency Department (HOSPITAL_COMMUNITY): Payer: No Typology Code available for payment source

## 2018-11-22 DIAGNOSIS — Z20822 Contact with and (suspected) exposure to covid-19: Secondary | ICD-10-CM

## 2018-11-22 DIAGNOSIS — Z79899 Other long term (current) drug therapy: Secondary | ICD-10-CM | POA: Insufficient documentation

## 2018-11-22 DIAGNOSIS — R0602 Shortness of breath: Secondary | ICD-10-CM | POA: Insufficient documentation

## 2018-11-22 DIAGNOSIS — Z20828 Contact with and (suspected) exposure to other viral communicable diseases: Secondary | ICD-10-CM | POA: Insufficient documentation

## 2018-11-22 DIAGNOSIS — I10 Essential (primary) hypertension: Secondary | ICD-10-CM | POA: Insufficient documentation

## 2018-11-22 DIAGNOSIS — Z87891 Personal history of nicotine dependence: Secondary | ICD-10-CM | POA: Insufficient documentation

## 2018-11-22 LAB — SARS CORONAVIRUS 2 (TAT 6-24 HRS): SARS Coronavirus 2: NEGATIVE

## 2018-11-22 NOTE — ED Provider Notes (Signed)
[facility] Provider Note   CSN: [id number] Arrival date & time: 11/22/18  1045     History   Chief Complaint Chief Complaint  Patient presents with  . Shortness of Breath    HPI Katherine Henson is a 58 y.o. female.     The history is provided by the patient. No language interpreter was used.  Shortness of Breath    58 year old female with history of hypertension, former smoker presenting complaining cold symptoms.  Patient states that for the past 2 to 3 days she noticed some mild shortness of breath.  States that it just felt like she does not Normal Breath like She Normally Does.  This Morning, She Also Reported That She Cannot Taste the Food.  This Is New.  She reported mild scratchy sensation in her throat.  She does not complain of any fever or chills, body aches, runny nose sneezing coughing chest pain nausea vomiting diarrhea.  She denies any recent sick contact.  She denies any recent travel.  No other complaint.  Past Medical History:  Diagnosis Date  . HTN (hypertension)     Patient Active Problem List   Diagnosis Date Noted  . HTN (hypertension) 04/30/2018  . Chest pain 04/30/2018  . [place] 04/30/2018  . Dyspnea 04/30/2018  . Hypertensive urgency 04/30/2018    History reviewed. No pertinent surgical history.   OB History   No obstetric history on file.      Home Medications    Prior to Admission medications   Medication Sig Start Date End Date Taking? Authorizing Provider  amLODipine (NORVASC) 10 MG tablet Take 1 tablet (10 mg total) by mouth daily. 09/03/18  Yes Scot Jun, FNP  metoprolol tartrate (LOPRESSOR) 25 MG tablet Take 0.5 tablets (12.5 mg total) by mouth 2 (two) times daily. 09/03/18  Yes Scot Jun, FNP  simvastatin (ZOCOR) 20 MG tablet Take 1 tablet (20 mg total) by mouth daily at 6 PM. 09/03/18  Yes Scot Jun, FNP    Family History Family History  Family history unknown:  Yes    Social History Social History   Tobacco Use  . Smoking status: Former Smoker    Quit date: 12/06/2010    Years since quitting: 7.9  . Smokeless tobacco: Never Used  Substance Use Topics  . Alcohol use: Yes  . Drug use: No     Allergies   Penicillins   Review of Systems Review of Systems  Respiratory: Positive for shortness of breath.   All other systems reviewed and are negative.    Physical Exam Updated Vital Signs BP 140/85 (BP Location: Right Arm)   Pulse 78   Temp 98.1 F (36.7 C) (Oral)   Resp 16   Ht 5' (1.524 m)   Wt 63.5 kg   LMP 09/27/2012   SpO2 100%   BMI 27.34 kg/m   Physical Exam Vitals signs and nursing note reviewed.  Constitutional:      General: She is not in acute distress.    Appearance: She is well-developed.  HENT:     Head: Atraumatic.  Eyes:     Conjunctiva/sclera: Conjunctivae normal.  Neck:     Musculoskeletal: Neck supple.  Cardiovascular:     Rate and Rhythm: Normal rate and regular rhythm.  Pulmonary:     Effort: Pulmonary effort is normal.     Breath sounds: Normal breath sounds. No decreased breath sounds, wheezing, rhonchi or rales.  Chest:  Chest wall: No tenderness.  Abdominal:     Palpations: Abdomen is soft.     Tenderness: There is no abdominal tenderness.  Skin:    Capillary Refill: Capillary refill takes less than 2 seconds.     Findings: No rash.  Neurological:     Mental Status: She is alert and oriented to person, place, and time.  Psychiatric:        Mood and Affect: Mood normal.      ED Treatments / Results  Labs (all labs ordered are listed, but only abnormal results are displayed) Labs Reviewed  SARS CORONAVIRUS 2 (TAT 6-24 HRS)    EKG None  Radiology Dg Chest 2 View  Result Date: 11/22/2018 CLINICAL DATA:  Shortness of breath and loss of taste. EXAM: CHEST - 2 VIEW COMPARISON:  April 30, 2018 FINDINGS: Cardiomediastinal silhouette is normal. Mediastinal contours appear  intact. There is no evidence of focal airspace consolidation, pleural effusion or pneumothorax. Stable left middle lobe scarring. Osseous structures are without acute abnormality. Soft tissues are grossly normal. IMPRESSION: No active cardiopulmonary disease. Electronically Signed   By: Fidela Salisbury M.D.   On: 11/22/2018 11:29    Procedures Procedures (including critical care time)  Medications Ordered in ED Medications - No data to display   Initial Impression / Assessment and Plan / ED Course  I have reviewed the triage vital signs and the nursing notes.  Pertinent labs & imaging results that were available during my care of the patient were reviewed by me and considered in my medical decision making (see chart for details).        BP 140/85 (BP Location: Right Arm)   Pulse 78   Temp 98.1 F (36.7 C) (Oral)   Resp 16   Ht 5' (1.524 m)   Wt 63.5 kg   LMP 09/27/2012   SpO2 100%   BMI 27.34 kg/m    Final Clinical Impressions(s) / ED Diagnoses   Final diagnoses:  Suspected Covid-19 Virus Infection    ED Discharge Orders    None     12:07 PM Patient here complaining of mild shortness of breath, throat irritation, as well as loss of taste.  In the setting of COVID-19 pandemic, I will obtain COVID-19 test.  Patient otherwise satting at 100%, in no respiratory discomfort, lungs are clear, chest x-ray unremarkable.  Patient will be notified if test positive.  COVID-19 instruction provided as per CDC recommendation.  Katherine Henson was evaluated in Emergency Department on 11/22/2018 for the symptoms described in the history of present illness. She was evaluated in the context of the global COVID-19 pandemic, which necessitated consideration that the patient might be at risk for infection with the SARS-CoV-2 virus that causes COVID-19. Institutional protocols and algorithms that pertain to the evaluation of patients at risk for COVID-19 are in a state of rapid change based  on information released by regulatory bodies including the CDC and federal and state organizations. These policies and algorithms were followed during the patient's care in the ED.    Domenic Moras, PA-C 11/22/18 1210    Isla Pence, MD 11/22/18 1224

## 2018-11-22 NOTE — ED Triage Notes (Addendum)
Pt states she started feeling SOB this morning. And she noticed this morning she could not taste her food. Sore throat that started today. Denies CP. Denies fever/bodyaches. Pt not in resp distress. Not noticeably SOB.

## 2018-11-22 NOTE — ED Notes (Signed)
Patient Alert and oriented to baseline. Stable and ambulatory to baseline. Patient verbalized understanding of the discharge instructions.  Patient belongings were taken by the patient.   

## 2018-11-22 NOTE — ED Notes (Signed)
ED Provider at bedside. 

## 2018-11-26 ENCOUNTER — Telehealth (HOSPITAL_COMMUNITY): Payer: Self-pay

## 2018-12-05 ENCOUNTER — Telehealth: Payer: Self-pay

## 2018-12-05 NOTE — Telephone Encounter (Signed)

## 2018-12-06 ENCOUNTER — Ambulatory Visit (INDEPENDENT_AMBULATORY_CARE_PROVIDER_SITE_OTHER): Payer: Self-pay | Admitting: Family Medicine

## 2018-12-06 ENCOUNTER — Other Ambulatory Visit: Payer: Self-pay

## 2018-12-06 VITALS — BP 123/82 | HR 73 | Temp 97.2°F | Resp 17 | Ht 63.0 in | Wt 154.4 lb

## 2018-12-06 DIAGNOSIS — Z1159 Encounter for screening for other viral diseases: Secondary | ICD-10-CM

## 2018-12-06 DIAGNOSIS — I10 Essential (primary) hypertension: Secondary | ICD-10-CM

## 2018-12-06 DIAGNOSIS — B182 Chronic viral hepatitis C: Secondary | ICD-10-CM

## 2018-12-06 DIAGNOSIS — R7303 Prediabetes: Secondary | ICD-10-CM

## 2018-12-06 DIAGNOSIS — Z1211 Encounter for screening for malignant neoplasm of colon: Secondary | ICD-10-CM

## 2018-12-06 DIAGNOSIS — E782 Mixed hyperlipidemia: Secondary | ICD-10-CM

## 2018-12-06 DIAGNOSIS — R6889 Other general symptoms and signs: Secondary | ICD-10-CM

## 2018-12-06 HISTORY — DX: Chronic viral hepatitis C: B18.2

## 2018-12-06 MED ORDER — METOPROLOL TARTRATE 25 MG PO TABS: 12.5000 mg | ORAL_TABLET | Freq: Two times a day (BID) | ORAL | 6 refills | Status: DC

## 2018-12-06 MED ORDER — SIMVASTATIN 20 MG PO TABS: 20.0000 mg | ORAL_TABLET | Freq: Every day | ORAL | 6 refills | Status: DC

## 2018-12-06 MED ORDER — CETIRIZINE HCL 10 MG PO TABS: 10.0000 mg | ORAL_TABLET | Freq: Every day | ORAL | 1 refills | Status: DC

## 2018-12-06 MED ORDER — AMLODIPINE BESYLATE 10 MG PO TABS: 10.0000 mg | ORAL_TABLET | Freq: Every day | ORAL | 6 refills | Status: DC

## 2018-12-06 NOTE — Patient Instructions (Signed)

## 2018-12-06 NOTE — Progress Notes (Signed)
Subjective:  Patient ID: Katherine Henson, female    DOB: 10-03-1960  Age: 58 y.o. MRN: [id number]  CC: Prediabetes, Hypertension, and Hyperlipidemia   HPI Katherine Henson is a 58 year old female who presents today for a follow up visit. Medical history is significant for Hypertension, Hyperlipidemia, Prediabetes (A1c 5.7). She endorses compliance with her antihypertensive and statin with no adverse effects from her medications and prediabetes is diet controlled. She complains of a one week history of "something stuck in her throat" and sometimes has a sweet taste in her mouth. Denies presence of sore throat, post nasal drip, sinus congestion dyspnea, chest pain, wheezing, fever. On 11/22/18 she tested negative for SARS-Cov-2 at the ED after she presented with dyspnea and abnormal taste. She is a former smoker and denies weight loss but has gained weight.  Past Medical History:  Diagnosis Date  . HTN (hypertension)     History reviewed. No pertinent surgical history.  Family History  Family history unknown: Yes    Allergies  Allergen Reactions  . Penicillins Anaphylaxis    Did it involve swelling of the face/tongue/throat, SOB, or low BP? No Did it involve sudden or severe rash/hives, skin peeling, or any reaction on the inside of your mouth or nose? Yes Did you need to seek medical attention at a hospital or doctor's office? No When did it last happen?30 years ago If all above answers are "NO", may proceed with cephalosporin use.    Outpatient Medications Prior to Visit  Medication Sig Dispense Refill  . amLODipine (NORVASC) 10 MG tablet Take 1 tablet (10 mg total) by mouth daily. 30 tablet 2  . metoprolol tartrate (LOPRESSOR) 25 MG tablet Take 0.5 tablets (12.5 mg total) by mouth 2 (two) times daily. 60 tablet 2  . simvastatin (ZOCOR) 20 MG tablet Take 1 tablet (20 mg total) by mouth daily at 6 PM. 30 tablet 4   No facility-administered medications prior to visit.       ROS Review of Systems  Constitutional: Negative for activity change, appetite change and fatigue.  HENT: Negative for congestion, sinus pressure and sore throat.   Eyes: Negative for visual disturbance.  Respiratory: Negative for cough, chest tightness, shortness of breath and wheezing.   Cardiovascular: Negative for chest pain and palpitations.  Gastrointestinal: Negative for abdominal distention, abdominal pain and constipation.  Endocrine: Negative for polydipsia.  Genitourinary: Negative for dysuria and frequency.  Musculoskeletal: Negative for arthralgias and back pain.  Skin: Negative for rash.  Neurological: Negative for tremors, light-headedness and numbness.  Hematological: Does not bruise/bleed easily.  Psychiatric/Behavioral: Negative for agitation and behavioral problems.    Objective:  BP 123/82   Pulse 73   Temp (!) 97.2 F (36.2 C) (Temporal)   Resp 17   Ht _0  (1.6 m)   Wt 154 lb 6.4 oz (70 kg)   LMP 09/27/2012   SpO2 97%   BMI 27.35 kg/m   BP/Weight 12/06/2018 11/22/2018 8/81/1031  Systolic BP 594 585 929  Diastolic BP 82 85 82  Wt. (Lbs) 154.4 140 154.6  BMI 27.35 27.34 29.21      Physical Exam Constitutional:      Appearance: She is well-developed.  HENT:     Mouth/Throat:     Mouth: Mucous membranes are dry.     Pharynx: No oropharyngeal exudate or posterior oropharyngeal erythema.  Neck:     Vascular: No JVD.  Cardiovascular:     Rate and Rhythm: Normal rate.  Heart sounds: Normal heart sounds. No murmur.  Pulmonary:     Effort: Pulmonary effort is normal.     Breath sounds: Normal breath sounds. No wheezing or rales.  Chest:     Chest wall: No tenderness.  Abdominal:     General: Bowel sounds are normal. There is no distension.     Palpations: Abdomen is soft. There is no mass.     Tenderness: There is no abdominal tenderness.  Musculoskeletal: Normal range of motion.     Right lower leg: No edema.     Left lower leg: No  edema.  Neurological:     Mental Status: She is alert and oriented to person, place, and time.  Psychiatric:        Mood and Affect: Mood normal.     CMP Latest Ref Rng & Units 09/03/2018 05/03/2018 05/01/2018  Glucose 65 - 99 mg/dL 96 102(H) 108(H)  BUN 6 - 24 mg/dL _0 Creatinine 0.57 - 1.00 mg/dL 0.77 0.92 0.87  Sodium 134 - 144 mmol/L 140 139 139  Potassium 3.5 - 5.2 mmol/L 4.0 4.2 3.9  Chloride 96 - 106 mmol/L 105 105 108  CO2 20 - 29 mmol/L 19(L) 25 23  Calcium 8.7 - 10.2 mg/dL 9.4 9.5 9.2  Total Protein 6.0 - 8.5 g/dL 7.2 - -  Total Bilirubin 0.0 - 1.2 mg/dL 0.3 - -  Alkaline Phos 39 - 117 IU/L 97 - -  AST 0 - 40 IU/L 22 - -  ALT 0 - 32 IU/L 13 - -    Lipid Panel     Component Value Date/Time   CHOL 159 09/03/2018 1101   TRIG 81 09/03/2018 1101   HDL 59 09/03/2018 1101   CHOLHDL 2.7 09/03/2018 1101   CHOLHDL 4.6 05/01/2018 0338   VLDL 52 (H) 05/01/2018 0338   LDLCALC 84 09/03/2018 1101    CBC    Component Value Date/Time   WBC 6.6 04/30/2018 1004   RBC 4.32 04/30/2018 1004   HGB 11.5 (L) 04/30/2018 1004   HCT 38.7 04/30/2018 1004   PLT 249 04/30/2018 1004   MCV 89.6 04/30/2018 1004   MCH 26.6 04/30/2018 1004   MCHC 29.7 (L) 04/30/2018 1004   RDW 14.4 04/30/2018 1004   LYMPHSABS 1.9 04/30/2018 1004   MONOABS 0.5 04/30/2018 1004   EOSABS 0.2 04/30/2018 1004   BASOSABS 0.0 04/30/2018 1004    Lab Results  Component Value Date   HGBA1C 5.7 (H) 05/01/2018    Assessment & Plan:   1. Essential hypertension Controlled Counseled on blood pressure goal of less than 130/80, low-sodium, DASH diet, medication compliance, 150 minutes of moderate intensity exercise per week. Discussed medication compliance, adverse effects. - CMP14+EGFR - amLODipine (NORVASC) 10 MG tablet; Take 1 tablet (10 mg total) by mouth daily.  Dispense: 30 tablet; Refill: 6 - metoprolol tartrate (LOPRESSOR) 25 MG tablet; Take 0.5 tablets (12.5 mg total) by mouth 2 (two) times daily.   Dispense: 60 tablet; Refill: 6  2. Mixed hyperlipidemia Controlled Counseled on low cholesterol diet - Lipid panel - simvastatin (ZOCOR) 20 MG tablet; Take 1 tablet (20 mg total) by mouth daily at 6 PM.  Dispense: 30 tablet; Refill: 6  3. Screening for colon cancer Previously did FIT test but results not in chart Lack of insurance precludes colonoscopy - Fecal occult blood, imunochemical([facility])  4. Need for hepatitis C screening test - Hepatitis c antibody (reflex)  5. Prediabetes Controlled with A1c of 5.7 -  Hemoglobin A1c  6. Throat symptom - cetirizine (ZYRTEC) 10 MG tablet; Take 1 tablet (10 mg total) by mouth daily.  Dispense: 30 tablet; Refill: 1   Health Care Maintenance: next month - PAP smear; up to date of mamogram Meds ordered this encounter  Medications  . amLODipine (NORVASC) 10 MG tablet    Sig: Take 1 tablet (10 mg total) by mouth daily.    Dispense:  30 tablet    Refill:  6  . metoprolol tartrate (LOPRESSOR) 25 MG tablet    Sig: Take 0.5 tablets (12.5 mg total) by mouth 2 (two) times daily.    Dispense:  60 tablet    Refill:  6  . simvastatin (ZOCOR) 20 MG tablet    Sig: Take 1 tablet (20 mg total) by mouth daily at 6 PM.    Dispense:  30 tablet    Refill:  6  . cetirizine (ZYRTEC) 10 MG tablet    Sig: Take 1 tablet (10 mg total) by mouth daily.    Dispense:  30 tablet    Refill:  1    Follow-up: Return in about 1 month (around 01/06/2019) for PAP smear.       Charlott Rakes, MD, FAAFP. [facility] [place], [place]   12/06/2018, 9:00 AM

## 2018-12-06 NOTE — Progress Notes (Signed)
Would like flu shot today.  Patient is fasting.

## 2018-12-07 ENCOUNTER — Other Ambulatory Visit: Payer: Self-pay | Admitting: Family Medicine

## 2018-12-07 DIAGNOSIS — B182 Chronic viral hepatitis C: Secondary | ICD-10-CM

## 2018-12-07 LAB — CMP14+EGFR
ALT: 22 IU/L (ref 0–32)
AST: 26 IU/L (ref 0–40)
Albumin/Globulin Ratio: 1.1 — ABNORMAL LOW (ref 1.2–2.2)
Albumin: 4 g/dL (ref 3.8–4.9)
Alkaline Phosphatase: 111 IU/L (ref 39–117)
BUN/Creatinine Ratio: 14 (ref 9–23)
BUN: 12 mg/dL (ref 6–24)
Bilirubin Total: 0.3 mg/dL (ref 0.0–1.2)
CO2: 24 mmol/L (ref 20–29)
Calcium: 9.9 mg/dL (ref 8.7–10.2)
Chloride: 104 mmol/L (ref 96–106)
Creatinine, Ser: 0.84 mg/dL (ref 0.57–1.00)
GFR calc Af Amer: 89 mL/min/{1.73_m2} (ref >59)
GFR calc non Af Amer: 77 mL/min/{1.73_m2} (ref >59)
Globulin, Total: 3.5 g/dL (ref 1.5–4.5)
Glucose: 102 mg/dL — ABNORMAL HIGH (ref 65–99)
Potassium: 4.6 mmol/L (ref 3.5–5.2)
Sodium: 144 mmol/L (ref 134–144)
Total Protein: 7.5 g/dL (ref 6.0–8.5)

## 2018-12-07 LAB — LIPID PANEL
Chol/HDL Ratio: 2.7 ratio (ref 0.0–4.4)
Cholesterol, Total: 171 mg/dL (ref 100–199)
HDL: 63 mg/dL (ref >39)
LDL Chol Calc (NIH): 94 mg/dL (ref 0–99)
Triglycerides: 75 mg/dL (ref 0–149)
VLDL Cholesterol Cal: 14 mg/dL (ref 5–40)

## 2018-12-07 LAB — HEMOGLOBIN A1C
Est. average glucose Bld gHb Est-mCnc: 128 mg/dL
Hgb A1c MFr Bld: 6.1 % — ABNORMAL HIGH (ref 4.8–5.6)

## 2018-12-07 LAB — HEPATITIS C ANTIBODY (REFLEX): HCV Ab: >11 s/co ratio — ABNORMAL HIGH (ref 0.0–0.9)

## 2018-12-07 LAB — COMMENT2 - HEP PANEL

## 2018-12-10 ENCOUNTER — Other Ambulatory Visit: Payer: Self-pay

## 2018-12-10 ENCOUNTER — Other Ambulatory Visit: Payer: No Typology Code available for payment source

## 2018-12-10 DIAGNOSIS — B182 Chronic viral hepatitis C: Secondary | ICD-10-CM

## 2018-12-10 NOTE — Progress Notes (Signed)
Patient here for additional labs.

## 2018-12-13 LAB — HEPATITIS C GENOTYPE

## 2018-12-13 LAB — HEPATITIS C VRS RNA DETECT BY PCR-QUAL: HCV RNA NAA Qualitative: POSITIVE — AB

## 2018-12-14 ENCOUNTER — Other Ambulatory Visit: Payer: Self-pay | Admitting: Family Medicine

## 2018-12-14 DIAGNOSIS — B182 Chronic viral hepatitis C: Secondary | ICD-10-CM

## 2018-12-14 NOTE — Progress Notes (Signed)
Patient notified of results & recommendations. Expressed understanding.

## 2018-12-20 ENCOUNTER — Ambulatory Visit (INDEPENDENT_AMBULATORY_CARE_PROVIDER_SITE_OTHER): Payer: Self-pay | Admitting: Infectious Diseases

## 2018-12-20 ENCOUNTER — Encounter: Payer: Self-pay | Admitting: Infectious Diseases

## 2018-12-20 ENCOUNTER — Telehealth: Payer: Self-pay | Admitting: Pharmacy Technician

## 2018-12-20 ENCOUNTER — Other Ambulatory Visit: Payer: Self-pay

## 2018-12-20 VITALS — BP 132/88 | HR 76 | Temp 98.0°F | Wt 157.0 lb

## 2018-12-20 DIAGNOSIS — B182 Chronic viral hepatitis C: Secondary | ICD-10-CM

## 2018-12-20 NOTE — Patient Instructions (Signed)
Return to clinic in 3 weeks to discuss treatment options for HCV.

## 2018-12-20 NOTE — Progress Notes (Signed)
Katherine Henson  [id number]  November 09, 1960    HPI: The patient is a 58 y.o. y/o AA female who presents today for an evaluation for HCV.  She recently had a positive hepatitis C antibody on December 15, 2018 as part of a routine age cohort screening process.  Confirmatory labs showed a positive qualitative hepatitis C viral load and infection with genotype 1a.  She denies classic risk factors such as past IV drug use, receipt of a blood transfusion, tattoos, body piercings, or known prior hepatitis C exposures.  She does state that her fianc of the past 25 years recently passed away last month and that they rarely use condoms throughout the relationship.  He was a Scientist, research (life sciences), serving in the late 1970s.  Interestingly, she does recall him stating that he had received vaccinations via a vaccine gun while he was serving in the [employer].  He denies any jaundicing illness in the past.  Her only drug use was remote marijuana use while she was in high school.  She drinks wine every couple of weeks per her report.  She is otherwise without complaints and anxious to begin treatment for hepatitis C.  Past Medical History:  Diagnosis Date  . Allergy   . Chronic hepatitis C ([place]) 12/06/2018  . HTN (hypertension)   . Hyperlipidemia     History reviewed. No pertinent surgical history.   Family History  Problem Relation Age of Onset  . Stroke Mother   . Diabetes Sister   . Cancer Sister   . Diabetes Brother      Social History   Tobacco Use  . Smoking status: Former Smoker    Packs/day: 0.50    Years: 30.00    Pack years: 15.00    Quit date: 12/06/2010    Years since quitting: 8.0  . Smokeless tobacco: Never Used  Substance Use Topics  . Alcohol use: Yes    Alcohol/week: 1.0 standard drinks    Types: 1 Glasses of wine per week  . Drug use: Not Currently    Types: Marijuana      reports previously being sexually active.   Allergies  Allergen Reactions  . Penicillins Anaphylaxis     Did it involve swelling of the face/tongue/throat, SOB, or low BP? No Did it involve sudden or severe rash/hives, skin peeling, or any reaction on the inside of your mouth or nose? Yes Did you need to seek medical attention at a hospital or doctor's office? No When did it last happen?30 years ago If all above answers are "NO", may proceed with cephalosporin use.     Outpatient Medications Prior to Visit  Medication Sig Dispense Refill  . amLODipine (NORVASC) 10 MG tablet Take 1 tablet (10 mg total) by mouth daily. 30 tablet 6  . cetirizine (ZYRTEC) 10 MG tablet Take 1 tablet (10 mg total) by mouth daily. 30 tablet 1  . metoprolol tartrate (LOPRESSOR) 25 MG tablet Take 0.5 tablets (12.5 mg total) by mouth 2 (two) times daily. 60 tablet 6  . simvastatin (ZOCOR) 20 MG tablet Take 1 tablet (20 mg total) by mouth daily at 6 PM. 30 tablet 6   No facility-administered medications prior to visit.      Review of Systems  Constitutional: Negative for chills, fatigue and fever.  HENT: Negative for congestion, hearing loss and sinus pressure.   Eyes: Negative for photophobia, discharge, redness and visual disturbance.  Respiratory: Negative for apnea, cough, shortness of breath and wheezing.  Cardiovascular: Negative for chest pain and leg swelling.  Gastrointestinal: Negative for abdominal distention, abdominal pain, constipation, diarrhea, nausea and vomiting.  Endocrine: Negative for cold intolerance, heat intolerance, polydipsia and polyuria.  Genitourinary: Negative for dysuria, flank pain, frequency, urgency, vaginal bleeding and vaginal discharge.  Musculoskeletal: Negative for arthralgias, back pain, joint swelling and neck pain.  Skin: Negative for pallor and rash.  Allergic/Immunologic: Negative for immunocompromised state.  Neurological: Negative for dizziness, seizures, speech difficulty, weakness and headaches.  Hematological: Does not bruise/bleed easily.   Psychiatric/Behavioral: Negative for agitation, confusion, hallucinations and sleep disturbance. The patient is not nervous/anxious.      Vitals:   12/20/18 0901  BP: 132/88  Pulse: 76  Temp: 98 F (36.7 C)     Physical Exam Gen: pleasant, NAD, A&Ox 3 Head: NCAT, no temporal wasting evident EENT: PERRL, EOMI, +strabismus, MMM, adequate dentition Neck: supple, no JVD CV: NRRR, no murmurs evident Pulm: CTA bilaterally, no wheeze or retractions Abd: soft, NTND, +BS Extrems: no LE edema, 2+ pulses Skin: no rashes, adequate skin turgor Neuro: CN II-XII grossly intact, no focal neurologic deficits appreciated, gait was normal, A&Ox 3   Labs: No results found for: HEPBSAG  No results found for: HCVRNAPCRQN  No results found for: FIBROSTAGE  HCV genotype 1a on 12/10/2018  HCV Ab - positive on 12/06/2018  Qualitative HCV viral load - positive on 12/10/2018  Lab Results  Component Value Date   WBC 6.6 04/30/2018   HGB 11.5 (L) 04/30/2018   HCT 38.7 04/30/2018   MCV 89.6 04/30/2018   PLT 249 04/30/2018       Chemistry      Component Value Date/Time   NA 144 12/06/2018 0859   K 4.6 12/06/2018 0859   CL 104 12/06/2018 0859   CO2 24 12/06/2018 0859   BUN 12 12/06/2018 0859   CREATININE 0.84 12/06/2018 0859      Component Value Date/Time   CALCIUM 9.9 12/06/2018 0859   ALKPHOS 111 12/06/2018 0859   AST 26 12/06/2018 0859   ALT 22 12/06/2018 0859   BILITOT 0.3 12/06/2018 0859        Assessment/Plan: The patient is a 58 year old African-American female with hypertension and hyperlipidemia who presents today for an evaluation for chronic hepatitis C.  HCV - Her HCV Ab was positive on 12/06/2018, thus confirming past exposure to pathogen. Note: this screening test will remain positive/reactive life-long even if HCV infection has been cured/immunologically cleared. Her most likely risk factor for acquisition was past sexual exposure as her now deceased fianc served in the  [employer] and did receive vaccines via a vaccine gun during his service.  She states they rarely use condoms throughout their 25-year relationship.  Chronic infection has been confirmed with a positive qualitative viral load and HCV genotype of 1 a.  Will check a qunatitative HCV viral load today to further assess. Check HIV Ab to exclude co-infection as well. As chronic infection has been confirmed, will proceed with FibroSURE testing to determine stage of fibrosis and best directly active antiviral treatment options. F/u in 3 weeks to review lab results.  She briefly met with our pharmacy staff today to complete compassionate use forms for DAA options, so that she may begin treatment once further data has been collected.  Health maintenance - I have counselled the patient extensively re: the need for barrier precautions with sexual activity in order to prevent sexual and/or vertical transmission of HCV. She must continue these practices until 3 months  after treatment completion until a sustained virologic response (SVR) has been confirmed to establish a cure of her infection. She expressed full understanding of these instructions. Vaccination for hepatitis A & B will be recommended if her hepatitis B surface antigen and hepatitis B surface antibody are both negative on today's screening.

## 2018-12-20 NOTE — Telephone Encounter (Signed)
[facility] Patient Advocate Encounter ° °Insurance verification completed.   ° °The patient is uninsured and will need patient assistance for medication. ° °We can complete the application and will need to meet with the patient for signatures and income documentation. ° °Chaka Jefferys E. Adaysha Dubinsky, CPhT °Specialty Pharmacy Patient Advocate °[facility] °Phone: [phone] °Fax:  [phone] ° ° °

## 2018-12-27 LAB — LIVER FIBROSIS, FIBROTEST-ACTITEST
ALT: 21 U/L (ref 6–29)
Alpha-2-Macroglobulin: 310 mg/dL — ABNORMAL HIGH (ref 106–279)
Apolipoprotein A1: 192 mg/dL (ref 101–198)
Bilirubin: 0.3 mg/dL (ref 0.2–1.2)
Fibrosis Score: 0.18
GGT: 32 U/L (ref 3–70)
Haptoglobin: 173 mg/dL (ref 43–212)
Necroinflammat ACT Score: 0.07
Reference ID: 3128160

## 2018-12-27 LAB — HEPATITIS B SURFACE ANTIBODY, QUANTITATIVE: Hep B S AB Quant (Post): <5 m[IU]/mL — ABNORMAL LOW (ref >=10)

## 2018-12-27 LAB — HEPATITIS C RNA QUANTITATIVE
HCV Quantitative Log: 6.13 Log IU/mL — ABNORMAL HIGH
HCV RNA, PCR, QN: 1350000 IU/mL — ABNORMAL HIGH

## 2018-12-27 LAB — HEPATITIS B SURFACE ANTIGEN: Hepatitis B Surface Ag: NONREACTIVE

## 2018-12-27 LAB — HIV ANTIBODY (ROUTINE TESTING W REFLEX): HIV 1&2 Ab, 4th Generation: NONREACTIVE

## 2019-01-02 ENCOUNTER — Telehealth: Payer: Self-pay

## 2019-01-02 NOTE — Telephone Encounter (Signed)

## 2019-01-03 ENCOUNTER — Other Ambulatory Visit: Payer: Self-pay

## 2019-01-03 ENCOUNTER — Other Ambulatory Visit (HOSPITAL_COMMUNITY)
Admission: RE | Admit: 2019-01-03 | Discharge: 2019-01-03 | Disposition: A | Discharge status: Home / Self Care | Payer: No Typology Code available for payment source | Source: Ambulatory Visit | Attending: Family Medicine | Admitting: Family Medicine

## 2019-01-03 ENCOUNTER — Ambulatory Visit (INDEPENDENT_AMBULATORY_CARE_PROVIDER_SITE_OTHER): Payer: Self-pay | Admitting: Family Medicine

## 2019-01-03 VITALS — BP 119/77 | HR 80 | Temp 97.7°F | Resp 17 | Wt 156.8 lb

## 2019-01-03 DIAGNOSIS — Z124 Encounter for screening for malignant neoplasm of cervix: Secondary | ICD-10-CM | POA: Insufficient documentation

## 2019-01-03 DIAGNOSIS — Z0001 Encounter for general adult medical examination with abnormal findings: Secondary | ICD-10-CM

## 2019-01-03 DIAGNOSIS — N898 Other specified noninflammatory disorders of vagina: Secondary | ICD-10-CM

## 2019-01-03 NOTE — Progress Notes (Signed)
Subjective:  Patient ID: Katherine Henson, female    DOB: 04/09/1960  Age: 58 y.o. MRN: [id number]  CC: Gynecologic Exam   HPI Katherine Henson is a 58 year old female with a history of hypertension, hyperlipidemia, prediabetes who presents today for a gynecologic exam. Last mammogram last month was negative and she is unable to recall the date of her last Pap smear.  She declined a colonoscopy at her last visit and a stool FIT test card given to her however she is yet to send a specimen. She has no additional concerns today.  Past Medical History:  Diagnosis Date  . Allergy   . Chronic hepatitis C ([place]) 12/06/2018  . HTN (hypertension)   . Hyperlipidemia     History reviewed. No pertinent surgical history.  Family History  Problem Relation Age of Onset  . Stroke Mother   . Diabetes Sister   . Cancer Sister   . Diabetes Brother     Allergies  Allergen Reactions  . Penicillins Anaphylaxis    Did it involve swelling of the face/tongue/throat, SOB, or low BP? No Did it involve sudden or severe rash/hives, skin peeling, or any reaction on the inside of your mouth or nose? Yes Did you need to seek medical attention at a hospital or doctor's office? No When did it last happen?30 years ago If all above answers are "NO", may proceed with cephalosporin use.    Outpatient Medications Prior to Visit  Medication Sig Dispense Refill  . amLODipine (NORVASC) 10 MG tablet Take 1 tablet (10 mg total) by mouth daily. 30 tablet 6  . cetirizine (ZYRTEC) 10 MG tablet Take 1 tablet (10 mg total) by mouth daily. 30 tablet 1  . metoprolol tartrate (LOPRESSOR) 25 MG tablet Take 0.5 tablets (12.5 mg total) by mouth 2 (two) times daily. 60 tablet 6  . simvastatin (ZOCOR) 20 MG tablet Take 1 tablet (20 mg total) by mouth daily at 6 PM. 30 tablet 6   No facility-administered medications prior to visit.      ROS Review of Systems General: negative for fever, weight loss, appetite  change Eyes: no visual symptoms. ENT: no ear symptoms, no sinus tenderness, no nasal congestion or sore throat. Neck: no pain  Respiratory: no wheezing, shortness of breath, cough Cardiovascular: no chest pain, no dyspnea on exertion, no pedal edema, no orthopnea. Gastrointestinal: no abdominal pain, no diarrhea, no constipation Genito-Urinary: no urinary frequency, no dysuria, no polyuria. Hematologic: no bruising Endocrine: no cold or heat intolerance Neurological: no headaches, no seizures, no tremors Musculoskeletal: no joint pains, no joint swelling Skin: no pruritus, no rash. Psychological: no depression, no anxiety,    Objective:  BP 119/77   Pulse 80   Temp 97.7 F (36.5 C) (Temporal)   Resp 17   Wt 156 lb 12.8 oz (71.1 kg)   LMP 09/27/2012   SpO2 97%   BMI 27.78 kg/m   BP/Weight 01/03/2019 12/20/2018 [id number]  Systolic BP [id number]  Diastolic BP 77 88 82  Wt. (Lbs) 156.8 157 154.4  BMI 27.78 27.81 27.35      Physical Exam Constitutional: normal appearing,  Cardiovascular: normal rate and rhythm, normal heart sounds, no murmurs, rub or gallop, no pedal edema Respiratory: Normal breath sounds, clear to auscultation bilaterally, no wheezes, no rales, no rhonchi Abdomen: soft, not tender to palpation, normal bowel sounds, no enlarged organs Musculoskeletal: Full ROM, no tenderness in joints Skin: warm and dry, no lesions. Neurological: alert, oriented x3, cranial  nerves I-XII grossly intact , normal motor strength, normal sensation. Genitourinary: normal appearance of external genitalia, yellowish vaginal discharge, no cervical motion tenderness, normal adnexa Psychological: normal mood.   CMP Latest Ref Rng & Units 12/20/2018 12/06/2018 09/03/2018  Glucose 65 - 99 mg/dL - 102(H) 96  BUN 6 - 24 mg/dL - 12 10  Creatinine 0.57 - 1.00 mg/dL - 0.84 0.77  Sodium 134 - 144 mmol/L - 144 140  Potassium 3.5 - 5.2 mmol/L - 4.6 4.0  Chloride 96 - 106 mmol/L - 104 105   CO2 20 - 29 mmol/L - 24 19(L)  Calcium 8.7 - 10.2 mg/dL - 9.9 9.4  Total Protein 6.0 - 8.5 g/dL - 7.5 7.2  Total Bilirubin 0.0 - 1.2 mg/dL - 0.3 0.3  Alkaline Phos 39 - 117 IU/L - 111 97  AST 0 - 40 IU/L - 26 22  ALT 6 - 29 U/L 21 22 13     Lipid Panel     Component Value Date/Time   CHOL 171 12/06/2018 0859   TRIG 75 12/06/2018 0859   HDL 63 12/06/2018 0859   CHOLHDL 2.7 12/06/2018 0859   CHOLHDL 4.6 05/01/2018 0338   VLDL 52 (H) 05/01/2018 0338   LDLCALC 94 12/06/2018 0859    CBC    Component Value Date/Time   WBC 6.6 04/30/2018 1004   RBC 4.32 04/30/2018 1004   HGB 11.5 (L) 04/30/2018 1004   HCT 38.7 04/30/2018 1004   PLT 249 04/30/2018 1004   MCV 89.6 04/30/2018 1004   MCH 26.6 04/30/2018 1004   MCHC 29.7 (L) 04/30/2018 1004   RDW 14.4 04/30/2018 1004   LYMPHSABS 1.9 04/30/2018 1004   MONOABS 0.5 04/30/2018 1004   EOSABS 0.2 04/30/2018 1004   BASOSABS 0.0 04/30/2018 1004    Lab Results  Component Value Date   HGBA1C 6.1 (H) 12/06/2018    Assessment & Plan:   1. Pap smear for cervical cancer screening Counseled on 150 minutes of exercise per week, healthy eating (including decreased daily intake of saturated fats, cholesterol, added sugars, sodium) - Cytology - PAP([place]) - Cervicovaginal ancillary only  2. Vaginal discharge Will treat as per results of ancillary testing - Cervicovaginal ancillary only   Health Care Maintenance: Cancer screen pending; she is yet to send off her stool cards No orders of the defined types were placed in this encounter.   Follow-up: Return in about 6 months (around 07/04/2019) for chronic medical conditions.       Charlott Rakes, MD, FAAFP. [facility] [place], [place]   01/03/2019, 11:24 AM

## 2019-01-03 NOTE — Progress Notes (Signed)
Patient here for pap smear. No history of abnormal pap per patient. Had mammogram 11/06/2018.

## 2019-01-03 NOTE — Patient Instructions (Signed)

## 2019-01-04 LAB — CERVICOVAGINAL ANCILLARY ONLY
Bacterial Vaginitis (gardnerella): POSITIVE — AB
Candida Glabrata: NEGATIVE
Candida Vaginitis: NEGATIVE
Chlamydia: NEGATIVE
Comment: NEGATIVE
Comment: NEGATIVE
Comment: NEGATIVE
Comment: NEGATIVE
Comment: NEGATIVE
Comment: NORMAL
Neisseria Gonorrhea: NEGATIVE
Trichomonas: POSITIVE — AB

## 2019-01-07 ENCOUNTER — Other Ambulatory Visit: Payer: Self-pay | Admitting: Family Medicine

## 2019-01-07 MED ORDER — METRONIDAZOLE 500 MG PO TABS: 500.0000 mg | ORAL_TABLET | Freq: Two times a day (BID) | ORAL | 0 refills | Status: DC

## 2019-01-07 NOTE — Progress Notes (Signed)
Patient notified of results & recommendations. Expressed understanding.

## 2019-01-09 LAB — CYTOLOGY - PAP
Comment: NEGATIVE
Diagnosis: NEGATIVE
High risk HPV: NEGATIVE

## 2019-01-09 NOTE — Progress Notes (Signed)
Patient notified of results & recommendations. Expressed understanding. Sent letter as well.

## 2019-01-15 ENCOUNTER — Ambulatory Visit (INDEPENDENT_AMBULATORY_CARE_PROVIDER_SITE_OTHER): Payer: Self-pay | Admitting: Infectious Diseases

## 2019-01-15 ENCOUNTER — Other Ambulatory Visit: Payer: Self-pay

## 2019-01-15 ENCOUNTER — Encounter: Payer: Self-pay | Admitting: Infectious Diseases

## 2019-01-15 VITALS — BP 126/79 | HR 61 | Temp 98.1°F | Wt 156.0 lb

## 2019-01-15 DIAGNOSIS — B182 Chronic viral hepatitis C: Secondary | ICD-10-CM

## 2019-01-15 MED ORDER — LEDIPASVIR-SOFOSBUVIR 90-400 MG PO TABS: 1.0000 | ORAL_TABLET | Freq: Every day | ORAL | 1 refills | Status: DC

## 2019-01-15 NOTE — Patient Instructions (Signed)
Take harvoni 1 tab daily, missing no doses. Return to clinic for repeat labs exactly 4 weeks after you start harvoni. Avoid heartburn medications other than tums while on harvoni. Return to clinic in 6 weeks for appointment with Dr. Prince Rome.

## 2019-01-15 NOTE — Progress Notes (Signed)
Katherine Henson  [id number]  1960/11/21    HPI: The patient is a 58 y.o. y/o AA female who presents today for an evaluation for HCV.  She was last seen in our clinic on December 20, 2018.  She recently had a positive hepatitis C antibody on December 15, 2018 as part of a routine age cohort screening process.  Confirmatory labs showed a positive hepatitis C viral load of 1,350,000 IU and infection with genotype 1a.  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  She denied classic risk factors such as past IV drug use, receipt of a blood transfusion, tattoos, body piercings, or known prior hepatitis C exposures.  She does state that her fianc of the past 25 years recently passed away 2 months ago and that they rarely use condoms throughout the relationship.  He was a Scientist, research (life sciences), serving in the late 1970s.  Interestingly, she does recall him stating that he had received vaccinations via a vaccine gun while he was serving in the [employer].  She denies any jaundicing illness in the past.  Her only drug use was remote marijuana use while she was in high school.  She is otherwise without complaints and anxious to begin treatment for hepatitis C.  Past Medical History:  Diagnosis Date  . Allergy   . Chronic hepatitis C ([place]) 12/06/2018  . HTN (hypertension)   . Hyperlipidemia     No past surgical history on file.   Family History  Problem Relation Age of Onset  . Stroke Mother   . Diabetes Sister   . Cancer Sister   . Diabetes Brother      Social History   Tobacco Use  . Smoking status: Former Smoker    Packs/day: 0.50    Years: 30.00    Pack years: 15.00    Quit date: 12/06/2010    Years since quitting: 8.1  . Smokeless tobacco: Never Used  Substance Use Topics  . Alcohol use: Yes    Alcohol/week: 1.0 standard drinks    Types: 1 Glasses of wine per week  . Drug use: Not Currently    Types: Marijuana      reports previously being sexually active.   Allergies  Allergen  Reactions  . Penicillins Anaphylaxis    Did it involve swelling of the face/tongue/throat, SOB, or low BP? No Did it involve sudden or severe rash/hives, skin peeling, or any reaction on the inside of your mouth or nose? Yes Did you need to seek medical attention at a hospital or doctor's office? No When did it last happen?30 years ago If all above answers are "NO", may proceed with cephalosporin use.     Outpatient Medications Prior to Visit  Medication Sig Dispense Refill  . amLODipine (NORVASC) 10 MG tablet Take 1 tablet (10 mg total) by mouth daily. 30 tablet 6  . cetirizine (ZYRTEC) 10 MG tablet Take 1 tablet (10 mg total) by mouth daily. 30 tablet 1  . metoprolol tartrate (LOPRESSOR) 25 MG tablet Take 0.5 tablets (12.5 mg total) by mouth 2 (two) times daily. 60 tablet 6  . simvastatin (ZOCOR) 20 MG tablet Take 1 tablet (20 mg total) by mouth daily at 6 PM. 30 tablet 6  . metroNIDAZOLE (FLAGYL) 500 MG tablet Take 1 tablet (500 mg total) by mouth 2 (two) times daily. (Patient not taking: Reported on 01/15/2019) 14 tablet 0   No facility-administered medications prior to visit.      Review of Systems  Constitutional: Negative  for chills, fatigue and fever.  HENT: Negative for congestion, hearing loss and sinus pressure.   Eyes: Negative for photophobia, discharge, redness and visual disturbance.  Respiratory: Negative for apnea, cough, shortness of breath and wheezing.   Cardiovascular: Negative for chest pain and leg swelling.  Gastrointestinal: Negative for abdominal distention, abdominal pain, constipation, diarrhea, nausea and vomiting.  Endocrine: Negative for cold intolerance, heat intolerance, polydipsia and polyuria.  Genitourinary: Negative for dysuria, flank pain, frequency, urgency, vaginal bleeding and vaginal discharge.  Musculoskeletal: Negative for arthralgias, back pain, joint swelling and neck pain.  Skin: Negative for pallor and rash.  Allergic/Immunologic:  Negative for immunocompromised state.  Neurological: Negative for dizziness, seizures, speech difficulty, weakness and headaches.  Hematological: Does not bruise/bleed easily.  Psychiatric/Behavioral: Negative for agitation, confusion, hallucinations and sleep disturbance. The patient is not nervous/anxious.      Vitals:   01/15/19 0934  BP: 126/79  Pulse: 61  Temp: 98.1 F (36.7 C)     Physical Exam Gen: pleasant, thin, NAD, A&Ox 3 Head: NCAT, no temporal wasting evident EENT: PERRL, EOMI with the exception of RT sided nystagmus noted, MMM, adequate dentition Neck: supple, no JVD CV: NRRR, no murmurs evident Pulm: CTA bilaterally, no wheeze or retractions Abd: soft, NTND, +BS Extrems: no LE edema, 2+ pulses Skin: no rashes, adequate skin turgor Neuro: CN II-XII grossly intact, no focal neurologic deficits appreciated, gait was normal, A&Ox 3  Labs: Lab Results  Component Value Date   HEPBSAG NON-REACTIVE 12/20/2018    Lab Results  Component Value Date   HCVRNAPCRQN 1,350,000 (H) 12/20/2018    Lab Results  Component Value Date   FIBROSTAGE F0 12/20/2018    HCV genotype 1a on 12/10/2018  HCV Ab - positive on 12/06/2018  Qualitative HCV viral load - positive on 12/10/2018  Lab Results  Component Value Date   WBC 6.6 04/30/2018   HGB 11.5 (L) 04/30/2018   HCT 38.7 04/30/2018   MCV 89.6 04/30/2018   PLT 249 04/30/2018       Chemistry      Component Value Date/Time   NA 144 12/06/2018 0859   K 4.6 12/06/2018 0859   CL 104 12/06/2018 0859   CO2 24 12/06/2018 0859   BUN 12 12/06/2018 0859   CREATININE 0.84 12/06/2018 0859      Component Value Date/Time   CALCIUM 9.9 12/06/2018 0859   ALKPHOS 111 12/06/2018 0859   AST 26 12/06/2018 0859   ALT 21 12/20/2018 0956   BILITOT 0.3 12/06/2018 0859        Assessment/Plan: The patient is a 57 year old African-American female with hypertension and hyperlipidemia who presents today for an evaluation for chronic  hepatitis C.  HCV - Her HCV Ab was positive on 12/06/2018, thus confirming past exposure to pathogen. Note: this screening test will remain positive/reactive life-long even if HCV infection has been cured/immunologically cleared. Her most likely risk factor for acquisition was past sexual exposure as her now deceased fianc served in the [employer] and did receive vaccines via a vaccine gun during his service.  She states they rarely use condoms throughout their 25-year relationship.  Chronic infection has been confirmed with an HCV viral load of 1,350,000 IU and HCV genotype of 1 a.  Her HIV Ab was negative, thus excluding co-infection. FibroSURE testing showed F0 fibrosis.  Her hepatitis B surface antigen and hepatitis B surface antibody were both negative.  Several DAA treatment options were reviewed with the patient and she is elected to begin  treatment with Harvoni 1 tab p.o. daily for 8-week duration.  The need for 100% medication compliance and side effect profile of Harvoni including 15 to 18% risk of headache which would respond to caffeinated beverages was reviewed with the patient in detail at today's visit.  She was also instructed to avoid PPIs and H2 blockers as best able and if she develops heartburn, then Tums would be the medication least likely to interact with her Harvoni.  Compassionate use paperwork was completed in the office today with the assistance of our pharmacy staff.  I have instructed the patient to begin her medication on a weekday and return exactly 4 weeks from initiation of treatment for repeat HCV viral load testing.  She will follow-up with me in 6 weeks time for reassessment.  Health maintenance - I have counselled the patient extensively re: the need for barrier precautions with sexual activity in order to prevent sexual and/or vertical transmission of HCV. She must continue these practices until 3 months after treatment completion until a sustained virologic response (SVR) has  been confirmed to establish a cure of her infection. She expressed full understanding of these instructions. Vaccination for hepatitis A & B was recommended.

## 2019-01-17 ENCOUNTER — Telehealth: Payer: Self-pay | Admitting: Family Medicine

## 2019-01-17 ENCOUNTER — Other Ambulatory Visit: Payer: Self-pay | Admitting: Family Medicine

## 2019-01-17 NOTE — Telephone Encounter (Signed)
I have checked the [place] website & I don't see any results. I will reach out to patient to discuss mailing a new kit.

## 2019-01-17 NOTE — Telephone Encounter (Signed)
Pt called looking for her Fecal results. I Please follow up if these results are available

## 2019-01-18 ENCOUNTER — Telehealth: Payer: Self-pay | Admitting: Pharmacy Technician

## 2019-01-18 LAB — FECAL OCCULT BLOOD, IMMUNOCHEMICAL: Fecal Occult Bld: POSITIVE — AB

## 2019-01-18 LAB — SPECIMEN STATUS REPORT

## 2019-01-18 NOTE — Telephone Encounter (Signed)
[facility] Patient Advocate Encounter  Completed and sent Support Path application for Harvoni for this patient who is uninsured.    Patient assistance phone number for follow up is [phone].   This encounter will be updated until final determination.   Venida Jarvis. Nadara Mustard [place] Patient [facility] Phone: [phone] Fax:  [phone]

## 2019-01-22 ENCOUNTER — Telehealth: Payer: Self-pay | Admitting: Pharmacist

## 2019-01-22 ENCOUNTER — Other Ambulatory Visit: Payer: Self-pay | Admitting: Pharmacist

## 2019-01-22 ENCOUNTER — Encounter: Payer: Self-pay | Admitting: Pharmacy Technician

## 2019-01-22 DIAGNOSIS — B182 Chronic viral hepatitis C: Secondary | ICD-10-CM

## 2019-01-23 NOTE — Telephone Encounter (Signed)
Thanks Tyler! 

## 2019-01-24 ENCOUNTER — Other Ambulatory Visit: Payer: Self-pay | Admitting: Family Medicine

## 2019-01-24 DIAGNOSIS — R195 Other fecal abnormalities: Secondary | ICD-10-CM

## 2019-01-25 ENCOUNTER — Telehealth: Payer: Self-pay

## 2019-01-25 NOTE — Telephone Encounter (Signed)
Patient name and DOB has been verified Patient was informed of lab results. Patient had no questions.  

## 2019-01-25 NOTE — Telephone Encounter (Signed)
-----   Message from Charlott Rakes, MD sent at 01/24/2019  8:23 AM EST ----- Stool occult test is positive, I have referred her to Gastroenterology

## 2019-01-28 ENCOUNTER — Ambulatory Visit (INDEPENDENT_AMBULATORY_CARE_PROVIDER_SITE_OTHER): Payer: Self-pay | Admitting: Gastroenterology

## 2019-01-28 ENCOUNTER — Other Ambulatory Visit (INDEPENDENT_AMBULATORY_CARE_PROVIDER_SITE_OTHER): Payer: No Typology Code available for payment source

## 2019-01-28 ENCOUNTER — Encounter: Payer: Self-pay | Admitting: Gastroenterology

## 2019-01-28 ENCOUNTER — Other Ambulatory Visit: Payer: Self-pay

## 2019-01-28 VITALS — BP 128/90 | HR 96 | Temp 97.9°F | Ht 61.0 in | Wt 155.2 lb

## 2019-01-28 DIAGNOSIS — R195 Other fecal abnormalities: Secondary | ICD-10-CM

## 2019-01-28 DIAGNOSIS — Z1159 Encounter for screening for other viral diseases: Secondary | ICD-10-CM

## 2019-01-28 DIAGNOSIS — D649 Anemia, unspecified: Secondary | ICD-10-CM

## 2019-01-28 LAB — CBC WITH DIFFERENTIAL/PLATELET
Basophils Absolute: 0 10*3/uL (ref 0.0–0.1)
Basophils Relative: 0.5 % (ref 0.0–3.0)
Eosinophils Absolute: 0.1 10*3/uL (ref 0.0–0.7)
Eosinophils Relative: 1.4 % (ref 0.0–5.0)
HCT: 38 % (ref 36.0–46.0)
Hemoglobin: 12.4 g/dL (ref 12.0–15.0)
Lymphocytes Relative: 25.5 % (ref 12.0–46.0)
Lymphs Abs: 2.3 10*3/uL (ref 0.7–4.0)
MCHC: 32.5 g/dL (ref 30.0–36.0)
MCV: 83.4 fl (ref 78.0–100.0)
Monocytes Absolute: 0.6 10*3/uL (ref 0.1–1.0)
Monocytes Relative: 6.8 % (ref 3.0–12.0)
Neutro Abs: 5.9 10*3/uL (ref 1.4–7.7)
Neutrophils Relative %: 65.8 % (ref 43.0–77.0)
Platelets: 345 10*3/uL (ref 150.0–400.0)
RBC: 4.56 Mil/uL (ref 3.87–5.11)
RDW: 15.4 % (ref 11.5–15.5)
WBC: 8.9 10*3/uL (ref 4.0–10.5)

## 2019-01-28 LAB — IBC PANEL
Iron: 87 ug/dL (ref 42–145)
Saturation Ratios: 18.1 % — ABNORMAL LOW (ref 20.0–50.0)
Transferrin: 344 mg/dL (ref 212.0–360.0)

## 2019-01-28 LAB — IBC + FERRITIN
Ferritin: 53.1 ng/mL (ref 10.0–291.0)
Iron: 87 ug/dL (ref 42–145)
Saturation Ratios: 18.1 % — ABNORMAL LOW (ref 20.0–50.0)
Transferrin: 344 mg/dL (ref 212.0–360.0)

## 2019-01-28 MED ORDER — NA SULFATE-K SULFATE-MG SULF 17.5-3.13-1.6 GM/177ML PO SOLN: 1.0000 | Freq: Once | ORAL | 0 refills | Status: AC

## 2019-01-28 NOTE — Patient Instructions (Addendum)
Thank you for coming to see me today. I have recommended a colonoscopy to further evaluate the blood in your stool. If your labs today show iron deficiency, I recommend an upper endoscopy at the time of her colonoscopy.  Tips for colonoscopy:  - Stay well hydrated for 3-4 days prior to the exam. This reduces nausea and dehydration.  - To prevent skin/hemorrhoid irritation - prior to wiping, put A&Dointment or vaseline on the toilet paper. - Keep a towel or pad on the bed.  - Drink  64oz of clear liquids in the morning of prep day (prior to starting the prep) to be sure that there is enough fluid to flush the colon and stay hydrated!!!! This is in addition to the fluids required for preparation. - Use of a flavored hard candy, such as grape Anise Salvo, can counteract some of the flavor of the prep and may prevent some nausea.    Please go to the lab on your way out today.  They are located in the basement of our building.  Hit "B" on the elevator.  When the doors open the lab will be on your left.  If you are age 24 or older, your body mass index should be between 23-30. Your Body mass index is 29.33 kg/m. If this is out of the aforementioned range listed, please consider follow up with your Primary Care Provider.  If you are age 15 or younger, your body mass index should be between 19-25. Your Body mass index is 29.33 kg/m. If this is out of the aformentioned range listed, please consider follow up with your Primary Care Provider.   You have been scheduled for a colonoscopy. Please follow written instructions given to you at your visit today.  Please pick up your prep supplies at the pharmacy within the next 1-3 days. If you use inhalers (even only as needed), please bring them with you on the day of your procedure.   Thank you for choosing me and [facility].  Dr. Thornton Park

## 2019-01-28 NOTE — Progress Notes (Signed)
Referring Provider: Charlott Rakes, MD Primary Care Physician:  Scot Jun, FNP  Reason for Consultation: Stool occult positive  IMPRESSION: Hemoccult + stools without overt bleeding or localizing symptoms Normocytic anemia Weekly NSAIDs for headaches/arthralgias Hepatitis C currently on treatment with Harvoni    -Fiber sure consistent with F0 fibrosis  Colonoscopy recommended to evaluate for polyp, mass, vascular malformation, ulcer, as the cause of her asymptomatic occult blood in the stools.  She has no localizing symptoms.  Labs from February 2020 show a normocytic anemia.  Will evaluate for iron deficiency.  If iron deficiency is identified on her lab work today will also pursue upper endoscopy.   PLAN: CBC, ferritin, iron, transferrin saturation Colonoscopy Add EGD if evidence for iron deficiency anemia  Please see the "Patient Instructions" section for addition details about the plan.  HPI: Katherine Henson is a 58 y.o. female referred by Dr. Margarita Rana for further evaluation of occult blood in the stool.  The history is obtained to the patient and review of her electronic health record. Currently on week 2 of a planned 8 weeks of Harvoni for genotype 1a HCV with a pretreatment viral load of 1,350,000 IU.  FibroSure testing showed F0 fibrosis.  No prior liver biopsy.   Found to have occult + stools during a routine evaluation. No fatigue, weakness, headache, irritability, exercise intolerance, exertional dyspnea, vertigo, or angina pectoris.  No pica.  No beeturia.  No hearing loss.    No overt GI blood loss. No melena, hematochezia, bright red blood per rectum. No epistaxis, vaginal bleeding, hemoptysis, or hematuria.   No identified exacerbating or relieving features.  Uses ibuprofen PRN for headaches not to exceed once weekly.   Labs 04/30/18:  hgb 11.5, MCV 89.6, RDW 14.4, platelets 249  No known family history of colon cancer or polyps. No family history of  uterine/endometrial cancer, pancreatic cancer or gastric/stomach cancer.   Past Medical History:  Diagnosis Date  . Allergy   . Chronic hepatitis C ([place]) 12/06/2018  . HTN (hypertension)   . Hyperlipidemia     No past surgical history on file.  Current Outpatient Medications  Medication Sig Dispense Refill  . amLODipine (NORVASC) 10 MG tablet Take 1 tablet (10 mg total) by mouth daily. 30 tablet 6  . cetirizine (ZYRTEC) 10 MG tablet Take 1 tablet (10 mg total) by mouth daily. 30 tablet 1  . Ledipasvir-Sofosbuvir (HARVONI) 90-400 MG TABS Take 1 tablet by mouth daily. 28 tablet 1  . metoprolol tartrate (LOPRESSOR) 25 MG tablet Take 0.5 tablets (12.5 mg total) by mouth 2 (two) times daily. 60 tablet 6  . metroNIDAZOLE (FLAGYL) 500 MG tablet Take 1 tablet (500 mg total) by mouth 2 (two) times daily. (Patient not taking: Reported on 01/15/2019) 14 tablet 0  . simvastatin (ZOCOR) 20 MG tablet Take 1 tablet (20 mg total) by mouth daily at 6 PM. 30 tablet 6   No current facility-administered medications for this visit.     Allergies as of 01/28/2019 - Review Complete 01/03/2019  Allergen Reaction Noted  . Penicillins Anaphylaxis 01/29/2011    Family History  Problem Relation Age of Onset  . Stroke Mother   . Diabetes Sister   . Cancer Sister   . Diabetes Brother     Social History   Socioeconomic History  . Marital status: Single    Spouse name: Not on file  . Number of children: Not on file  . Years of education: Not on file  .  Highest education level: Not on file  Occupational History  . Occupation: housekeeper  Social Needs  . Financial resource strain: Not on file  . Food insecurity    Worry: Not on file    Inability: Not on file  . Transportation needs    Medical: Not on file    Non-medical: Not on file  Tobacco Use  . Smoking status: Former Smoker    Packs/day: 0.50    Years: 30.00    Pack years: 15.00    Quit date: 12/06/2010    Years since quitting: 8.1   . Smokeless tobacco: Never Used  Substance and Sexual Activity  . Alcohol use: Yes    Alcohol/week: 1.0 standard drinks    Types: 1 Glasses of wine per week  . Drug use: Not Currently    Types: Marijuana  . Sexual activity: Not Currently  Lifestyle  . Physical activity    Days per week: Not on file    Minutes per session: Not on file  . Stress: Not on file  Relationships  . Social Herbalist on phone: Not on file    Gets together: Not on file    Attends religious service: Not on file    Active member of club or organization: Not on file    Attends meetings of clubs or organizations: Not on file    Relationship status: Not on file  . Intimate partner violence    Fear of current or ex partner: Not on file    Emotionally abused: Not on file    Physically abused: Not on file    Forced sexual activity: Not on file  Other Topics Concern  . Not on file  Social History Narrative  . Not on file    Review of Systems: 12 system ROS is negative except as noted above with the exceptions of back pain and depression.   Physical Exam: General:   Alert,  well-nourished, pleasant and cooperative in NAD Head:  Normocephalic and atraumatic. Eyes:  Sclera clear, no icterus.   Conjunctiva pink. Ears:  Normal auditory acuity. Nose:  No deformity, discharge,  or lesions. Mouth:  No deformity or lesions.   Neck:  Supple; no masses or thyromegaly. Lungs:  Clear throughout to auscultation.   No wheezes. Heart:  Regular rate and rhythm; no murmurs. Abdomen:  Soft,nontender, nondistended, normal bowel sounds, no rebound or guarding. No hepatosplenomegaly.   Rectal:  Deferred  Msk:  Symmetrical. No boney deformities LAD: No inguinal or umbilical LAD Extremities:  No clubbing or edema. Neurologic:  Alert and  oriented x4;  grossly nonfocal Skin:  Intact without significant lesions or rashes. Psych:  Alert and cooperative. Normal mood and affect.     Damien Cisar L. Tarri Glenn, MD,  MPH 01/28/2019, 1:58 PM

## 2019-02-11 ENCOUNTER — Encounter: Payer: No Typology Code available for payment source | Admitting: Gastroenterology

## 2019-02-20 ENCOUNTER — Other Ambulatory Visit: Payer: Self-pay

## 2019-02-22 ENCOUNTER — Other Ambulatory Visit: Payer: Self-pay

## 2019-02-22 DIAGNOSIS — B182 Chronic viral hepatitis C: Secondary | ICD-10-CM

## 2019-02-26 ENCOUNTER — Ambulatory Visit: Payer: Self-pay | Admitting: Infectious Diseases

## 2019-02-27 ENCOUNTER — Other Ambulatory Visit: Payer: Self-pay

## 2019-02-27 ENCOUNTER — Encounter: Payer: Self-pay | Admitting: Internal Medicine

## 2019-02-27 ENCOUNTER — Ambulatory Visit (INDEPENDENT_AMBULATORY_CARE_PROVIDER_SITE_OTHER): Payer: Self-pay | Admitting: Internal Medicine

## 2019-02-27 VITALS — BP 131/82 | HR 67 | Wt 157.8 lb

## 2019-02-27 DIAGNOSIS — B182 Chronic viral hepatitis C: Secondary | ICD-10-CM

## 2019-02-27 DIAGNOSIS — Z5181 Encounter for therapeutic drug level monitoring: Secondary | ICD-10-CM

## 2019-02-27 NOTE — Assessment & Plan Note (Addendum)
She is doing well with treatment and will continue with 8 weeks.   She will follow up in 6 weeks for a lab and 2 weeks later with me for EOT lab Her Fibrosure is F0 and no lab concerns for advanced liver disease making her risk of cirrhosis and HCC low.  No indication for [place] screening.  HCV RNA pending

## 2019-02-27 NOTE — Assessment & Plan Note (Signed)
cmp looks good, no concerns.

## 2019-02-27 NOTE — Progress Notes (Signed)
   Subjective:    Patient ID: Katherine Henson, female    DOB: May 06, 1960, 58 y.o.   MRN: [id number]  HPI Here for follow up of chronic hepatitis C.  She has genotype 1a and started Harvoni for a planned 8 weeks about 4 weeks ago. She had early labs with a normal CMP.  Viral load is pending.  Normal platelets.  No new concerns.  No associated fatigue or headache.  No rash. No missed doses.   Review of Systems  Constitutional: Negative for fatigue.  Skin: Negative for rash.  Neurological: Negative for light-headedness and headaches.       Objective:   Physical Exam Constitutional:      Appearance: Normal appearance.  Eyes:     General: No scleral icterus. Cardiovascular:     Rate and Rhythm: Normal rate and regular rhythm.     Heart sounds: No murmur.  Pulmonary:     Effort: Pulmonary effort is normal.  Neurological:     Mental Status: She is alert.  Psychiatric:        Mood and Affect: Mood normal.    SH: no alcohol       Assessment & Plan:

## 2019-03-03 LAB — COMPREHENSIVE METABOLIC PANEL
AG Ratio: 1.1 (calc) (ref 1.0–2.5)
ALT: 15 U/L (ref 6–29)
AST: 18 U/L (ref 10–35)
Albumin: 4 g/dL (ref 3.6–5.1)
Alkaline phosphatase (APISO): 94 U/L (ref 37–153)
BUN: 16 mg/dL (ref 7–25)
CO2: 23 mmol/L (ref 20–32)
Calcium: 10 mg/dL (ref 8.6–10.4)
Chloride: 109 mmol/L (ref 98–110)
Creat: 0.95 mg/dL (ref 0.50–1.05)
Globulin: 3.5 g/dL (calc) (ref 1.9–3.7)
Glucose, Bld: 127 mg/dL — ABNORMAL HIGH (ref 65–99)
Potassium: 4.2 mmol/L (ref 3.5–5.3)
Sodium: 143 mmol/L (ref 135–146)
Total Bilirubin: 0.3 mg/dL (ref 0.2–1.2)
Total Protein: 7.5 g/dL (ref 6.1–8.1)

## 2019-03-03 LAB — HEPATITIS C RNA QUANTITATIVE
HCV Quantitative Log: <1.18 Log IU/mL
HCV RNA, PCR, QN: <15 IU/mL

## 2019-03-12 ENCOUNTER — Telehealth: Payer: Self-pay | Admitting: Gastroenterology

## 2019-03-12 MED ORDER — NA SULFATE-K SULFATE-MG SULF 17.5-3.13-1.6 GM/177ML PO SOLN: 1.0000 | ORAL | 0 refills | Status: DC

## 2019-03-12 NOTE — Telephone Encounter (Signed)
Spoke with patient prep sent into pharmacy.

## 2019-03-12 NOTE — Telephone Encounter (Signed)
Pt needs suprep sent to [facility] on [address]

## 2019-03-15 ENCOUNTER — Other Ambulatory Visit: Payer: Self-pay | Admitting: Gastroenterology

## 2019-03-15 ENCOUNTER — Ambulatory Visit (INDEPENDENT_AMBULATORY_CARE_PROVIDER_SITE_OTHER): Payer: Self-pay

## 2019-03-15 DIAGNOSIS — Z1159 Encounter for screening for other viral diseases: Secondary | ICD-10-CM

## 2019-03-15 LAB — SARS CORONAVIRUS 2 (TAT 6-24 HRS): SARS Coronavirus 2: NEGATIVE

## 2019-03-19 ENCOUNTER — Encounter: Payer: Self-pay | Admitting: Gastroenterology

## 2019-03-19 ENCOUNTER — Other Ambulatory Visit: Payer: Self-pay

## 2019-03-19 ENCOUNTER — Ambulatory Visit (AMBULATORY_SURGERY_CENTER): Payer: Self-pay | Admitting: Gastroenterology

## 2019-03-19 VITALS — BP 107/74 | HR 56 | Temp 98.3°F | Resp 16 | Ht 61.0 in | Wt 155.0 lb

## 2019-03-19 DIAGNOSIS — K635 Polyp of colon: Secondary | ICD-10-CM

## 2019-03-19 DIAGNOSIS — K648 Other hemorrhoids: Secondary | ICD-10-CM

## 2019-03-19 DIAGNOSIS — D125 Benign neoplasm of sigmoid colon: Secondary | ICD-10-CM

## 2019-03-19 DIAGNOSIS — R195 Other fecal abnormalities: Secondary | ICD-10-CM

## 2019-03-19 MED ORDER — SODIUM CHLORIDE 0.9 % IV SOLN: 500.0000 mL | Freq: Once | INTRAVENOUS | Status: DC

## 2019-03-19 NOTE — Patient Instructions (Signed)
YOU HAD AN ENDOSCOPIC PROCEDURE TODAY AT [facility]:   Refer to the procedure report that was given to you for any specific questions about what was found during the examination.  If the procedure report does not answer your questions, please call your gastroenterologist to clarify.  If you requested that your care partner not be given the details of your procedure findings, then the procedure report has been included in a sealed envelope for you to review at your convenience later.  YOU SHOULD EXPECT: Some feelings of bloating in the abdomen. Passage of more gas than usual.  Walking can help get rid of the air that was put into your GI tract during the procedure and reduce the bloating. If you had a lower endoscopy (such as a colonoscopy or flexible sigmoidoscopy) you may notice spotting of blood in your stool or on the toilet paper. If you underwent a bowel prep for your procedure, you may not have a normal bowel movement for a few days.  Please Note:  You might notice some irritation and congestion in your nose or some drainage.  This is from the oxygen used during your procedure.  There is no need for concern and it should clear up in a day or so.  SYMPTOMS TO REPORT IMMEDIATELY:   Following lower endoscopy (colonoscopy or flexible sigmoidoscopy):  Excessive amounts of blood in the stool  Significant tenderness or worsening of abdominal pains  Swelling of the abdomen that is new, acute  Fever of 100F or higher    For urgent or emergent issues, a gastroenterologist can be reached at any hour by calling [phone].   DIET:  We do recommend a small meal at first, but then you may proceed to your regular diet.  Drink plenty of fluids but you should avoid alcoholic beverages for 24 hours.  ACTIVITY:  You should plan to take it easy for the rest of today and you should NOT DRIVE or use heavy machinery until tomorrow (because of the sedation medicines used during the test).     FOLLOW UP: Our staff will call the number listed on your records 48-72 hours following your procedure to check on you and address any questions or concerns that you may have regarding the information given to you following your procedure. If we do not reach you, we will leave a message.  We will attempt to reach you two times.  During this call, we will ask if you have developed any symptoms of COVID 19. If you develop any symptoms (ie: fever, flu-like symptoms, shortness of breath, cough etc.) before then, please call [phone].  If you test positive for Covid 19 in the 2 weeks post procedure, please call and report this information to [place].    If any biopsies were taken you will be contacted by phone or by letter within the next 1-3 weeks.  Please call us at [phone] if you have not heard about the biopsies in 3 weeks.    SIGNATURES/CONFIDENTIALITY: You and/or your care partner have signed paperwork which will be entered into your electronic medical record.  These signatures attest to the fact that that the information above on your After Visit Summary has been reviewed and is understood.  Full responsibility of the confidentiality of this discharge information lies with you and/or your care-partner.   Resume medications. Information given on polyps and hemorrhoids.

## 2019-03-19 NOTE — Progress Notes (Signed)
PT taken to PACU. Monitors in place. VSS. Report given to RN. 

## 2019-03-19 NOTE — Progress Notes (Signed)
Pt's states no medical or surgical changes since previsit or office visit. 

## 2019-03-19 NOTE — Op Note (Signed)
[facility] [place] Patient Name: Katherine Henson Procedure Date: 03/19/2019 9:20 AM MRN: [id number] Endoscopist: Thornton Park MD, MD Age: 59 Referring MD:  Date of Birth: [birth year] Gender: Female Account #: [id number] Procedure:                Colonoscopy Indications:              Hemoccult + stools without overt bleeding or                            localizing symptoms                           Normocytic anemia                           - labs 01/28/19: ferritin 53, iron 87, hemoglobin                            12.4                           Weekly NSAIDs for headaches/arthralgias                           No known family history of colon cancer or polyps Medicines:                Monitored Anesthesia Care Procedure:                Pre-Anesthesia Assessment:                           - Prior to the procedure, a History and Physical                            was performed, and patient medications and                            allergies were reviewed. The patient's tolerance of                            previous anesthesia was also reviewed. The risks                            and benefits of the procedure and the sedation                            options and risks were discussed with the patient.                            All questions were answered, and informed consent                            was obtained. Prior Anticoagulants: The patient has                            taken no previous anticoagulant or antiplatelet  agents. ASA Grade Assessment: II - A patient with                            mild systemic disease. After reviewing the risks                            and benefits, the patient was deemed in                            satisfactory condition to undergo the procedure.                           After obtaining informed consent, the colonoscope                            was passed under direct vision. Throughout the                       procedure, the patient's blood pressure, pulse, and                            oxygen saturations were monitored continuously. The                            Colonoscope was introduced through the anus and                            advanced to the 4 cm into the ileum. A second                            forward view of the right colon was performed. The                            colonoscopy was performed without difficulty. The                            patient tolerated the procedure well. The quality                            of the bowel preparation was excellent. The                            terminal ileum, ileocecal valve, appendiceal                            orifice, and rectum were photographed. Scope In: 9:31:50 AM Scope Out: 9:42:29 AM Scope Withdrawal Time: 0 hours 8 minutes 58 seconds  Total Procedure Duration: 0 hours 10 minutes 39 seconds  Findings:                 Hemorrhoids were found on perianal exam.                           A 1 mm polyp was found in the sigmoid colon. The  polyp was sessile. The polyp was removed with a                            cold snare. Resection and retrieval were complete.                            Estimated blood loss was minimal.                           Non-bleeding internal hemorrhoids were found.                           The exam was otherwise without abnormality on                            direct and retroflexion views. Complications:            No immediate complications. Estimated blood loss:                            Minimal. Estimated Blood Loss:     Estimated blood loss was minimal. Impression:               - Hemorrhoids found on perianal exam.                           - One 1 mm polyp in the sigmoid colon, removed with                            a cold snare. Resected and retrieved.                           - Non-bleeding internal hemorrhoids.                           -  The examination was otherwise normal on direct                            and retroflexion views. Recommendation:           - Patient has a contact number available for                            emergencies. The signs and symptoms of potential                            delayed complications were discussed with the                            patient. Return to normal activities tomorrow.                            Written discharge instructions were provided to the                            patient.                           -  Resume previous diet.                           - Continue present medications.                           - Await pathology results.                           - Repeat colonoscopy date to be determined after                            pending pathology results are reviewed for                            surveillance. Thornton Park MD, MD 03/19/2019 9:49:44 AM This report has been signed electronically.

## 2019-03-21 ENCOUNTER — Telehealth: Payer: Self-pay | Admitting: *Deleted

## 2019-03-21 ENCOUNTER — Encounter: Payer: Self-pay | Admitting: Gastroenterology

## 2019-03-21 NOTE — Telephone Encounter (Signed)
  Follow up Call-  Call back number 03/19/2019  Post procedure Call Back phone  # [phone]  Permission to leave phone message Yes  Some recent data might be hidden     Patient questions:  Do you have a fever, pain , or abdominal swelling? No. Pain Score  0 *  Have you tolerated food without any problems? Yes.    Have you been able to return to your normal activities? Yes.    Do you have any questions about your discharge instructions: Diet   No. Medications  No. Follow up visit  No.  Do you have questions or concerns about your Care? No.  Actions: * If pain score is 4 or above: No action needed, pain <4.   1. Have you developed a fever since your procedure? no  2.   Have you had an respiratory symptoms (SOB or cough) since your procedure? no  3.   Have you tested positive for COVID 19 since your procedure no  4.   Have you had any family members/close contacts diagnosed with the COVID 19 since your procedure?  no   If yes to any of these questions please route to Joylene John, RN and Alphonsa Gin, Therapist, sports.

## 2019-04-01 ENCOUNTER — Ambulatory Visit (INDEPENDENT_AMBULATORY_CARE_PROVIDER_SITE_OTHER): Payer: No Typology Code available for payment source | Admitting: Internal Medicine

## 2019-04-01 ENCOUNTER — Encounter: Payer: Self-pay | Admitting: Internal Medicine

## 2019-04-01 DIAGNOSIS — R432 Parageusia: Secondary | ICD-10-CM

## 2019-04-01 NOTE — Progress Notes (Signed)
Virtual Visit via Telephone Note  I connected with Katherine Henson, on 04/01/2019 at 3:02 PM by telephone due to the COVID-19 pandemic and verified that I am speaking with the correct person using two identifiers.   Consent: I discussed the limitations, risks, security and privacy concerns of performing an evaluation and management service by telephone and the availability of in person appointments. I also discussed with the patient that there may be a patient responsible charge related to this service. The patient expressed understanding and agreed to proceed.   Location of Patient: Home   Location of Provider: Clinic    Persons participating in Telemedicine visit: Katherine Henson Katherine Henson [facility] Dr. Juleen China      History of Present Illness: Patient with concerns about loss of taste for >1 year. Has had several negative COVID tests, as recently as 1/8. Denies sinus congestion, heart burn symptoms. No oral lesions or discoloration present in the oral cavity. No head trauma that preceded this occurrence. Denies other neurological deficits including limb weakness, motor/gait abnormalities, vision changes. Denies dizziness and headaches.    Past Medical History:  Diagnosis Date  . Allergy   . Chronic hepatitis C ([place]) 12/06/2018  . Depression   . HTN (hypertension)   . Hyperlipidemia    Allergies  Allergen Reactions  . Penicillins Anaphylaxis    Did it involve swelling of the face/tongue/throat, SOB, or low BP? No Did it involve sudden or severe rash/hives, skin peeling, or any reaction on the inside of your mouth or nose? Yes Did you need to seek medical attention at a hospital or doctor's office? No When did it last happen?30 years ago If all above answers are "NO", may proceed with cephalosporin use.    Current Outpatient Medications on File Prior to Visit  Medication Sig Dispense Refill  . amLODipine (NORVASC) 10 MG tablet Take 1 tablet (10 mg total) by mouth  daily. 30 tablet 6  . metoprolol tartrate (LOPRESSOR) 25 MG tablet Take 0.5 tablets (12.5 mg total) by mouth 2 (two) times daily. 60 tablet 6  . simvastatin (ZOCOR) 20 MG tablet Take 1 tablet (20 mg total) by mouth daily at 6 PM. 30 tablet 6   No current facility-administered medications on file prior to visit.    Observations/Objective: NAD. Speaking clearly.  Work of breathing normal.  Alert and oriented. Mood appropriate.   Assessment and Plan: 1. Loss of taste Unclear etiology at present. Unable to complete oral examination given telephone visit, but based on history does not sound consistent with thrush. Low suspicion for COVID as etiology given several negative tests throughout the year and >1 year of symptoms. Only possibility would be that she was infected from [place] very early in pandemic prior to testing and that by the time she was ever tested she was no longer testing positive and that these are lingering symptoms. Given lack of neurological symptoms and no known head trauma, imaging not warranted at present. Does not sound consistent with chronic sinus dysfunction. Could consider trial of PPI for silent acid reflux. Will obtain some labs screening for derangements that would cause this.  - CBC; Future - Comprehensive metabolic panel; Future - ANA; Future - TSH; Future - Vitamin B12; Future   Follow Up Instructions: Follow up with lab visit.    I discussed the assessment and treatment plan with the patient. The patient was provided an opportunity to ask questions and all were answered. The patient agreed with the plan and demonstrated an  understanding of the instructions.   The patient was advised to call back or seek an in-person evaluation if the symptoms worsen or if the condition fails to improve as anticipated.     I provided 18 minutes total of non-face-to-face time during this encounter including median intraservice time, reviewing previous notes, investigations,  ordering medications, medical decision making, coordinating care and patient verbalized understanding at the end of the visit.    Phill Myron, D.O. Primary Care at [facility]  04/01/2019, 3:02 PM

## 2019-04-03 ENCOUNTER — Other Ambulatory Visit: Payer: Self-pay

## 2019-04-03 ENCOUNTER — Other Ambulatory Visit: Payer: No Typology Code available for payment source

## 2019-04-03 DIAGNOSIS — R432 Parageusia: Secondary | ICD-10-CM

## 2019-04-03 NOTE — Progress Notes (Signed)
Patient here for labs ordered during recent televisit.

## 2019-04-04 ENCOUNTER — Other Ambulatory Visit: Payer: Self-pay | Admitting: Internal Medicine

## 2019-04-04 DIAGNOSIS — R768 Other specified abnormal immunological findings in serum: Secondary | ICD-10-CM

## 2019-04-04 DIAGNOSIS — R432 Parageusia: Secondary | ICD-10-CM

## 2019-04-04 DIAGNOSIS — R7689 Other specified abnormal immunological findings in serum: Secondary | ICD-10-CM

## 2019-04-04 LAB — CBC
Hematocrit: 36.9 % (ref 34.0–46.6)
Hemoglobin: 12.2 g/dL (ref 11.1–15.9)
MCH: 28 pg (ref 26.6–33.0)
MCHC: 33.1 g/dL (ref 31.5–35.7)
MCV: 85 fL (ref 79–97)
Platelets: 316 10*3/uL (ref 150–450)
RBC: 4.35 x10E6/uL (ref 3.77–5.28)
RDW: 14.8 % (ref 11.7–15.4)
WBC: 8.5 10*3/uL (ref 3.4–10.8)

## 2019-04-04 LAB — COMPREHENSIVE METABOLIC PANEL
ALT: 14 IU/L (ref 0–32)
AST: 18 IU/L (ref 0–40)
Albumin/Globulin Ratio: 1.4 (ref 1.2–2.2)
Albumin: 4.2 g/dL (ref 3.8–4.9)
Alkaline Phosphatase: 108 IU/L (ref 39–117)
BUN/Creatinine Ratio: 14 (ref 9–23)
BUN: 12 mg/dL (ref 6–24)
Bilirubin Total: 0.2 mg/dL (ref 0.0–1.2)
CO2: 23 mmol/L (ref 20–29)
Calcium: 9.9 mg/dL (ref 8.7–10.2)
Chloride: 107 mmol/L — ABNORMAL HIGH (ref 96–106)
Creatinine, Ser: 0.86 mg/dL (ref 0.57–1.00)
GFR calc Af Amer: 86 mL/min/{1.73_m2} (ref >59)
GFR calc non Af Amer: 74 mL/min/{1.73_m2} (ref >59)
Globulin, Total: 3 g/dL (ref 1.5–4.5)
Glucose: 98 mg/dL (ref 65–99)
Potassium: 4.5 mmol/L (ref 3.5–5.2)
Sodium: 143 mmol/L (ref 134–144)
Total Protein: 7.2 g/dL (ref 6.0–8.5)

## 2019-04-04 LAB — ANA: Anti Nuclear Antibody (ANA): POSITIVE — AB

## 2019-04-04 LAB — VITAMIN B12: Vitamin B-12: 350 pg/mL (ref 232–1245)

## 2019-04-04 LAB — TSH: TSH: 2.47 u[IU]/mL (ref 0.450–4.500)

## 2019-04-04 NOTE — Progress Notes (Signed)
Patient notified of results & recommendations. Expressed understanding.  Made a lab appointment for 04/09/2019. Will see if ID can draw an SST since she's having labs drawn on 04/08/2019.

## 2019-04-08 ENCOUNTER — Other Ambulatory Visit: Payer: Self-pay

## 2019-04-08 DIAGNOSIS — B182 Chronic viral hepatitis C: Secondary | ICD-10-CM

## 2019-04-09 ENCOUNTER — Other Ambulatory Visit: Payer: No Typology Code available for payment source

## 2019-04-09 DIAGNOSIS — R432 Parageusia: Secondary | ICD-10-CM

## 2019-04-09 DIAGNOSIS — R768 Other specified abnormal immunological findings in serum: Secondary | ICD-10-CM

## 2019-04-09 NOTE — Progress Notes (Signed)
Patient here for additional labs.

## 2019-04-09 NOTE — Addendum Note (Signed)
Addended by: Carylon Perches on: 04/09/2019 10:01 AM   Modules accepted: Orders

## 2019-04-10 ENCOUNTER — Other Ambulatory Visit: Payer: Self-pay | Admitting: Internal Medicine

## 2019-04-10 DIAGNOSIS — R768 Other specified abnormal immunological findings in serum: Secondary | ICD-10-CM

## 2019-04-10 LAB — COMPLETE METABOLIC PANEL WITH GFR
AG Ratio: 1.2 (calc) (ref 1.0–2.5)
ALT: 15 U/L (ref 6–29)
AST: 15 U/L (ref 10–35)
Albumin: 4.3 g/dL (ref 3.6–5.1)
Alkaline phosphatase (APISO): 101 U/L (ref 37–153)
BUN: 14 mg/dL (ref 7–25)
CO2: 27 mmol/L (ref 20–32)
Calcium: 10 mg/dL (ref 8.6–10.4)
Chloride: 107 mmol/L (ref 98–110)
Creat: 0.87 mg/dL (ref 0.50–1.05)
GFR, Est African American: 85 mL/min/{1.73_m2} (ref >=60)
GFR, Est Non African American: 73 mL/min/{1.73_m2} (ref >=60)
Globulin: 3.5 g/dL (calc) (ref 1.9–3.7)
Glucose, Bld: 94 mg/dL (ref 65–99)
Potassium: 4.1 mmol/L (ref 3.5–5.3)
Sodium: 142 mmol/L (ref 135–146)
Total Bilirubin: 0.3 mg/dL (ref 0.2–1.2)
Total Protein: 7.8 g/dL (ref 6.1–8.1)

## 2019-04-10 LAB — ANTI-DNA ANTIBODY, DOUBLE-STRANDED: dsDNA Ab: 28 IU/mL — ABNORMAL HIGH (ref 0–9)

## 2019-04-10 LAB — ANTI-SMITH ANTIBODY: ENA SM Ab Ser-aCnc: <0.2 AI (ref 0.0–0.9)

## 2019-04-10 LAB — HEPATITIS C RNA QUANTITATIVE
HCV Quantitative Log: <1.18 Log IU/mL
HCV RNA, PCR, QN: <15 IU/mL

## 2019-04-10 LAB — SJOGREN'S SYNDROME ANTIBODS(SSA + SSB)
ENA SSA (RO) Ab: <0.2 AI (ref 0.0–0.9)
ENA SSB (LA) Ab: <0.2 AI (ref 0.0–0.9)

## 2019-04-11 ENCOUNTER — Telehealth: Payer: Self-pay | Admitting: Internal Medicine

## 2019-04-11 NOTE — Telephone Encounter (Signed)
Patient notified of lab results. Expressed understanding. Aware that she will be contact about an appointment.

## 2019-04-11 NOTE — Telephone Encounter (Signed)
Patient calling stating that she cannot taste, says everything tastes like chalk. Patient was tested on 02/12/2019 and it was negative. States that she has been tested numerous times while this has been occurring and they are all negative. Concerned about immune system.  Please contact patient regarding labs.

## 2019-04-11 NOTE — Progress Notes (Signed)
Patient notified of results & recommendations. Expressed understanding.

## 2019-04-23 ENCOUNTER — Other Ambulatory Visit: Payer: Self-pay

## 2019-04-23 ENCOUNTER — Ambulatory Visit (INDEPENDENT_AMBULATORY_CARE_PROVIDER_SITE_OTHER): Payer: Self-pay | Admitting: Internal Medicine

## 2019-04-23 ENCOUNTER — Encounter: Payer: Self-pay | Admitting: Internal Medicine

## 2019-04-23 VITALS — BP 123/83 | HR 71 | Wt 159.0 lb

## 2019-04-23 DIAGNOSIS — I10 Essential (primary) hypertension: Secondary | ICD-10-CM

## 2019-04-23 DIAGNOSIS — B182 Chronic viral hepatitis C: Secondary | ICD-10-CM

## 2019-04-23 NOTE — Assessment & Plan Note (Signed)
She is doing well and EOT lab good with undetectable virus.  Will return in 4 months for [employer] for test of cure

## 2019-04-23 NOTE — Progress Notes (Signed)
   Subjective:    Patient ID: Katherine Henson, female    DOB: 10-30-1960, 59 y.o.   MRN: [id number]  HPI Here for follow up of chronic hepatitis C Completed 8 weeks of harvoni for genotype 1a hepatitis C.  F0 on Fibrosure and no complaints today.     Review of Systems  Constitutional: Negative for fatigue.  Gastrointestinal: Negative for diarrhea and nausea.  Skin: Negative for rash.       Objective:   Physical Exam Constitutional:      Appearance: Normal appearance.  Eyes:     General: No scleral icterus. Pulmonary:     Effort: Pulmonary effort is normal.  Neurological:     General: No focal deficit present.     Mental Status: She is alert.  Psychiatric:        Mood and Affect: Mood normal.           Assessment & Plan:

## 2019-04-23 NOTE — Assessment & Plan Note (Signed)
BP is good today

## 2019-06-14 ENCOUNTER — Ambulatory Visit: Payer: No Typology Code available for payment source | Attending: Internal Medicine

## 2019-06-14 DIAGNOSIS — Z23 Encounter for immunization: Secondary | ICD-10-CM

## 2019-06-14 NOTE — Progress Notes (Signed)
   Covid-19 Vaccination Clinic  Name:  Misaki O Martinique    MRN: [id number] DOB: 03-25-1960  06/14/2019  Ms. Martinique was observed post Covid-19 immunization for 15 minutes without incident. She was provided with Vaccine Information Sheet and instruction to access the V-Safe system.   Ms. Martinique was instructed to call 911 with any severe reactions post vaccine: Marland Kitchen Difficulty breathing  . Swelling of face and throat  . A fast heartbeat  . A bad rash all over body  . Dizziness and weakness   Immunizations Administered    Name Date Dose VIS Date Route   Pfizer COVID-19 Vaccine 06/14/2019 10:45 AM 0.3 mL 02/15/2019 Intramuscular   Manufacturer: [employer], [employer]   Lot: [id number]   Fries: [id number]

## 2019-06-14 NOTE — Progress Notes (Deleted)
Office Visit Note  Patient: Katherine Henson             Date of Birth: October 17, 1960           MRN: [id number]             PCP: Nicolette Bang, DO Referring: Caryl Never* Visit Date: 06/19/2019 Occupation: @GUAROCC @  Subjective:  No chief complaint on file.   History of Present Illness: Katherine Henson is a 59 y.o. female ***   Activities of Daily Living:  Patient reports morning stiffness for *** {minute/hour:19697}.   Patient {ACTIONS;DENIES/REPORTS:21021675::"Denies"} nocturnal pain.  Difficulty dressing/grooming: {ACTIONS;DENIES/REPORTS:21021675::"Denies"} Difficulty climbing stairs: {ACTIONS;DENIES/REPORTS:21021675::"Denies"} Difficulty getting out of chair: {ACTIONS;DENIES/REPORTS:21021675::"Denies"} Difficulty using hands for taps, buttons, cutlery, and/or writing: {ACTIONS;DENIES/REPORTS:21021675::"Denies"}  No Rheumatology ROS completed.   PMFS History:  Patient Active Problem List   Diagnosis Date Noted  . Positive double stranded DNA antibody test 04/10/2019  . Positive ANA (antinuclear antibody) 04/04/2019  . Medication monitoring encounter 02/27/2019  . Chronic hepatitis C without hepatic coma ([place]) 12/06/2018  . HTN (hypertension) 04/30/2018  . [place] 04/30/2018  . Dyspnea 04/30/2018  . Hypertensive urgency 04/30/2018    Past Medical History:  Diagnosis Date  . Allergy   . Chronic hepatitis C ([place]) 12/06/2018  . Depression   . HTN (hypertension)   . Hyperlipidemia     Family History  Problem Relation Age of Onset  . Stroke Mother   . Anuerysm Mother        brain  . Diabetes Sister   . Breast cancer Sister   . Diabetes Brother   . Breast cancer Sister   . Breast cancer Niece   . Prostate cancer Brother    Past Surgical History:  Procedure Laterality Date  . NO PAST SURGERIES     Social History   Social History Narrative  . Not on file   Immunization History  Administered Date(s) Administered  .  Influenza,inj,Quad PF,6+ Mos 05/03/2018, 12/06/2018  . PFIZER SARS-COV-2 Vaccination 06/14/2019     Objective: Vital Signs: LMP 09/27/2012    Physical Exam   Musculoskeletal Exam: ***  CDAI Exam: CDAI Score: -- Patient Global: --; Provider Global: -- Swollen: --; Tender: -- Joint Exam 06/19/2019   No joint exam has been documented for this visit   There is currently no information documented on the homunculus. Go to the Rheumatology activity and complete the homunculus joint exam.  Investigation: No additional findings.  Imaging: No results found.  Recent Labs: Lab Results  Component Value Date   WBC 8.5 04/03/2019   HGB 12.2 04/03/2019   PLT 316 04/03/2019   NA 142 04/08/2019   K 4.1 04/08/2019   CL 107 04/08/2019   CO2 27 04/08/2019   GLUCOSE 94 04/08/2019   BUN 14 04/08/2019   CREATININE 0.87 04/08/2019   BILITOT 0.3 04/08/2019   ALKPHOS 108 04/03/2019   AST 15 04/08/2019   ALT 15 04/08/2019   PROT 7.8 04/08/2019   ALBUMIN 4.2 04/03/2019   CALCIUM 10.0 04/08/2019   GFRAA 85 04/08/2019    Speciality Comments: No specialty comments available.  Procedures:  No procedures performed Allergies: Penicillins   Assessment / Plan:     Visit Diagnoses: No diagnosis found.  Orders: No orders of the defined types were placed in this encounter.  No orders of the defined types were placed in this encounter.   Face-to-face time spent with patient was *** minutes. Greater than 50% of time was spent in  counseling and coordination of care.  Follow-Up Instructions: No follow-ups on file.   Katherine Neas, PA-C  Note - This record has been created using Dragon software.  Chart creation errors have been sought, but may not always  have been located. Such creation errors do not reflect on  the standard of medical care.

## 2019-06-19 ENCOUNTER — Ambulatory Visit: Payer: No Typology Code available for payment source | Admitting: Rheumatology

## 2019-07-03 ENCOUNTER — Telehealth: Payer: Self-pay

## 2019-07-03 NOTE — Telephone Encounter (Signed)
Called patient to do their pre-visit COVID screening.  Call went to voicemail. Unable to do prescreening.  

## 2019-07-03 NOTE — Telephone Encounter (Signed)

## 2019-07-04 ENCOUNTER — Encounter: Payer: Self-pay | Admitting: Internal Medicine

## 2019-07-04 ENCOUNTER — Other Ambulatory Visit: Payer: Self-pay

## 2019-07-04 ENCOUNTER — Ambulatory Visit (INDEPENDENT_AMBULATORY_CARE_PROVIDER_SITE_OTHER): Payer: Self-pay | Admitting: Internal Medicine

## 2019-07-04 VITALS — BP 125/85 | HR 64 | Temp 97.3°F | Resp 17 | Ht 61.0 in | Wt 156.0 lb

## 2019-07-04 DIAGNOSIS — Z5971 Insufficient health insurance coverage: Secondary | ICD-10-CM

## 2019-07-04 DIAGNOSIS — I10 Essential (primary) hypertension: Secondary | ICD-10-CM

## 2019-07-04 DIAGNOSIS — Z5989 Other problems related to housing and economic circumstances: Secondary | ICD-10-CM

## 2019-07-04 DIAGNOSIS — E782 Mixed hyperlipidemia: Secondary | ICD-10-CM

## 2019-07-04 DIAGNOSIS — Z598 Other problems related to housing and economic circumstances: Secondary | ICD-10-CM

## 2019-07-04 DIAGNOSIS — R768 Other specified abnormal immunological findings in serum: Secondary | ICD-10-CM

## 2019-07-04 DIAGNOSIS — R432 Parageusia: Secondary | ICD-10-CM

## 2019-07-04 NOTE — Progress Notes (Signed)
  Subjective:    Katherine Henson - 59 y.o. female MRN [id number]  Date of birth: 25-Oct-1960  HPI  Katherine Henson is here for chronic medical f/u.  Chronic HTN Disease Monitoring:  Home BP Monitoring - Monitors at home. 120-130/70-80 typically. Never goes above [id number] systolic.  Chest pain- no  Dyspnea- no Headache - no  Medications: Metoprolol 12.5 mg BID, Amlodipine 10 mg  Compliance- yes Lightheadedness- no  Edema- no   HYPERLIPIDEMIA  Symptoms Chest pain on exertion:  no    Leg claudication:   no  Medication Monitoring Compliance- Yes, with Simvastatin 20 mg   Right upper quadrant pain- no   Muscle aches- no   Health Maintenance:  Health Maintenance Due  Topic Date Due  . TETANUS/TDAP  03/08/2019  . COVID-19 Vaccine (2 - Pfizer 2-dose series) 07/05/2019    -  reports that she quit smoking about 8 years ago. She has a 15.00 pack-year smoking history. She has never used smokeless tobacco. - Review of Systems: Per HPI. - Past Medical History: Patient Active Problem List   Diagnosis Date Noted  . Positive double stranded DNA antibody test 04/10/2019  . Positive ANA (antinuclear antibody) 04/04/2019  . Medication monitoring encounter 02/27/2019  . Chronic hepatitis C without hepatic coma ([place]) 12/06/2018  . HTN (hypertension) 04/30/2018  . [place] 04/30/2018  . Dyspnea 04/30/2018  . Hypertensive urgency 04/30/2018   - Medications: reviewed and updated   Objective:   Physical Exam BP 125/85   Pulse 64   Temp (!) 97.3 F (36.3 C) (Temporal)   Resp 17   Ht 5\' 1"  (1.549 m)   Wt 156 lb (70.8 kg)   LMP 09/27/2012   SpO2 96%   BMI 29.48 kg/m  Physical Exam  Constitutional: She is oriented to person, place, and time and well-developed, well-nourished, and in no distress. No distress.  HENT:  Head: Normocephalic and atraumatic.  Eyes: Conjunctivae and EOM are normal.  Cardiovascular: Normal rate, regular rhythm and normal heart sounds.  No  murmur heard. Pulmonary/Chest: Effort normal and breath sounds normal. No respiratory distress.  Musculoskeletal:        General: Normal range of motion.  Neurological: She is alert and oriented to person, place, and time.  Skin: Skin is warm and dry. She is not diaphoretic.  Psychiatric: Affect and judgment normal.      Assessment & Plan:   1. Essential hypertension BP is at goal. Continue current therapy regimen.  Counseled on blood pressure goal of less than 130/80, low-sodium, DASH diet, medication compliance, 150 minutes of moderate intensity exercise per week. Discussed medication compliance, adverse effects.  2. Mixed hyperlipidemia Last LDL at goal with result of 81 in June 2020. Continue statin.   3. Does not have health insurance Was unable to be approved for Medicaid. Financial packet given for [employer] financial assistance.   4. Loss of taste 5. Positive ANA (antinuclear antibody) Previously referred to rheumatology but unable to afford office visit. Referral still active so once has financial assistance will set patient up for the consult.     Phill Myron, D.O. 07/04/2019, 11:02 AM Primary Care at [facility]

## 2019-07-09 ENCOUNTER — Ambulatory Visit: Payer: No Typology Code available for payment source

## 2019-07-15 ENCOUNTER — Ambulatory Visit: Payer: No Typology Code available for payment source | Attending: Internal Medicine

## 2019-07-15 DIAGNOSIS — Z23 Encounter for immunization: Secondary | ICD-10-CM

## 2019-07-15 NOTE — Progress Notes (Signed)
   Covid-19 Vaccination Clinic  Name:  Katherine Henson    MRN: [id number] DOB: October 07, 1960  07/15/2019  Ms. Henson was observed post Covid-19 immunization for 30 minutes based on pre-vaccination screening without incident. She was provided with Vaccine Information Sheet and instruction to access the V-Safe system.   Ms. Henson was instructed to call 911 with any severe reactions post vaccine: Marland Kitchen Difficulty breathing  . Swelling of face and throat  . A fast heartbeat  . A bad rash all over body  . Dizziness and weakness   Immunizations Administered    Name Date Dose VIS Date Route   Pfizer COVID-19 Vaccine 07/15/2019 10:28 AM 0.3 mL 05/01/2018 Intramuscular   Manufacturer: [place]   Lot: [id number]   [place]: [id number]

## 2019-07-19 ENCOUNTER — Ambulatory Visit: Payer: No Typology Code available for payment source | Admitting: Rheumatology

## 2019-08-19 ENCOUNTER — Other Ambulatory Visit: Payer: Self-pay

## 2019-08-19 ENCOUNTER — Encounter: Payer: Self-pay | Admitting: Internal Medicine

## 2019-08-19 ENCOUNTER — Ambulatory Visit (INDEPENDENT_AMBULATORY_CARE_PROVIDER_SITE_OTHER): Payer: Self-pay | Admitting: Internal Medicine

## 2019-08-19 VITALS — BP 109/71 | HR 64 | Wt 150.0 lb

## 2019-08-19 DIAGNOSIS — I10 Essential (primary) hypertension: Secondary | ICD-10-CM

## 2019-08-19 DIAGNOSIS — B182 Chronic viral hepatitis C: Secondary | ICD-10-CM

## 2019-08-19 NOTE — Assessment & Plan Note (Signed)
bp wnl.

## 2019-08-19 NOTE — Progress Notes (Signed)
   Subjective:    Patient ID: Katherine Henson, female    DOB: 17-Dec-1960, 59 y.o.   MRN: [id number]  HPI Here for follow up of chronic hepatitis C Completed 8 weeks of Harvoni over 4 months ago for genotype 1a hepatitis C.  F0 on Fibrosure.  No complaints today.  No new issues.    Review of Systems  Constitutional: Negative for fatigue.  Skin: Negative for rash.       Objective:   Physical Exam Constitutional:      Appearance: Normal appearance.  Eyes:     General: No scleral icterus. Pulmonary:     Effort: Pulmonary effort is normal.  Neurological:     General: No focal deficit present.     Mental Status: She is alert.  Psychiatric:        Mood and Affect: Mood normal.           Assessment & Plan:

## 2019-08-19 NOTE — Assessment & Plan Note (Signed)
Will do labs today for SVR 12/test of cure.  If negative, she is considered cured.  I discussed that the antibody will remain positive so she is ineligible to donate blood.  rtc prn

## 2019-08-22 LAB — COMPLETE METABOLIC PANEL WITH GFR
AG Ratio: 1.2 (calc) (ref 1.0–2.5)
ALT: 15 U/L (ref 6–29)
AST: 16 U/L (ref 10–35)
Albumin: 4.1 g/dL (ref 3.6–5.1)
Alkaline phosphatase (APISO): 99 U/L (ref 37–153)
BUN: 14 mg/dL (ref 7–25)
CO2: 25 mmol/L (ref 20–32)
Calcium: 10 mg/dL (ref 8.6–10.4)
Chloride: 109 mmol/L (ref 98–110)
Creat: 0.96 mg/dL (ref 0.50–1.05)
GFR, Est African American: 75 mL/min/{1.73_m2} (ref >=60)
GFR, Est Non African American: 65 mL/min/{1.73_m2} (ref >=60)
Globulin: 3.3 g/dL (calc) (ref 1.9–3.7)
Glucose, Bld: 116 mg/dL — ABNORMAL HIGH (ref 65–99)
Potassium: 4.3 mmol/L (ref 3.5–5.3)
Sodium: 141 mmol/L (ref 135–146)
Total Bilirubin: 0.3 mg/dL (ref 0.2–1.2)
Total Protein: 7.4 g/dL (ref 6.1–8.1)

## 2019-08-22 LAB — HEPATITIS C RNA QUANTITATIVE
HCV Quantitative Log: <1.18 Log IU/mL
HCV RNA, PCR, QN: <15 IU/mL

## 2019-08-26 ENCOUNTER — Other Ambulatory Visit: Payer: Self-pay | Admitting: Family Medicine

## 2019-08-26 DIAGNOSIS — I10 Essential (primary) hypertension: Secondary | ICD-10-CM

## 2019-08-28 ENCOUNTER — Telehealth: Payer: Self-pay | Admitting: Internal Medicine

## 2019-08-28 ENCOUNTER — Other Ambulatory Visit: Payer: Self-pay | Admitting: Family Medicine

## 2019-08-28 DIAGNOSIS — E782 Mixed hyperlipidemia: Secondary | ICD-10-CM

## 2019-08-28 DIAGNOSIS — I10 Essential (primary) hypertension: Secondary | ICD-10-CM

## 2019-08-28 MED ORDER — METOPROLOL TARTRATE 25 MG PO TABS: 12.5000 mg | ORAL_TABLET | Freq: Two times a day (BID) | ORAL | 2 refills | Status: DC

## 2019-08-28 MED ORDER — AMLODIPINE BESYLATE 10 MG PO TABS: 10.0000 mg | ORAL_TABLET | Freq: Every day | ORAL | 2 refills | Status: DC

## 2019-08-28 MED ORDER — SIMVASTATIN 20 MG PO TABS: 20.0000 mg | ORAL_TABLET | Freq: Every day | ORAL | 2 refills | Status: DC

## 2019-08-28 NOTE — Telephone Encounter (Signed)
1) Medication(s) Requested (by name):amLODipine (NORVASC) 10 MG tablet [[id number]]    2) [facility] [place], [place], [place]   3) Special Requests:   Approved medications will be sent to the pharmacy, we will reach out if there is an issue.  Requests made after 3pm may not be addressed until the following business day!  If a patient is unsure of the name of the medication(s) please note and ask patient to call back when they are able to provide all info, do not send to responsible party until all information is available!

## 2019-10-21 ENCOUNTER — Other Ambulatory Visit: Payer: Self-pay

## 2019-10-21 ENCOUNTER — Emergency Department (HOSPITAL_COMMUNITY)
Admission: EM | Admit: 2019-10-21 | Discharge: 2019-10-22 | Disposition: A | Discharge status: Home / Self Care | Payer: Self-pay | Attending: Emergency Medicine | Admitting: Emergency Medicine

## 2019-10-21 ENCOUNTER — Emergency Department (HOSPITAL_COMMUNITY): Payer: Self-pay

## 2019-10-21 ENCOUNTER — Encounter (HOSPITAL_COMMUNITY): Payer: Self-pay

## 2019-10-21 DIAGNOSIS — M79601 Pain in right arm: Secondary | ICD-10-CM | POA: Insufficient documentation

## 2019-10-21 DIAGNOSIS — R202 Paresthesia of skin: Secondary | ICD-10-CM | POA: Insufficient documentation

## 2019-10-21 DIAGNOSIS — Z79899 Other long term (current) drug therapy: Secondary | ICD-10-CM | POA: Insufficient documentation

## 2019-10-21 DIAGNOSIS — I10 Essential (primary) hypertension: Secondary | ICD-10-CM | POA: Insufficient documentation

## 2019-10-21 DIAGNOSIS — R200 Anesthesia of skin: Secondary | ICD-10-CM

## 2019-10-21 DIAGNOSIS — R0789 Other chest pain: Secondary | ICD-10-CM | POA: Insufficient documentation

## 2019-10-21 DIAGNOSIS — Z87891 Personal history of nicotine dependence: Secondary | ICD-10-CM | POA: Insufficient documentation

## 2019-10-21 DIAGNOSIS — R531 Weakness: Secondary | ICD-10-CM | POA: Insufficient documentation

## 2019-10-21 LAB — TROPONIN I (HIGH SENSITIVITY)
Troponin I (High Sensitivity): 3 ng/L (ref <18)
Troponin I (High Sensitivity): 5 ng/L (ref <18)

## 2019-10-21 LAB — BASIC METABOLIC PANEL
Anion gap: 7 (ref 5–15)
BUN: 11 mg/dL (ref 6–20)
CO2: 24 mmol/L (ref 22–32)
Calcium: 10 mg/dL (ref 8.9–10.3)
Chloride: 110 mmol/L (ref 98–111)
Creatinine, Ser: 0.88 mg/dL (ref 0.44–1.00)
GFR calc Af Amer: >60 mL/min (ref >60)
GFR calc non Af Amer: >60 mL/min (ref >60)
Glucose, Bld: 96 mg/dL (ref 70–99)
Potassium: 3.4 mmol/L — ABNORMAL LOW (ref 3.5–5.1)
Sodium: 141 mmol/L (ref 135–145)

## 2019-10-21 LAB — CBC
HCT: 38.7 % (ref 36.0–46.0)
Hemoglobin: 11.9 g/dL — ABNORMAL LOW (ref 12.0–15.0)
MCH: 26.8 pg (ref 26.0–34.0)
MCHC: 30.7 g/dL (ref 30.0–36.0)
MCV: 87.2 fL (ref 80.0–100.0)
Platelets: 282 10*3/uL (ref 150–400)
RBC: 4.44 MIL/uL (ref 3.87–5.11)
RDW: 15.6 % — ABNORMAL HIGH (ref 11.5–15.5)
WBC: 7.6 10*3/uL (ref 4.0–10.5)
nRBC: 0 % (ref 0.0–0.2)

## 2019-10-21 NOTE — ED Triage Notes (Signed)
Pt reports centralized chest pain that radiates to her left arm that started yesterday with right hand numbness. Pt now having numbness in her left arm and not in her right. Denies any sob or unilateral weakness. Pt a.o

## 2019-10-22 ENCOUNTER — Emergency Department (HOSPITAL_COMMUNITY): Payer: Self-pay

## 2019-10-22 NOTE — ED Provider Notes (Signed)
[facility] EMERGENCY DEPARTMENT Provider Note   CSN: [id number] Arrival date & time: 10/21/19  1243     History Chief Complaint  Patient presents with  . Chest Pain  . Numbness    Katherine Henson is a 59 y.o. female.  HPI    Patient presents with concern of chest pain and numbness. Patient was in her usual state of health until awakening yesterday, with pain in the central chest, with radiation to the left arm. This has subsequently resolved, without clear intervention. However, soon after the patient developed chest pain, she noticed numbness in her right arm.  After that sensation was present for some time and it migrated to the left side, she currently complains of numbness in her left arm and leg. She denies facial numbness, speech difficulty, confusion, vision changes. Symptoms are waxing, waning in severity, but mildly persistent since yesterday. No new medication, diet, activity.  Patient does have recent diagnosis of lupus, it is unclear if she has started any medication for this.  Past Medical History:  Diagnosis Date  . Allergy   . Chronic hepatitis C ([place]) 12/06/2018  . Depression   . HTN (hypertension)   . Hyperlipidemia     Patient Active Problem List   Diagnosis Date Noted  . Positive double stranded DNA antibody test 04/10/2019  . Positive ANA (antinuclear antibody) 04/04/2019  . Chronic hepatitis C without hepatic coma ([place]) 12/06/2018  . HTN (hypertension) 04/30/2018  . [place] 04/30/2018  . Dyspnea 04/30/2018  . Hypertensive urgency 04/30/2018    Past Surgical History:  Procedure Laterality Date  . NO PAST SURGERIES       OB History   No obstetric history on file.     Family History  Problem Relation Age of Onset  . Stroke Mother   . Anuerysm Mother        brain  . Diabetes Sister   . Breast cancer Sister   . Diabetes Brother   . Breast cancer Sister   . Breast cancer Niece   . Prostate cancer Brother      Social History   Tobacco Use  . Smoking status: Former Smoker    Packs/day: 0.50    Years: 30.00    Pack years: 15.00    Quit date: 12/06/2010    Years since quitting: 8.8  . Smokeless tobacco: Never Used  Substance Use Topics  . Alcohol use: Yes    Alcohol/week: 1.0 standard drink    Types: 1 Glasses of wine per week    Comment: occasional  . Drug use: Not Currently    Types: Marijuana    Home Medications Prior to Admission medications   Medication Sig Start Date End Date Taking? Authorizing Provider  amLODipine (NORVASC) 10 MG tablet Take 1 tablet (10 mg total) by mouth daily. 08/28/19  Yes Nicolette Bang, DO  metoprolol tartrate (LOPRESSOR) 25 MG tablet Take 0.5 tablets (12.5 mg total) by mouth 2 (two) times daily. 08/28/19  Yes Nicolette Bang, DO  simvastatin (ZOCOR) 20 MG tablet Take 1 tablet (20 mg total) by mouth daily at 6 PM. 08/28/19  Yes Nicolette Bang, DO    Allergies    Penicillins  Review of Systems   Review of Systems  Constitutional:       Per HPI, otherwise negative  HENT:       Per HPI, otherwise negative  Respiratory:       Per HPI, otherwise negative  Cardiovascular:  Per HPI, otherwise negative  Gastrointestinal: Negative for vomiting.  Endocrine:       Negative aside from HPI  Genitourinary:       Neg aside from HPI   Musculoskeletal:       Per HPI, otherwise negative  Skin: Negative.   Neurological: Positive for weakness and numbness. Negative for syncope.    Physical Exam Updated Vital Signs BP (!) 145/90 (BP Location: Right Arm)   Pulse 69   Temp 98 F (36.7 C) (Oral)   Resp 18   Ht 5' (1.524 m)   Wt 68 kg   LMP 09/27/2012   SpO2 100%   BMI 29.29 kg/m   Physical Exam Vitals and nursing note reviewed.  Constitutional:      General: She is not in acute distress.    Appearance: She is well-developed.  HENT:     Head: Normocephalic and atraumatic.  Eyes:     Conjunctiva/sclera:  Conjunctivae normal.  Cardiovascular:     Rate and Rhythm: Normal rate and regular rhythm.  Pulmonary:     Effort: Pulmonary effort is normal. No respiratory distress.     Breath sounds: Normal breath sounds. No stridor.  Abdominal:     General: There is no distension.  Skin:    General: Skin is warm and dry.  Neurological:     Mental Status: She is alert and oriented to person, place, and time.     Cranial Nerves: Cranial nerves are intact. No cranial nerve deficit.     Sensory: Sensation is intact.     Motor: Motor function is intact.     Coordination: Coordination is intact.     ED Results / Procedures / Treatments   Labs (all labs ordered are listed, but only abnormal results are displayed) Labs Reviewed  BASIC METABOLIC PANEL - Abnormal; Notable for the following components:      Result Value   Potassium 3.4 (*)    All other components within normal limits  CBC - Abnormal; Notable for the following components:   Hemoglobin 11.9 (*)    RDW 15.6 (*)    All other components within normal limits  TROPONIN I (HIGH SENSITIVITY)  TROPONIN I (HIGH SENSITIVITY)    EKG EKG Interpretation  Date/Time:  Monday October 21 2019 12:51:59 EDT Ventricular Rate:  62 PR Interval:  154 QRS Duration: 76 QT Interval:  388 QTC Calculation: 393 R Axis:   -6 Text Interpretation: Normal sinus rhythm with sinus arrhythmia Minimal voltage criteria for LVH, may be normal variant ( R in aVL ) Artifact Abnormal ECG Confirmed by Carmin Muskrat [phone]) on 10/22/2019 8:49:18 AM   Radiology DG Chest 2 View  Result Date: 10/21/2019 CLINICAL DATA:  Onset chest pain and right hand numbness yesterday. EXAM: CHEST - 2 VIEW COMPARISON:  PA and lateral chest 11/22/2018. FINDINGS: Lungs clear. Heart size normal. No pneumothorax or pleural fluid. No bony abnormality. IMPRESSION: Negative chest. Electronically Signed   By: Inge Rise M.D.   On: 10/21/2019 13:07   MR BRAIN WO CONTRAST  Result Date:  10/22/2019 CLINICAL DATA:  Acute neuro deficit.  Rule out stroke. EXAM: MRI HEAD WITHOUT CONTRAST TECHNIQUE: Multiplanar, multiecho pulse sequences of the brain and surrounding structures were obtained without intravenous contrast. COMPARISON:  CT head 04/30/2018 FINDINGS: Brain: Negative for acute infarct. Extensive chronic microvascular ischemic change in the white matter. Mild chronic ischemic change in the pons. 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  Numerous areas of chronic microhemorrhage including the right basal ganglia and right thalamus and left medial temporal lobe. Chronic microhemorrhage in the right paramedian pons. Ventricle size normal.  Normal cerebral volume.  No mass lesion. Vascular: Normal arterial flow voids Skull and upper cervical spine: No focal skeletal lesion. Sinuses/Orbits: Paranasal sinuses clear.  Negative orbit Other: None IMPRESSION: Negative for acute infarct Extensive chronic microvascular ischemic change. Multiple areas of chronic microhemorrhage in the brain likely due to the patient's known hypertension. Electronically Signed   By: Franchot Gallo M.D.   On: 10/22/2019 10:54    Procedures Procedures (including critical care time)  Medications Ordered in ED Medications - No data to display  ED Course  I have reviewed the triage vital signs and the nursing notes.  Pertinent labs & imaging results that were available during my care of the patient were reviewed by me and considered in my medical decision making (see chart for details).     Elderly female with recent lupus diagnosis, history of hypertension presents with new numbness, chest pain. Chest pain is resolved prior to my evaluation but given consideration of atypical ACS versus stroke versus lupus cerebritis, labs, x-ray, MRI, ordered, reviewed.  MDM Rules/Calculators/A&P                          11:16 AM Patient in no distress, awake,  alert. I reviewed the x-ray, MRI, and labs, interpreted myself, agree with interpretation, and now have discussed them with the patient. Patient's findings reassuring, no evidence for atypical ACS, no evidence for acute stroke, no ongoing hemodynamic instability. Suspicion for the patient's chronic hypertension contributing to her presentation, but given the otherwise reassuring findings, as above she is appropriate for discharge with close outpatient follow-up. Final Clinical Impression(s) / ED Diagnoses Final diagnoses:  Numbness  Atypical chest pain     Carmin Muskrat, MD 10/22/19 1128

## 2019-10-22 NOTE — ED Notes (Signed)
Discharged from ED in NAD, no questions at this time. Ambulatory out of ED

## 2019-10-22 NOTE — Discharge Instructions (Addendum)
As discussed, your evaluation today has been largely reassuring.  But, it is important that you monitor your condition carefully, and do not hesitate to return to the ED if you develop new, or concerning changes in your condition. ? ?Otherwise, please follow-up with your physician for appropriate ongoing care. ? ?

## 2019-11-07 ENCOUNTER — Encounter: Payer: Self-pay | Admitting: Internal Medicine

## 2019-11-07 ENCOUNTER — Telehealth (INDEPENDENT_AMBULATORY_CARE_PROVIDER_SITE_OTHER): Payer: Self-pay | Admitting: Internal Medicine

## 2019-11-07 DIAGNOSIS — E782 Mixed hyperlipidemia: Secondary | ICD-10-CM

## 2019-11-07 DIAGNOSIS — I10 Essential (primary) hypertension: Secondary | ICD-10-CM

## 2019-11-07 DIAGNOSIS — R200 Anesthesia of skin: Secondary | ICD-10-CM

## 2019-11-07 DIAGNOSIS — R9089 Other abnormal findings on diagnostic imaging of central nervous system: Secondary | ICD-10-CM

## 2019-11-07 NOTE — Progress Notes (Signed)
Virtual Visit via Telephone Note  I connected with Katherine Henson, on 11/07/2019 at 2:45 PM by telephone due to the COVID-19 pandemic and verified that I am speaking with the correct person using two identifiers.   Consent: I discussed the limitations, risks, security and privacy concerns of performing an evaluation and management service by telephone and the availability of in person appointments. I also discussed with the patient that there may be a patient responsible charge related to this service. The patient expressed understanding and agreed to proceed.   Location of Patient: Home   Location of Provider: Clinic    Persons participating in Telemedicine visit: Katherine Henson [facility] Dr. Juleen China      History of Present Illness: Patient has a visit to f/u on chronic medical conditions.   Chronic HTN Disease Monitoring:  Home BP Monitoring - 130/70s  Chest pain- no  Dyspnea- no Headache - no  Medications: Amlodipine 10 mg, Metoprolol 12.5 mg BID  Compliance- yes Lightheadedness- no  Edema- no    HYPERLIPIDEMIA  Symptoms Chest pain on exertion:  no    Leg claudication:   no  Medication Monitoring Compliance- Yes with Simvastatin 20 mg   Right upper quadrant pain- no   Muscle aches- no  The 10-year ASCVD risk score Katherine Bussing DC Jr., et al., 2013) is: 7.5%   Values used to calculate the score:     Age: 59 years     Sex: Female     Is Non-Hispanic African American: Yes     Diabetic: No     Tobacco smoker: No     Systolic Blood Pressure: 607 mmHg     Is BP treated: Yes     HDL Cholesterol: 63 mg/dL     Total Cholesterol: 171 mg/dL    Past Medical History:  Diagnosis Date  . Allergy   . Chronic hepatitis C ([place]) 12/06/2018  . Depression   . HTN (hypertension)   . Hyperlipidemia    Allergies  Allergen Reactions  . Penicillins Anaphylaxis    Did it involve swelling of the face/tongue/throat, SOB, or low BP? No Did it involve  sudden or severe rash/hives, skin peeling, or any reaction on the inside of your mouth or nose? Yes Did you need to seek medical attention at a hospital or doctor's office? No When did it last happen?30 years ago If all above answers are "NO", may proceed with cephalosporin use.    Current Outpatient Medications on File Prior to Visit  Medication Sig Dispense Refill  . amLODipine (NORVASC) 10 MG tablet Take 1 tablet (10 mg total) by mouth daily. 30 tablet 2  . metoprolol tartrate (LOPRESSOR) 25 MG tablet Take 0.5 tablets (12.5 mg total) by mouth 2 (two) times daily. 60 tablet 2  . simvastatin (ZOCOR) 20 MG tablet Take 1 tablet (20 mg total) by mouth daily at 6 PM. 30 tablet 2   No current facility-administered medications on file prior to visit.    Observations/Objective: NAD. Speaking clearly.  Work of breathing normal.  Alert and oriented. Mood appropriate.   Assessment and Plan: 1. Essential hypertension BP seems fairly well controlled from patient report and was with decent control at previous office visit and even at ER. Would not adjust medications for now.   2. Mixed hyperlipidemia Continue Simvastatin. Last LDL was at goal and ASCVD risk is 7.5%. Will need repeat cholesterol monitoring at next office visit.   3. Abnormal brain MRI 4. Left sided numbness Patient  presented to ED on 8/17 for left sided numbness and chest pain. ACS was ruled out. Patient reports numbness/tingling has persisted since ER visit. At that time, MRI was obtained that was negative for acute infarct but did show chronic microvascular ischemic changes and multiple areas of chronic micro-hemorrhage likely related to HTN. Would ask neurology to evaluate due to this MRI finding and patient's symptomatology.  - Ambulatory referral to Neurology  Follow Up Instructions: Neuro follow up; follow up with PCP in 2 months    I discussed the assessment and treatment plan with the patient. The patient was  provided an opportunity to ask questions and all were answered. The patient agreed with the plan and demonstrated an understanding of the instructions.   The patient was advised to call back or seek an in-person evaluation if the symptoms worsen or if the condition fails to improve as anticipated.     I provided 28 minutes total of non-face-to-face time during this encounter including median intraservice time, reviewing previous notes, investigations, ordering medications, medical decision making, coordinating care and patient verbalized understanding at the end of the visit.    Phill Myron, D.O. Primary Care at [facility]  11/07/2019, 2:45 PM

## 2019-11-08 ENCOUNTER — Encounter: Payer: Self-pay | Admitting: Neurology

## 2019-11-13 ENCOUNTER — Emergency Department (HOSPITAL_COMMUNITY)
Admission: EM | Admit: 2019-11-13 | Discharge: 2019-11-14 | Disposition: A | Discharge status: Home / Self Care | Payer: Medicaid Other | Attending: Emergency Medicine | Admitting: Emergency Medicine

## 2019-11-13 ENCOUNTER — Emergency Department (HOSPITAL_COMMUNITY): Payer: Medicaid Other

## 2019-11-13 ENCOUNTER — Other Ambulatory Visit: Payer: Self-pay

## 2019-11-13 DIAGNOSIS — Z87891 Personal history of nicotine dependence: Secondary | ICD-10-CM | POA: Insufficient documentation

## 2019-11-13 DIAGNOSIS — R519 Headache, unspecified: Secondary | ICD-10-CM | POA: Insufficient documentation

## 2019-11-13 DIAGNOSIS — Z79899 Other long term (current) drug therapy: Secondary | ICD-10-CM | POA: Insufficient documentation

## 2019-11-13 DIAGNOSIS — H539 Unspecified visual disturbance: Secondary | ICD-10-CM | POA: Insufficient documentation

## 2019-11-13 DIAGNOSIS — I10 Essential (primary) hypertension: Secondary | ICD-10-CM | POA: Insufficient documentation

## 2019-11-13 DIAGNOSIS — R42 Dizziness and giddiness: Secondary | ICD-10-CM | POA: Insufficient documentation

## 2019-11-13 LAB — CBC
HCT: 39.4 % (ref 36.0–46.0)
Hemoglobin: 12.2 g/dL (ref 12.0–15.0)
MCH: 27.4 pg (ref 26.0–34.0)
MCHC: 31 g/dL (ref 30.0–36.0)
MCV: 88.3 fL (ref 80.0–100.0)
Platelets: 291 10*3/uL (ref 150–400)
RBC: 4.46 MIL/uL (ref 3.87–5.11)
RDW: 15.4 % (ref 11.5–15.5)
WBC: 8.3 10*3/uL (ref 4.0–10.5)
nRBC: 0 % (ref 0.0–0.2)

## 2019-11-13 LAB — BASIC METABOLIC PANEL
Anion gap: 9 (ref 5–15)
BUN: 13 mg/dL (ref 6–20)
CO2: 26 mmol/L (ref 22–32)
Calcium: 10 mg/dL (ref 8.9–10.3)
Chloride: 108 mmol/L (ref 98–111)
Creatinine, Ser: 1.05 mg/dL — ABNORMAL HIGH (ref 0.44–1.00)
GFR calc Af Amer: >60 mL/min (ref >60)
GFR calc non Af Amer: 58 mL/min — ABNORMAL LOW (ref >60)
Glucose, Bld: 86 mg/dL (ref 70–99)
Potassium: 3.9 mmol/L (ref 3.5–5.1)
Sodium: 143 mmol/L (ref 135–145)

## 2019-11-13 LAB — URINALYSIS, ROUTINE W REFLEX MICROSCOPIC
Bilirubin Urine: NEGATIVE
Glucose, UA: NEGATIVE mg/dL
Hgb urine dipstick: NEGATIVE
Ketones, ur: NEGATIVE mg/dL
Nitrite: NEGATIVE
Protein, ur: NEGATIVE mg/dL
Specific Gravity, Urine: 1.014 (ref 1.005–1.030)
pH: 5 (ref 5.0–8.0)

## 2019-11-13 NOTE — ED Triage Notes (Signed)
Pt sts she awoke this morning with a knot on the right side of her head. Every time she stands up she becomes dizzy and loses her balance. Denies falling or other injury. Endorses seeing spots out of her right eye and difficulty opening her R eye.

## 2019-11-14 ENCOUNTER — Emergency Department (HOSPITAL_COMMUNITY): Payer: Medicaid Other

## 2019-11-14 MED ORDER — ASPIRIN 81 MG PO CHEW: 81.0000 mg | CHEWABLE_TABLET | Freq: Every day | ORAL | 0 refills | Status: DC

## 2019-11-14 NOTE — Discharge Instructions (Signed)
Please read and follow all provided instructions.  Your diagnoses today include:  1. Dizziness     Tests performed today include:  Blood counts and electrolytes  CT and MRI of the brain - does not show a stroke, but does show chronic changes related to long-term high blood pressure  Vital signs. See below for your results today.   Medications prescribed:   Aspirin - please take 81mg  ASA daily until told otherwise by your doctor or neurologist  Take any prescribed medications only as directed.  Home care instructions:  Follow any educational materials contained in this packet.  BE VERY CAREFUL not to take multiple medicines containing Tylenol (also called acetaminophen). Doing so can lead to an overdose which can damage your liver and cause liver failure and possibly death.   Follow-up instructions: Please follow-up with your primary care provider or neurologist for further evaluation of your symptoms.   Return instructions:   Please return to the Emergency Department if you experience worsening symptoms.   Return if you have weakness in your arms or legs, slurred speech, trouble walking or talking, confusion, or trouble with your balance.   Please return if you have any other emergent concerns.  Additional Information:  Your vital signs today were: BP 123/76 (BP Location: Right Arm)   Pulse 62   Temp 98.5 F (36.9 C) (Oral)   Resp 16   Ht 5' (1.524 m)   Wt 68 kg   LMP 09/27/2012   SpO2 100%   BMI 29.29 kg/m  If your blood pressure (BP) was elevated above 135/85 this visit, please have this repeated by your doctor within one month. --------------

## 2019-11-14 NOTE — ED Provider Notes (Signed)
[facility] EMERGENCY DEPARTMENT Provider Note   CSN: [id number] Arrival date & time: 11/13/19  1614     History Chief Complaint  Patient presents with   Dizziness    Katherine Henson is a 59 y.o. female.  Patient with history of hypertension, hyperlipidemia, congenital strabismus -- presents the emergency department today for dizziness.  Patient has several complaints.  She states that about a week ago she developed dizziness and difficulty walking.  She describes this as "swaying" and feeling like her body wants to go in a direction she does not need for her to go.  She denies any falls.  She was able to ambulate, but slowly.  She states that sometimes symptoms are worse feeling like "a wave comes over me".  Symptoms seem to be worse with movement and standing.  Starting at the same time, she developed "spots" in her right eye.  She denies full loss of vision or curtain sensation in her vision.  No neck pain.  It is only in the right eye and she does not have double vision.  Patient does report various chronic tingling sensations in her arms or her legs.  She has mentioned this to her primary care doctor and the symptoms seem to be unchanged.  About 2 days ago she developed a sensation of a "knot" on the right side of her scalp.  She states that this area feels tight.  She denies injuries to the area.  No reported chest pain or shortness of breath.  No previous history of heart attack or stroke.  No distinct weakness in the arms or the legs.  No chest pain, shortness of breath, nausea, vomiting, diarrhea.  No treatments prior to arrival.        Past Medical History:  Diagnosis Date   Allergy    Chronic hepatitis C ([place]) 12/06/2018   Depression    HTN (hypertension)    Hyperlipidemia     Patient Active Problem List   Diagnosis Date Noted   Positive double stranded DNA antibody test 04/10/2019   Positive ANA (antinuclear antibody) 04/04/2019   Chronic  hepatitis C without hepatic coma ([place]) 12/06/2018   HTN (hypertension) 04/30/2018   Scotoma 04/30/2018   Dyspnea 04/30/2018   Hypertensive urgency 04/30/2018    Past Surgical History:  Procedure Laterality Date   NO PAST SURGERIES       OB History   No obstetric history on file.     Family History  Problem Relation Age of Onset   Stroke Mother    Anuerysm Mother        brain   Diabetes Sister    Breast cancer Sister    Diabetes Brother    Breast cancer Sister    Breast cancer Niece    Prostate cancer Brother     Social History   Tobacco Use   Smoking status: Former Smoker    Packs/day: 0.50    Years: 30.00    Pack years: 15.00    Quit date: 12/06/2010    Years since quitting: 8.9   Smokeless tobacco: Never Used  Substance Use Topics   Alcohol use: Yes    Alcohol/week: 1.0 standard drink    Types: 1 Glasses of wine per week    Comment: occasional   Drug use: Not Currently    Types: Marijuana    Home Medications Prior to Admission medications   Medication Sig Start Date End Date Taking? Authorizing Provider  amLODipine (NORVASC) 10  MG tablet Take 1 tablet (10 mg total) by mouth daily. 08/28/19   Nicolette Bang, DO  metoprolol tartrate (LOPRESSOR) 25 MG tablet Take 0.5 tablets (12.5 mg total) by mouth 2 (two) times daily. 08/28/19   Nicolette Bang, DO  simvastatin (ZOCOR) 20 MG tablet Take 1 tablet (20 mg total) by mouth daily at 6 PM. 08/28/19   Nicolette Bang, DO    Allergies    Penicillins  Review of Systems   Review of Systems  Constitutional: Negative for fever.  HENT: Negative for congestion, dental problem, rhinorrhea and sinus pressure.   Eyes: Positive for visual disturbance. Negative for photophobia, discharge and redness.  Respiratory: Negative for shortness of breath.   Cardiovascular: Negative for chest pain.  Gastrointestinal: Negative for nausea and vomiting.  Musculoskeletal: Negative for  gait problem, neck pain and neck stiffness.  Skin: Negative for rash.  Neurological: Positive for dizziness and headaches. Negative for syncope, facial asymmetry, speech difficulty, weakness, light-headedness and numbness.  Psychiatric/Behavioral: Negative for confusion.    Physical Exam Updated Vital Signs BP 123/76 (BP Location: Right Arm)    Pulse 62    Temp 98.5 F (36.9 C) (Oral)    Resp 16    Ht 5' (1.524 m)    Wt 68 kg    LMP 09/27/2012    SpO2 100%    BMI 29.29 kg/m   Physical Exam Vitals and nursing note reviewed.  Constitutional:      Appearance: She is well-developed.  HENT:     Head: Normocephalic and atraumatic.     Right Ear: Tympanic membrane, ear canal and external ear normal.     Left Ear: Tympanic membrane, ear canal and external ear normal.     Ears:     Comments: TMs appear normal without bulging or erythema.    Nose: Nose normal.     Mouth/Throat:     Pharynx: Uvula midline.  Eyes:     General: Lids are normal.     Extraocular Movements:     Right eye: No nystagmus.     Left eye: No nystagmus.     Conjunctiva/sclera: Conjunctivae normal.     Pupils: Pupils are equal, round, and reactive to light.     Comments: Strabismus noted which patient confirms is chronic.  No nystagmus.  Neck:     Comments: No carotid bruit. Cardiovascular:     Rate and Rhythm: Normal rate and regular rhythm.     Comments: No murmur.  Rhythm is regular. Pulmonary:     Effort: Pulmonary effort is normal.     Breath sounds: Normal breath sounds.  Abdominal:     Palpations: Abdomen is soft.     Tenderness: There is no abdominal tenderness.  Musculoskeletal:     Cervical back: Normal range of motion and neck supple. No tenderness or bony tenderness.  Skin:    General: Skin is warm and dry.  Neurological:     Mental Status: She is alert and oriented to person, place, and time.     GCS: GCS eye subscore is 4. GCS verbal subscore is 5. GCS motor subscore is 6.     Cranial Nerves:  No cranial nerve deficit.     Sensory: No sensory deficit.     Coordination: Coordination normal.     Gait: Gait normal.     Deep Tendon Reflexes: Reflexes are normal and symmetric.     Comments: Patient is able to stand from a lying position and  walk across the room.  Psychiatric:        Mood and Affect: Mood normal.     ED Results / Procedures / Treatments   Labs (all labs ordered are listed, but only abnormal results are displayed) Labs Reviewed  BASIC METABOLIC PANEL - Abnormal; Notable for the following components:      Result Value   Creatinine, Ser 1.05 (*)    GFR calc non Af Amer 58 (*)    All other components within normal limits  URINALYSIS, ROUTINE W REFLEX MICROSCOPIC - Abnormal; Notable for the following components:   Leukocytes,Ua TRACE (*)    Bacteria, UA RARE (*)    All other components within normal limits  CBC  CBG MONITORING, ED    EKG EKG Interpretation  Date/Time:  Wednesday November 13 2019 16:49:58 EDT Ventricular Rate:  60 PR Interval:  156 QRS Duration: 72 QT Interval:  396 QTC Calculation: 396 R Axis:   13 Text Interpretation: Normal sinus rhythm with sinus arrhythmia Possible Left atrial enlargement Borderline ECG since last tracing no significant change Confirmed by Daleen Bo [phone]) on 11/13/2019 9:07:16 PM   Radiology CT HEAD WO CONTRAST  Result Date: 11/13/2019 CLINICAL DATA:  Dizziness EXAM: CT HEAD WITHOUT CONTRAST TECHNIQUE: Contiguous axial images were obtained from the base of the skull through the vertex without intravenous contrast. COMPARISON:  MRI 10/22/2019, CT brain 04/30/2018 FINDINGS: Brain: No acute territorial infarction, hemorrhage, or intracranial mass. Chronic lacunar infarcts within the thalamus. Advanced chronic small vessel ischemic change of the white matter. Stable ventricle size. Vascular: No hyperdense vessels.  Carotid vascular calcification Skull: Normal. Negative for fracture or focal lesion. Sinuses/Orbits: No  acute finding. Other: None IMPRESSION: 1. No CT evidence for acute intracranial abnormality. 2. Atrophy and chronic small vessel ischemic changes of the white matter. Electronically Signed   By: Donavan Foil M.D.   On: 11/13/2019 19:32    Procedures Procedures (including critical care time)  Medications Ordered in ED Medications - No data to display  ED Course  I have reviewed the triage vital signs and the nursing notes.  Pertinent labs & imaging results that were available during my care of the patient were reviewed by me and considered in my medical decision making (see chart for details).  Patient seen and examined. Work-up reviewed to this point.  Given risk factor profile and symptoms, patient will need MRI to evaluate for acute or subacute ischemic stroke, rule out posterior circulation stroke.  Vital signs reviewed and are as follows: BP 123/76 (BP Location: Right Arm)    Pulse 62    Temp 98.5 F (36.9 C) (Oral)    Resp 16    Ht 5' (1.524 m)    Wt 68 kg    LMP 09/27/2012    SpO2 100%    BMI 29.29 kg/m    Clinical Course as of Nov 13 916  Thu Nov 14, 5327  5546 59 year old female here with over a week of intermittent dizziness which sounds somewhat lightheaded but she also says she can be unbalanced.  She said she is also feeling a little more clumsy with her grips.  Work-up so far has been fairly unrevealing.  She is pending an MRI.  Disposition per results of testing.   [MB]    Clinical Course User Index [MB] Hayden Rasmussen, MD   9:19 AM MRI negative for acute findings today. Pt updated and comfortable with plan for home.   Plan: start 81mg  asa daily  until advised by PCP/neurologist.  Patient states that she has an appointment with primary care and neurologist referral but not until November.  I have placed ambulatory referral to neurology for patient.   Patient counseled to return if they have weakness in their arms or legs, slurred speech, trouble walking or talking,  confusion, trouble with their balance, or if they have any other concerns. Patient verbalizes understanding and agrees with plan.    MDM Rules/Calculators/A&P                          Patient with dizziness, vision disturbance, imbalance. Worsening in past week. MRI performed today -- unchanged from 8/21.  Patient is ambulatory without significant focal abnormalities on neuro exam.  Comfortable discharged home with follow-up plan as above.  Strict return instructions as above.   Final Clinical Impression(s) / ED Diagnoses Final diagnoses:  Dizziness    Rx / DC Orders ED Discharge Orders         Ordered    aspirin 81 MG chewable tablet  Daily        11/14/19 0915    Ambulatory referral to Neurology       Comments: An appointment is requested in approximately: 1 week for balance problems, chronic changes on MRI   11/14/19 0916           Carlisle Cater, PA-C 11/14/19 [id number]    Hayden Rasmussen, MD 11/14/19 [phone]

## 2019-11-14 NOTE — ED Notes (Signed)
Patient verbalizes understanding of discharge instructions. Opportunity for questioning and answers were provided. Armband removed by staff, pt discharged from ED.  

## 2019-11-14 NOTE — ED Notes (Signed)
Patient transported to MRI 

## 2019-11-20 ENCOUNTER — Ambulatory Visit: Payer: Self-pay | Attending: Family Medicine

## 2019-11-20 ENCOUNTER — Other Ambulatory Visit: Payer: Self-pay

## 2019-11-21 ENCOUNTER — Other Ambulatory Visit: Payer: Self-pay | Admitting: Family Medicine

## 2019-11-25 ENCOUNTER — Other Ambulatory Visit: Payer: Self-pay

## 2019-11-25 DIAGNOSIS — I10 Essential (primary) hypertension: Secondary | ICD-10-CM

## 2019-11-25 MED ORDER — AMLODIPINE BESYLATE 10 MG PO TABS: 10.0000 mg | ORAL_TABLET | Freq: Every day | ORAL | 0 refills | Status: DC

## 2019-11-26 ENCOUNTER — Other Ambulatory Visit: Payer: Self-pay

## 2019-11-26 ENCOUNTER — Ambulatory Visit (INDEPENDENT_AMBULATORY_CARE_PROVIDER_SITE_OTHER): Payer: Self-pay | Admitting: Critical Care Medicine

## 2019-11-26 VITALS — BP 146/91 | HR 64 | Temp 97.5°F | Resp 17 | Wt 148.0 lb

## 2019-11-26 DIAGNOSIS — E782 Mixed hyperlipidemia: Secondary | ICD-10-CM

## 2019-11-26 DIAGNOSIS — R42 Dizziness and giddiness: Secondary | ICD-10-CM

## 2019-11-26 DIAGNOSIS — Z23 Encounter for immunization: Secondary | ICD-10-CM

## 2019-11-26 DIAGNOSIS — Z8673 Personal history of transient ischemic attack (TIA), and cerebral infarction without residual deficits: Secondary | ICD-10-CM

## 2019-11-26 DIAGNOSIS — I10 Essential (primary) hypertension: Secondary | ICD-10-CM

## 2019-11-26 MED ORDER — METOPROLOL TARTRATE 25 MG PO TABS: 12.5000 mg | ORAL_TABLET | Freq: Two times a day (BID) | ORAL | 1 refills | Status: DC

## 2019-11-26 MED ORDER — SIMVASTATIN 40 MG PO TABS: 40.0000 mg | ORAL_TABLET | Freq: Every day | ORAL | 1 refills | Status: DC

## 2019-11-26 MED ORDER — LOSARTAN POTASSIUM-HCTZ 50-12.5 MG PO TABS
1.0000 | ORAL_TABLET | Freq: Every day | ORAL | 3 refills | Status: DC
Start: 2019-11-26 — End: 2020-01-27

## 2019-11-26 NOTE — Assessment & Plan Note (Signed)
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  Patient is on 81 mg of aspirin we will increase simvastatin to 40 mg daily and we have been able to achieve neurology visit within the next week

## 2019-11-26 NOTE — Assessment & Plan Note (Signed)
Hypertension still poorly controlled plan will be to begin losartan HCT 50/12.51 daily continue amlodipine and metoprolol as prescribed and obtain lipid panel and liver function panel will continue simvastatin as well

## 2019-11-26 NOTE — Patient Instructions (Addendum)
Start losartan HCT one daily Increase simvastatin 40mg  daily Stay on amlodipine and metoprolol as prescribed Stay on aspirin Labs today : lipid panel, liver panel Flu vaccine was given Refills on all medications sent Follow up Dr Juleen China in two months  Keep scheduled neurology appointment this week on Thursday  Follow healthy diet as below   Managing Your Hypertension Hypertension is commonly called high blood pressure. This is when the force of your blood pressing against the walls of your arteries is too strong. Arteries are blood vessels that carry blood from your heart throughout your body. Hypertension forces the heart to work harder to pump blood, and may cause the arteries to become narrow or stiff. Having untreated or uncontrolled hypertension can cause heart attack, stroke, kidney disease, and other problems. What are blood pressure readings? A blood pressure reading consists of a higher number over a lower number. Ideally, your blood pressure should be below 120/80. The first ("top") number is called the systolic pressure. It is a measure of the pressure in your arteries as your heart beats. The second ("bottom") number is called the diastolic pressure. It is a measure of the pressure in your arteries as the heart relaxes. What does my blood pressure reading mean? Blood pressure is classified into four stages. Based on your blood pressure reading, your health care provider may use the following stages to determine what type of treatment you need, if any. Systolic pressure and diastolic pressure are measured in a unit called mm Hg. Normal  Systolic pressure: below 992.  Diastolic pressure: below 80. Elevated  Systolic pressure: 426-834.  Diastolic pressure: below 80. Hypertension stage 1  Systolic pressure: 196-222.  Diastolic pressure: 97-98. Hypertension stage 2  Systolic pressure: 921 or above.  Diastolic pressure: 90 or above. What health risks are associated with  hypertension? Managing your hypertension is an important responsibility. Uncontrolled hypertension can lead to:  A heart attack.  A stroke.  A weakened blood vessel (aneurysm).  Heart failure.  Kidney damage.  Eye damage.  Metabolic syndrome.  Memory and concentration problems. What changes can I make to manage my hypertension? Hypertension can be managed by making lifestyle changes and possibly by taking medicines. Your health care provider will help you make a plan to bring your blood pressure within a normal range. Eating and drinking   Eat a diet that is high in fiber and potassium, and low in salt (sodium), added sugar, and fat. An example eating plan is called the DASH (Dietary Approaches to Stop Hypertension) diet. To eat this way: ? Eat plenty of fresh fruits and vegetables. Try to fill half of your plate at each meal with fruits and vegetables. ? Eat whole grains, such as whole wheat pasta, brown rice, or whole grain bread. Fill about one quarter of your plate with whole grains. ? Eat low-fat diary products. ? Avoid fatty cuts of meat, processed or cured meats, and poultry with skin. Fill about one quarter of your plate with lean proteins such as fish, chicken without skin, beans, eggs, and tofu. ? Avoid premade and processed foods. These tend to be higher in sodium, added sugar, and fat.  Reduce your daily sodium intake. Most people with hypertension should eat less than 1,500 mg of sodium a day.  Limit alcohol intake to no more than 1 drink a day for nonpregnant women and 2 drinks a day for men. One drink equals 12 oz of beer, 5 oz of wine, or 1 oz of hard  liquor. Lifestyle  Work with your health care provider to maintain a healthy body weight, or to lose weight. Ask what an ideal weight is for you.  Get at least 30 minutes of exercise that causes your heart to beat faster (aerobic exercise) most days of the week. Activities may include walking, swimming, or  biking.  Include exercise to strengthen your muscles (resistance exercise), such as weight lifting, as part of your weekly exercise routine. Try to do these types of exercises for 30 minutes at least 3 days a week.  Do not use any products that contain nicotine or tobacco, such as cigarettes and e-cigarettes. If you need help quitting, ask your health care provider.  Control any long-term (chronic) conditions you have, such as high cholesterol or diabetes. Monitoring  Monitor your blood pressure at home as told by your health care provider. Your personal target blood pressure may vary depending on your medical conditions, your age, and other factors.  Have your blood pressure checked regularly, as often as told by your health care provider. Working with your health care provider  Review all the medicines you take with your health care provider because there may be side effects or interactions.  Talk with your health care provider about your diet, exercise habits, and other lifestyle factors that may be contributing to hypertension.  Visit your health care provider regularly. Your health care provider can help you create and adjust your plan for managing hypertension. Will I need medicine to control my blood pressure? Your health care provider may prescribe medicine if lifestyle changes are not enough to get your blood pressure under control, and if:  Your systolic blood pressure is 130 or higher.  Your diastolic blood pressure is 80 or higher. Take medicines only as told by your health care provider. Follow the directions carefully. Blood pressure medicines must be taken as prescribed. The medicine does not work as well when you skip doses. Skipping doses also puts you at risk for problems. Contact a health care provider if:  You think you are having a reaction to medicines you have taken.  You have repeated (recurrent) headaches.  You feel dizzy.  You have swelling in your  ankles.  You have trouble with your vision. Get help right away if:  You develop a severe headache or confusion.  You have unusual weakness or numbness, or you feel faint.  You have severe pain in your chest or abdomen.  You vomit repeatedly.  You have trouble breathing. Summary  Hypertension is when the force of blood pumping through your arteries is too strong. If this condition is not controlled, it may put you at risk for serious complications.  Your personal target blood pressure may vary depending on your medical conditions, your age, and other factors. For most people, a normal blood pressure is less than 120/80.  Hypertension is managed by lifestyle changes, medicines, or both. Lifestyle changes include weight loss, eating a healthy, low-sodium diet, exercising more, and limiting alcohol. This information is not intended to replace advice given to you by your health care provider. Make sure you discuss any questions you have with your health care provider. Document Revised: 06/15/2018 Document Reviewed: 01/20/2016 Elsevier Patient Education  [employer].

## 2019-11-26 NOTE — Progress Notes (Signed)
Subjective:    Patient ID: Katherine Henson, female    DOB: September 15, 1960, 59 y.o.   MRN: [id number]  This is a 59 year old female previous history of significant dizziness of 2 months in onset.  She states when she stands up she will feel the room spinning and feels like she is going to lose her balance.  She is actually not fallen.  The dizziness is unchanged.  She was evaluated initially with a telemedicine visit and an MRI had been done previously in the emergency room in August and seen by her primary care provider on 2 September.  The MRI at that point showed chronic vascular changes microscopic microvascular and then also areas of chronic infarct in the cerebellar distribution.  Patient was then seen again in the emergency room on 8 September with continued symptoms another MRI was done and was not largely different from the prior MRI no acute stroke seen.  Neurology consultation was pending and was told she would not be seen until November.  Patient comes in today wishing to be seen sooner by neurology and here for primary care follow-up face-to-face.  Note the September 2 visit with primary care was a phone visit  Note the patient is on amlodipine 10 mg daily metoprolol 12 and half milligrams twice daily on arrival blood pressure was 146/91    Past Medical History:  Diagnosis Date  . Allergy   . Chronic hepatitis C ([place]) 12/06/2018  . Depression   . HTN (hypertension)   . Hyperlipidemia      Family History  Problem Relation Age of Onset  . Stroke Mother   . Anuerysm Mother        brain  . Diabetes Sister   . Breast cancer Sister   . Diabetes Brother   . Breast cancer Sister   . Breast cancer Niece   . Prostate cancer Brother      Social History   Socioeconomic History  . Marital status: Single    Spouse name: Not on file  . Number of children: 5  . Years of education: Not on file  . Highest education level: Not on file  Occupational History  . Occupation: housekeeper   Tobacco Use  . Smoking status: Former Smoker    Packs/day: 0.50    Years: 30.00    Pack years: 15.00    Quit date: 12/06/2010    Years since quitting: 8.9  . Smokeless tobacco: Never Used  Substance and Sexual Activity  . Alcohol use: Yes    Alcohol/week: 1.0 standard drink    Types: 1 Glasses of wine per week    Comment: occasional  . Drug use: Not Currently    Types: Marijuana  . Sexual activity: Not Currently  Other Topics Concern  . Not on file  Social History Narrative  . Not on file   Social Determinants of Health   Financial Resource Strain:   . Difficulty of Paying Living Expenses: Not on file  Food Insecurity:   . Worried About Charity fundraiser in the Last Year: Not on file  . Ran Out of Food in the Last Year: Not on file  Transportation Needs:   . Lack of Transportation (Medical): Not on file  . Lack of Transportation (Non-Medical): Not on file  Physical Activity:   . Days of Exercise per Week: Not on file  . Minutes of Exercise per Session: Not on file  Stress:   . Feeling of Stress : Not  on file  Social Connections:   . Frequency of Communication with Friends and Family: Not on file  . Frequency of Social Gatherings with Friends and Family: Not on file  . Attends Religious Services: Not on file  . Active Member of Clubs or Organizations: Not on file  . Attends Archivist Meetings: Not on file  . Marital Status: Not on file  Intimate Partner Violence:   . Fear of Current or Ex-Partner: Not on file  . Emotionally Abused: Not on file  . Physically Abused: Not on file  . Sexually Abused: Not on file     Allergies  Allergen Reactions  . Penicillins Anaphylaxis    Did it involve swelling of the face/tongue/throat, SOB, or low BP? No Did it involve sudden or severe rash/hives, skin peeling, or any reaction on the inside of your mouth or nose? Yes Did you need to seek medical attention at a hospital or doctor's office? No When did it last  happen?30 years ago If all above answers are "NO", may proceed with cephalosporin use.     Outpatient Medications Prior to Visit  Medication Sig Dispense Refill  . amLODipine (NORVASC) 10 MG tablet Take 1 tablet (10 mg total) by mouth daily. 90 tablet 0  . aspirin 81 MG chewable tablet Chew 1 tablet (81 mg total) by mouth daily. 30 tablet 0  . metoprolol tartrate (LOPRESSOR) 25 MG tablet Take 0.5 tablets (12.5 mg total) by mouth 2 (two) times daily. 60 tablet 2  . simvastatin (ZOCOR) 20 MG tablet Take 1 tablet (20 mg total) by mouth daily at 6 PM. 30 tablet 2   No facility-administered medications prior to visit.     Review of Systems  Constitutional: Positive for fatigue.  HENT: Negative for hearing loss, nosebleeds, postnasal drip, rhinorrhea, sinus pressure and sinus pain.   Eyes: Positive for visual disturbance.  Respiratory: Positive for cough and shortness of breath. Negative for wheezing.   Cardiovascular: Positive for leg swelling.       Leg pain worse with walking  Gastrointestinal: Negative for abdominal distention, abdominal pain, diarrhea, nausea, rectal pain and vomiting.  Genitourinary: Negative.   Musculoskeletal: Negative.   Skin: Negative.   Neurological: Positive for dizziness, facial asymmetry, weakness and light-headedness. Negative for tremors, seizures, syncope, speech difficulty, numbness and headaches.       R FACIAL WEAKNESS  Hematological: Bruises/bleeds easily.  Psychiatric/Behavioral: Negative.        Objective:   Physical Exam Vitals:   11/26/19 1336  BP: (!) 146/91  Pulse: 64  Resp: 17  Temp: (!) 97.5 F (36.4 C)  TempSrc: Temporal  SpO2: 98%  Weight: 148 lb (67.1 kg)    Gen: Pleasant, well-nourished, in no distress,  normal affect  ENT: No lesions,  mouth clear,  oropharynx clear, no postnasal drip  Neck: No JVD, no TMG, no carotid bruits  Lungs: No use of accessory muscles, no dullness to percussion, clear without rales or  rhonchi  Cardiovascular: RRR, heart sounds normal, no murmur or gallops, no peripheral edema  Abdomen: soft and NT, no HSM,  BS normal  Musculoskeletal: No deformities, no cyanosis or clubbing  Neuro: alert, non focal, neurologic exam is normal except there is past-pointing of the finger to finger-to-nose test on the right  Skin: Warm, no lesions or rashes  MRI studies were reviewed in the [facility] link system        Assessment & Plan:  I personally reviewed all images and  lab data in the [facility] system as well as any outside material available during this office visit and agree with the  radiology impressions.   HTN (hypertension) Hypertension still poorly controlled plan will be to begin losartan HCT 50/12.51 daily continue amlodipine and metoprolol as prescribed and obtain lipid panel and liver function panel will continue simvastatin as well  History of cerebellar stroke There is a subclinical cerebellar stroke in distribution of the MRI with microvascular changes elsewhere seen patient does have balance issues which would be secondary to cerebellar infarct.  Patient is on 81 mg of aspirin we will increase simvastatin to 40 mg daily and we have been able to achieve neurology visit within the next week   Krisi was seen today for follow-up.  Diagnoses and all orders for this visit:  Essential hypertension -     metoprolol tartrate (LOPRESSOR) 25 MG tablet; Take 0.5 tablets (12.5 mg total) by mouth 2 (two) times daily. -     Lipid Panel -     Hepatic function panel  Dizziness  Needs flu shot -     Flu Vaccine QUAD 6+ mos PF IM (Fluarix Quad PF)  Mixed hyperlipidemia -     simvastatin (ZOCOR) 40 MG tablet; Take 1 tablet (40 mg total) by mouth daily at 6 PM. -     Lipid Panel  History of cerebellar stroke  Other orders -     losartan-hydrochlorothiazide (HYZAAR) 50-12.5 MG tablet; Take 1 tablet by mouth daily.

## 2019-11-27 LAB — HEPATIC FUNCTION PANEL
ALT: 14 IU/L (ref 0–32)
AST: 19 IU/L (ref 0–40)
Albumin: 4.6 g/dL (ref 3.8–4.9)
Alkaline Phosphatase: 111 IU/L (ref 44–121)
Bilirubin Total: 0.2 mg/dL (ref 0.0–1.2)
Bilirubin, Direct: <0.1 mg/dL (ref 0.00–0.40)
Total Protein: 8 g/dL (ref 6.0–8.5)

## 2019-11-27 LAB — LIPID PANEL
Chol/HDL Ratio: 3.6 ratio (ref 0.0–4.4)
Cholesterol, Total: 194 mg/dL (ref 100–199)
HDL: 54 mg/dL (ref >39)
LDL Chol Calc (NIH): 124 mg/dL — ABNORMAL HIGH (ref 0–99)
Triglycerides: 87 mg/dL (ref 0–149)
VLDL Cholesterol Cal: 16 mg/dL (ref 5–40)

## 2019-11-27 NOTE — Progress Notes (Signed)
NEUROLOGY CONSULTATION NOTE  Katherine Henson MRN: [id number] DOB: 1960-12-20  Referring provider: Phill Myron, DO Primary care provider: Phill Myron, DO  Reason for consult:  Numbness, abnormal brain MRI  HISTORY OF PRESENT ILLNESS: Katherine Henson is a 59 year old left-handed female with HTN and HLD who presents for numbness and abnormal brain MRI.  History supplemented by primary care and ED notes.  She was at work, when she suddenly became dizzy, like a wave over her and had to stop.  5 minutes.  Not spinning.  She also notes 2 spots in right eye off and on.  No double vision.  No nausea.  Tingling   On 10/20/2019, she woke up with chest pain radiating into her left arm.  It subsequently resolved but the next day the chest pain returned with associated left arm numbness which then spread to include the left arm and leg.  She went to the ED for evaluation.  Tests were negative for ACS.  MRI of brain personally reviewed and showed extensive chronic small vessel ischemic changes in the cerebral white matter and pons, as well as chronic infarct in the basal ganglia and thalami and left cerebellum as well as chronic microhemorrhages in a distribution likely secondary to hypertension.  She was discharged in stable condition.  However, left upper and lower extremity numbness and tingling persisted.  In early September, she was standing at work when she started feeling dizzy with difficulty walking.  Dizziness is not a spinning sensation but rather a feeling of a wave rushing over her and feels like her body is swaying.  Sensation fluctuates in intensity.  No double vision, nausea or vomiting.  She also endorsed seeing spots in her right eye.  She went to the ED on 11/13/2019 for further evaluation.  MRI of brain personally reviewed was repeated and was stable compared to prior imaging, with no new or acute findings.  She followed up with primary care on Tuesday who has continued her ASA  81mg  but increased simvastatin to 40mg  daily.  LDL was 124.  She continues to feel dizzy.  No falls.  She reports a mild non-throbbing headache but nothing frequent or severe.  She still notes numbness and tingling in the left arm.    PAST MEDICAL HISTORY: Past Medical History:  Diagnosis Date  . Allergy   . Chronic hepatitis C ([place]) 12/06/2018  . Depression   . HTN (hypertension)   . Hyperlipidemia     PAST SURGICAL HISTORY: Past Surgical History:  Procedure Laterality Date  . NO PAST SURGERIES      MEDICATIONS: Current Outpatient Medications on File Prior to Visit  Medication Sig Dispense Refill  . amLODipine (NORVASC) 10 MG tablet Take 1 tablet (10 mg total) by mouth daily. 90 tablet 0  . aspirin 81 MG chewable tablet Chew 1 tablet (81 mg total) by mouth daily. 30 tablet 0  . losartan-hydrochlorothiazide (HYZAAR) 50-12.5 MG tablet Take 1 tablet by mouth daily. 90 tablet 3  . metoprolol tartrate (LOPRESSOR) 25 MG tablet Take 0.5 tablets (12.5 mg total) by mouth 2 (two) times daily. 90 tablet 1  . simvastatin (ZOCOR) 40 MG tablet Take 1 tablet (40 mg total) by mouth daily at 6 PM. 90 tablet 1   No current facility-administered medications on file prior to visit.    ALLERGIES: Allergies  Allergen Reactions  . Penicillins Anaphylaxis    Did it involve swelling of the face/tongue/throat, SOB, or low BP? No Did  it involve sudden or severe rash/hives, skin peeling, or any reaction on the inside of your mouth or nose? Yes Did you need to seek medical attention at a hospital or doctor's office? No When did it last happen?30 years ago If all above answers are "NO", may proceed with cephalosporin use.    FAMILY HISTORY: Family History  Problem Relation Age of Onset  . Stroke Mother   . Anuerysm Mother        brain  . Diabetes Sister   . Breast cancer Sister   . Diabetes Brother   . Breast cancer Sister   . Breast cancer Niece   . Prostate cancer Brother    SOCIAL  HISTORY: Social History   Socioeconomic History  . Marital status: Single    Spouse name: Not on file  . Number of children: 5  . Years of education: Not on file  . Highest education level: Not on file  Occupational History  . Occupation: housekeeper  Tobacco Use  . Smoking status: Former Smoker    Packs/day: 0.50    Years: 30.00    Pack years: 15.00    Quit date: 12/06/2010    Years since quitting: 8.9  . Smokeless tobacco: Never Used  Substance and Sexual Activity  . Alcohol use: Yes    Alcohol/week: 1.0 standard drink    Types: 1 Glasses of wine per week    Comment: occasional  . Drug use: Not Currently    Types: Marijuana  . Sexual activity: Not Currently  Other Topics Concern  . Not on file  Social History Narrative  . Not on file   Social Determinants of Health   Financial Resource Strain:   . Difficulty of Paying Living Expenses: Not on file  Food Insecurity:   . Worried About Charity fundraiser in the Last Year: Not on file  . Ran Out of Food in the Last Year: Not on file  Transportation Needs:   . Lack of Transportation (Medical): Not on file  . Lack of Transportation (Non-Medical): Not on file  Physical Activity:   . Days of Exercise per Week: Not on file  . Minutes of Exercise per Session: Not on file  Stress:   . Feeling of Stress : Not on file  Social Connections:   . Frequency of Communication with Friends and Family: Not on file  . Frequency of Social Gatherings with Friends and Family: Not on file  . Attends Religious Services: Not on file  . Active Member of Clubs or Organizations: Not on file  . Attends Archivist Meetings: Not on file  . Marital Status: Not on file  Intimate Partner Violence:   . Fear of Current or Ex-Partner: Not on file  . Emotionally Abused: Not on file  . Physically Abused: Not on file  . Sexually Abused: Not on file    PHYSICAL EXAM: Blood pressure 112/69, pulse 67, resp. rate 20, height 5' (1.524 m),  weight 150 lb (68 kg), last menstrual period 09/27/2012, SpO2 100 %. General: No acute distress.  Patient appears well-groomed.   Head:  Normocephalic/atraumatic Eyes:  fundi examined but not visualized Neck: supple, no paraspinal tenderness, full range of motion Back: No paraspinal tenderness Heart: regular rate and rhythm Lungs: Clear to auscultation bilaterally. Vascular: No carotid bruits. Neurological Exam: Mental status: alert and oriented to person, place, and time, recent and remote memory intact, fund of knowledge intact, attention and concentration intact, speech fluent and not dysarthric,  language intact. Cranial nerves: CN I: not tested CN II: pupils equal, round and reactive to light, visual fields intact CN III, IV, VI:  Right amblyopia/adduction of right eye on primary gaze, no nystagmus, no ptosis CN V: facial sensation intact CN VII: upper and lower face symmetric CN VIII: hearing intact CN IX, X: gag intact, uvula midline CN XI: sternocleidomastoid and trapezius muscles intact CN XII: tongue midline Bulk & Tone: normal, no fasciculations. Motor:  5/5 throughout  Sensation:  Pinprick and vibration sensation intact. Deep Tendon Reflexes:  2+ throughout, toes downgoing.   Finger to nose testing:  Without dysmetria.  Heel to shin:  Without dysmetria.   Gait:  Mild staggering gait.  Able to turn but unable to tandem walk. Romberg negative  IMPRESSION: 1.  Dizziness and disequilibrium.  Evidence of significant cerebrovascular disease on MRI with remote cerebellar infarcts.  Would rule out posterior circulation insufficiency. 2.  Cerebrovascular disease 3.  Hypertension 4.  Hyperlipidemia 5.  Left arm numbness and tingling.  Possible carpal tunnel syndrome. 6.  Vision loss/disturbance in right eye    PLAN: 1.  CTA of head and neck 2.  Refer to ophthalmology 3.  Continue medical management of stroke risk factors:  ASA 81mg , statin therapy, antihypertensive  medications. 4.  After testing, consider vestibular rehab 5.  Follow up after testing.  Thank you for allowing me to take part in the care of this patient.  Metta Clines, DO  CC:  Phill Myron, DO

## 2019-11-28 ENCOUNTER — Encounter: Payer: Self-pay | Admitting: Neurology

## 2019-11-28 ENCOUNTER — Ambulatory Visit (INDEPENDENT_AMBULATORY_CARE_PROVIDER_SITE_OTHER): Payer: Self-pay | Admitting: Neurology

## 2019-11-28 ENCOUNTER — Other Ambulatory Visit: Payer: Self-pay

## 2019-11-28 ENCOUNTER — Telehealth: Payer: Self-pay | Admitting: Neurology

## 2019-11-28 ENCOUNTER — Other Ambulatory Visit: Payer: Self-pay | Admitting: Internal Medicine

## 2019-11-28 VITALS — BP 112/69 | HR 67 | Resp 20 | Ht 60.0 in | Wt 150.0 lb

## 2019-11-28 DIAGNOSIS — R202 Paresthesia of skin: Secondary | ICD-10-CM

## 2019-11-28 DIAGNOSIS — I679 Cerebrovascular disease, unspecified: Secondary | ICD-10-CM

## 2019-11-28 DIAGNOSIS — H5461 Unqualified visual loss, right eye, normal vision left eye: Secondary | ICD-10-CM

## 2019-11-28 DIAGNOSIS — I10 Essential (primary) hypertension: Secondary | ICD-10-CM

## 2019-11-28 DIAGNOSIS — Z1231 Encounter for screening mammogram for malignant neoplasm of breast: Secondary | ICD-10-CM

## 2019-11-28 DIAGNOSIS — E782 Mixed hyperlipidemia: Secondary | ICD-10-CM

## 2019-11-28 DIAGNOSIS — R42 Dizziness and giddiness: Secondary | ICD-10-CM

## 2019-11-28 DIAGNOSIS — R200 Anesthesia of skin: Secondary | ICD-10-CM

## 2019-11-28 NOTE — Telephone Encounter (Signed)
Patient called Katherine Henson to request a note to take her out of work due to the dizziness. Katherine Henson called our office to notify Dr Tomi Likens that the note would have to come from our office. Please call.

## 2019-11-28 NOTE — Patient Instructions (Signed)
1.  Will check CTA of head and neck 2.  Continue aspirin 81mg  daily 3.  Continue cholesterol and blood pressure medications 4.  Follow up after testing.  Further recommendations pending results.

## 2019-11-28 NOTE — Telephone Encounter (Signed)
No answer at 242

## 2019-11-29 NOTE — Telephone Encounter (Signed)
Per Dr. Tomi Likens, He and the patient did discuss that she will have to get note from the PCP

## 2019-11-29 NOTE — Telephone Encounter (Signed)
Patient called regarding note to excuse her from work

## 2019-12-05 ENCOUNTER — Ambulatory Visit: Payer: Medicaid Other

## 2019-12-05 ENCOUNTER — Telehealth: Payer: Self-pay

## 2019-12-05 NOTE — Telephone Encounter (Signed)
Pt has an appt with [facility] 11/18 at 11:30

## 2019-12-12 ENCOUNTER — Ambulatory Visit: Payer: Medicaid Other

## 2019-12-18 ENCOUNTER — Other Ambulatory Visit: Payer: Self-pay

## 2019-12-18 ENCOUNTER — Ambulatory Visit
Admission: RE | Admit: 2019-12-18 | Discharge: 2019-12-18 | Disposition: A | Discharge status: Home / Self Care | Payer: No Typology Code available for payment source | Source: Ambulatory Visit | Attending: Internal Medicine | Admitting: Internal Medicine

## 2019-12-18 DIAGNOSIS — Z1231 Encounter for screening mammogram for malignant neoplasm of breast: Secondary | ICD-10-CM

## 2019-12-19 ENCOUNTER — Ambulatory Visit: Payer: Medicaid Other

## 2019-12-23 ENCOUNTER — Other Ambulatory Visit: Payer: Self-pay

## 2019-12-23 ENCOUNTER — Ambulatory Visit (HOSPITAL_COMMUNITY)
Admission: RE | Admit: 2019-12-23 | Discharge: 2019-12-23 | Disposition: A | Discharge status: Home / Self Care | Payer: Self-pay | Source: Ambulatory Visit | Attending: Neurology | Admitting: Neurology

## 2019-12-23 DIAGNOSIS — I679 Cerebrovascular disease, unspecified: Secondary | ICD-10-CM | POA: Insufficient documentation

## 2019-12-23 DIAGNOSIS — R42 Dizziness and giddiness: Secondary | ICD-10-CM | POA: Insufficient documentation

## 2019-12-23 DIAGNOSIS — R200 Anesthesia of skin: Secondary | ICD-10-CM | POA: Insufficient documentation

## 2019-12-23 DIAGNOSIS — R202 Paresthesia of skin: Secondary | ICD-10-CM | POA: Insufficient documentation

## 2019-12-23 LAB — POCT I-STAT CREATININE: Creatinine, Ser: 0.9 mg/dL (ref 0.44–1.00)

## 2019-12-23 MED ORDER — IOHEXOL 350 MG/ML SOLN
100.0000 mL | Freq: Once | INTRAVENOUS | Status: AC | PRN
  Administered 2019-12-23: 80 mL via INTRAVENOUS

## 2019-12-25 ENCOUNTER — Ambulatory Visit: Payer: Medicaid Other

## 2019-12-26 NOTE — Progress Notes (Signed)
Patient notified of results & recommendations. Expressed understanding.

## 2020-01-06 ENCOUNTER — Ambulatory Visit: Payer: Self-pay | Attending: Internal Medicine

## 2020-01-06 ENCOUNTER — Ambulatory Visit: Payer: No Typology Code available for payment source | Admitting: Internal Medicine

## 2020-01-06 ENCOUNTER — Other Ambulatory Visit: Payer: Self-pay

## 2020-01-24 ENCOUNTER — Ambulatory Visit: Payer: No Typology Code available for payment source | Admitting: Neurology

## 2020-01-27 ENCOUNTER — Ambulatory Visit (INDEPENDENT_AMBULATORY_CARE_PROVIDER_SITE_OTHER): Payer: Self-pay | Admitting: Internal Medicine

## 2020-01-27 ENCOUNTER — Encounter: Payer: Self-pay | Admitting: Internal Medicine

## 2020-01-27 ENCOUNTER — Other Ambulatory Visit: Payer: Self-pay

## 2020-01-27 VITALS — BP 98/61 | HR 60 | Temp 97.2°F | Resp 17 | Wt 149.0 lb

## 2020-01-27 DIAGNOSIS — I10 Essential (primary) hypertension: Secondary | ICD-10-CM

## 2020-01-27 MED ORDER — LOSARTAN POTASSIUM 50 MG PO TABS: 50.0000 mg | ORAL_TABLET | Freq: Every day | ORAL | 1 refills | Status: DC

## 2020-01-27 NOTE — Progress Notes (Signed)
  Subjective:    Katherine Henson - 59 y.o. female MRN [id number]  Date of birth: 1960-04-12  HPI  Katherine Henson is here for follow up HTN. Losartan and HCTZ were started by Dr. Joya Gaskins on 9/21 due to BP above goal.   Chronic HTN Disease Monitoring:  Home BP Monitoring - 90-100s/60s  Chest pain- no  Dyspnea- no Headache - no  Medications: Amlodipine 10 mg, Losartan 50 mg, HCTZ 12.5 mg, metoprolol 12.5 mg BID  Compliance- yes Lightheadedness- yes  Edema- no     Health Maintenance:  There are no preventive care reminders to display for this patient.  -  reports that she quit smoking about 9 years ago. She has a 15.00 pack-year smoking history. She has never used smokeless tobacco. - Review of Systems: Per HPI. - Past Medical History: Patient Active Problem List   Diagnosis Date Noted  . History of cerebellar stroke 11/26/2019  . Mixed hyperlipidemia 11/26/2019  . Positive double stranded DNA antibody test 04/10/2019  . Chronic hepatitis C without hepatic coma ([place]) 12/06/2018  . HTN (hypertension) 04/30/2018   - Medications: reviewed and updated   Objective:   Physical Exam BP 98/61   Pulse 60   Temp (!) 97.2 F (36.2 C) (Temporal)   Resp 17   Wt 149 lb (67.6 kg)   LMP 09/27/2012   SpO2 98%   BMI 29.10 kg/m  Physical Exam Constitutional:      General: She is not in acute distress.    Appearance: She is not diaphoretic.  Cardiovascular:     Rate and Rhythm: Normal rate.  Pulmonary:     Effort: Pulmonary effort is normal. No respiratory distress.  Musculoskeletal:        General: Normal range of motion.  Skin:    General: Skin is warm and dry.  Neurological:     Mental Status: She is alert and oriented to person, place, and time.  Psychiatric:        Mood and Affect: Affect normal.        Judgment: Judgment normal.            Assessment & Plan:   1. Essential hypertension BP is on lower side and has been with home monitoring.  Patient reports some lightheadedness. This has been reported at prior office visit when BP was above goal and could certainly be related to subacute CVA; however, could be exacerbated by low BPs as well. Will d/c HCTZ and continue other medications of Amlodipine, Losartan, Metoprolol. Monitor BP at home and parameters/symptoms to alert office were given. Follow up in 6-8 weeks.  - losartan (COZAAR) 50 MG tablet; Take 1 tablet (50 mg total) by mouth daily.  Dispense: 90 tablet; Refill: [place], D.O. 01/27/2020, 9:20 AM Primary Care at [facility]

## 2020-01-31 IMAGING — CR DG CHEST 2V
2 series · 2 of 2 positions shown · non-contrast
Comparison: July 15, 2016

CLINICAL DATA: Dizziness.  Chest tightness.  Shortness of breath.

EXAM:
CHEST - 2 VIEW

[chest pa]
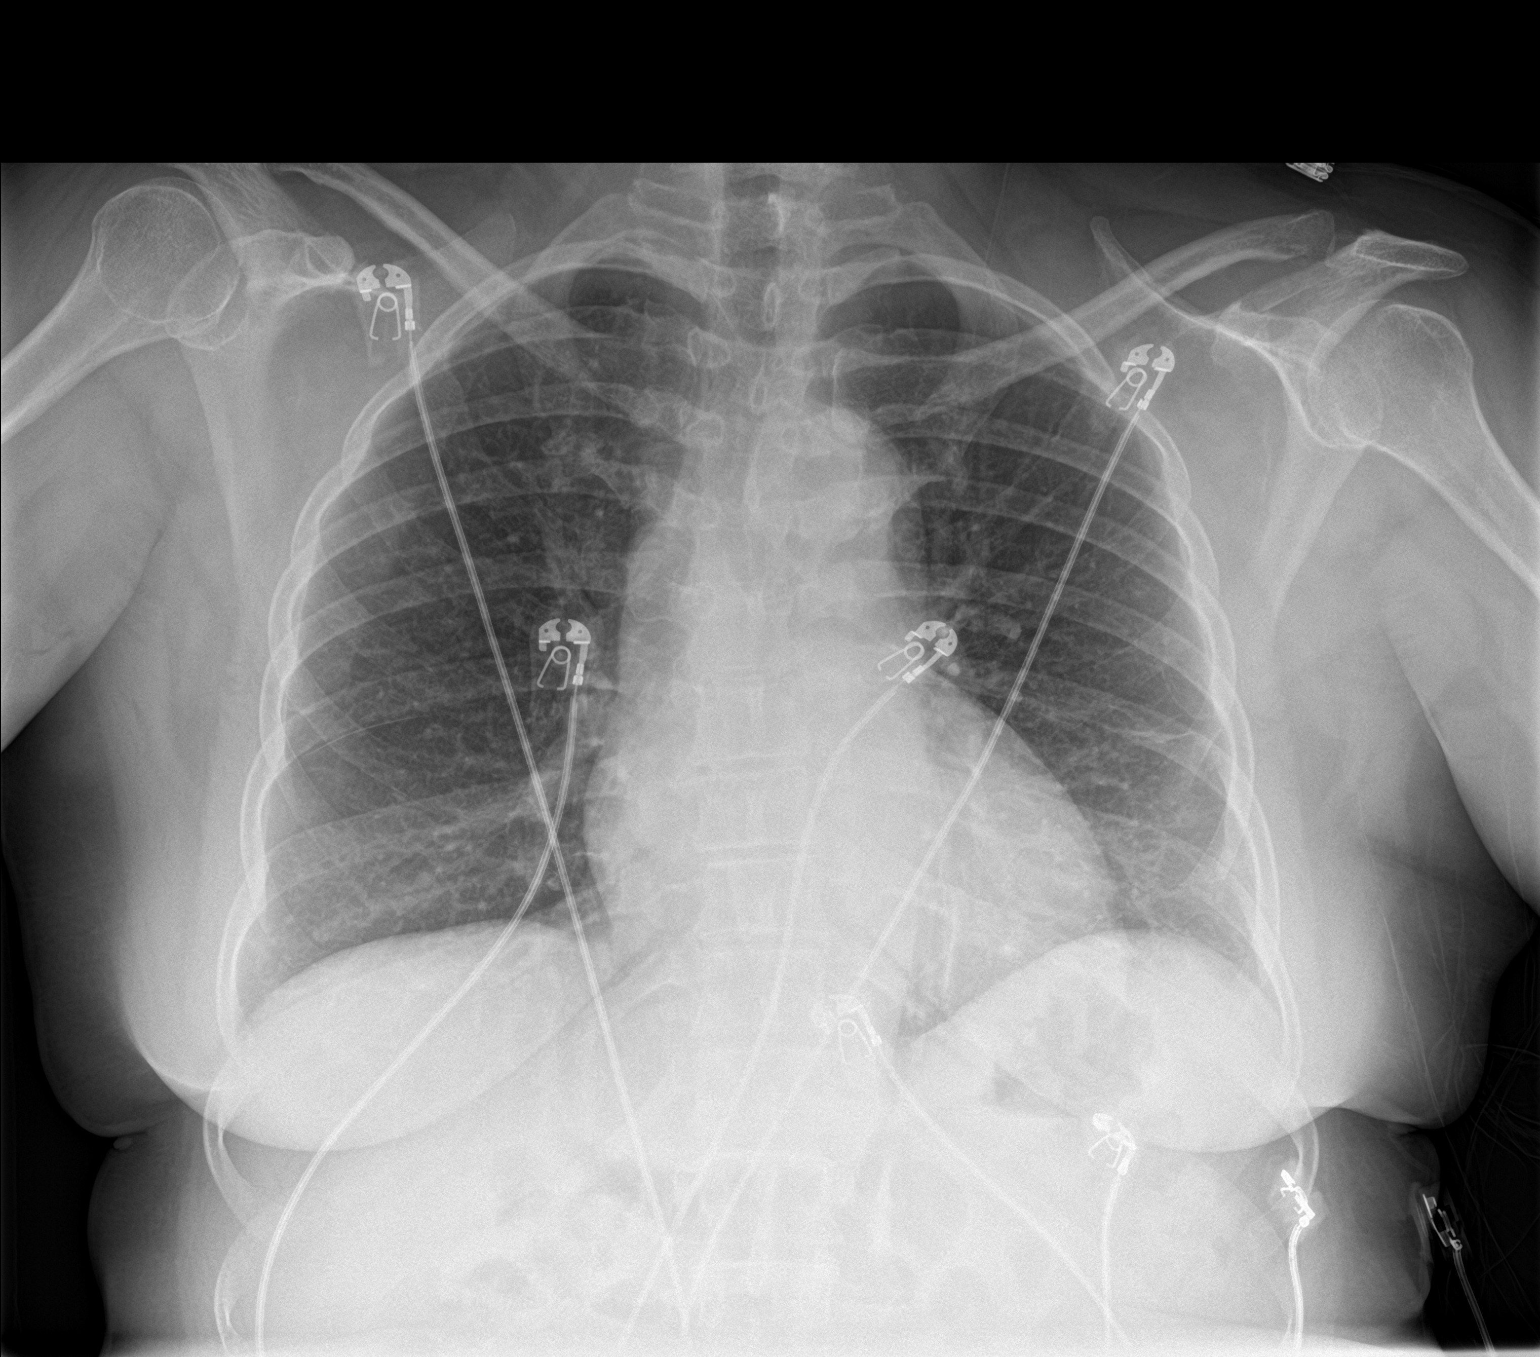

[chest lat]
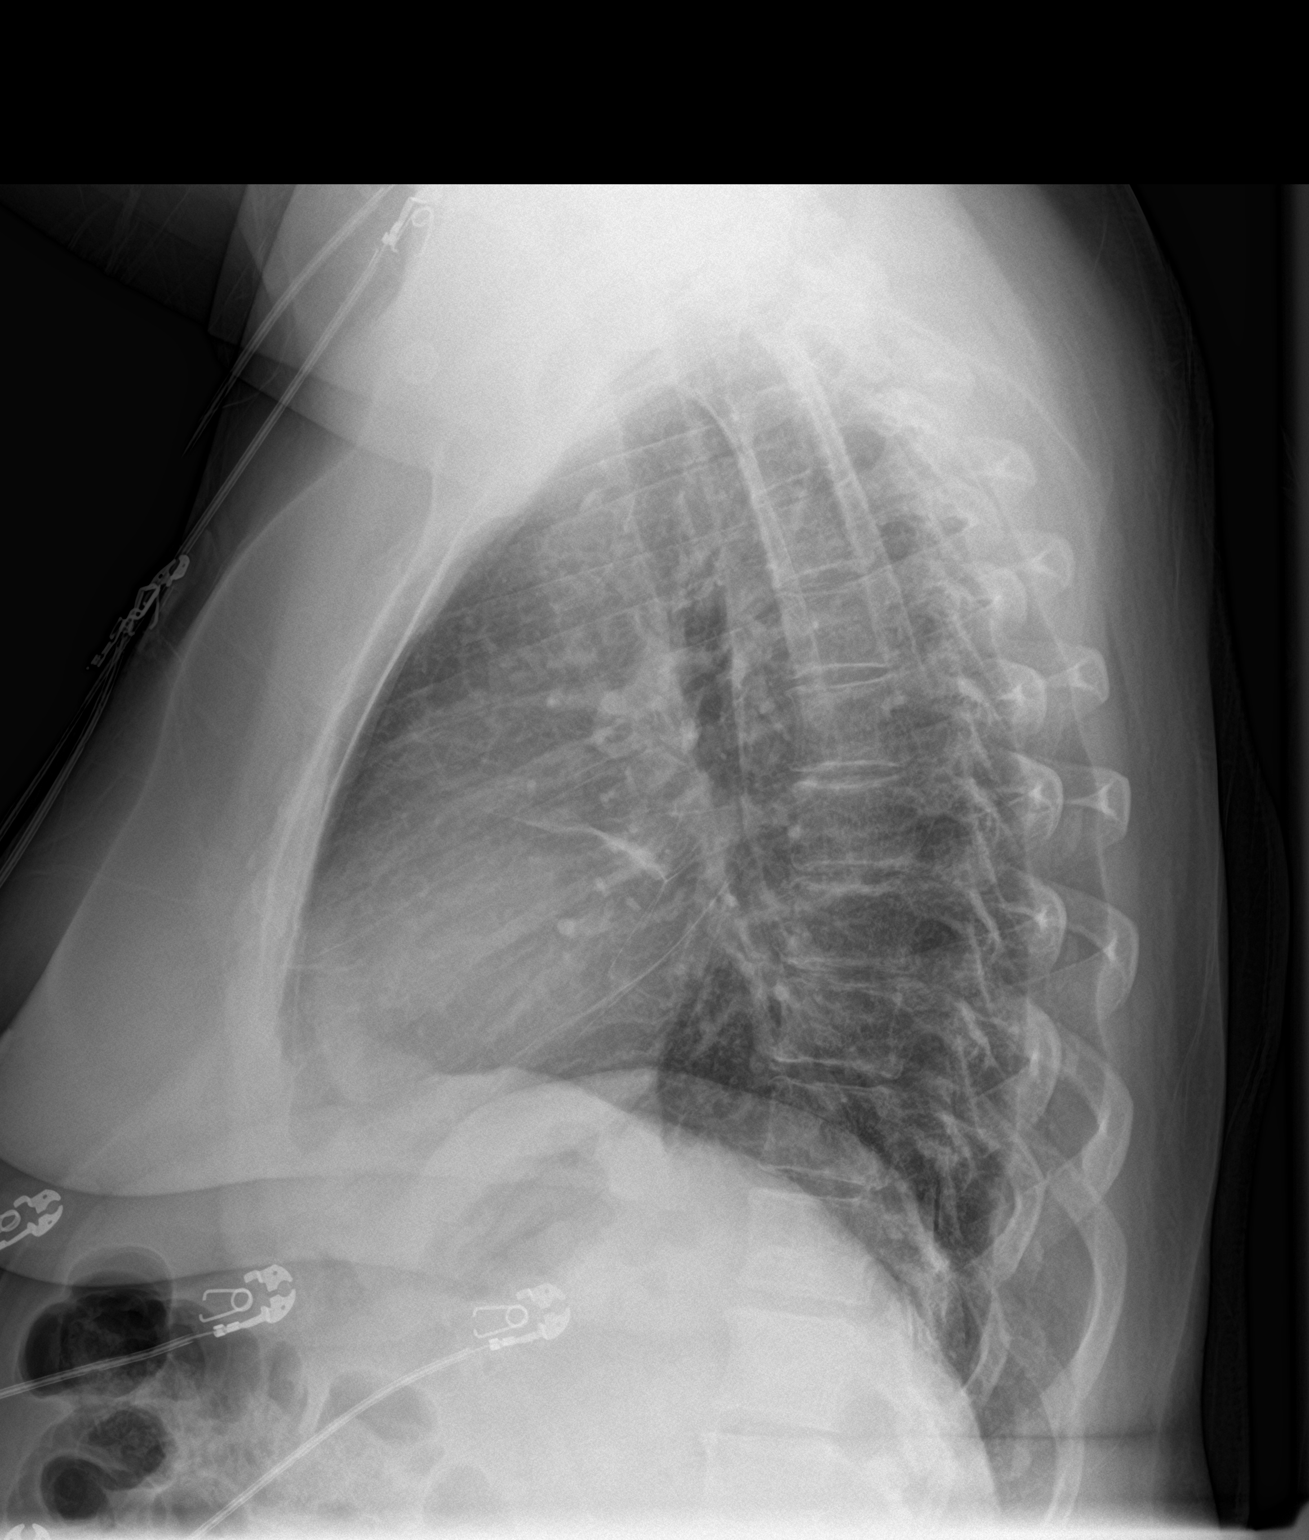

[2 of 2 positions shown; findings below may reference images not displayed]

FINDINGS: Mild opacity in left base is unchanged, likely scarring or chronic
atelectasis. The heart, hila, mediastinum, lungs, and pleura are
otherwise unremarkable.
IMPRESSION: Chronic mild opacity in left base, likely scar atelectasis. No other
acute abnormalities.

## 2020-01-31 IMAGING — CT CT HEAD W/O CM
4 series · 16 of 47 positions shown, 18 images · non-contrast
Comparison: None.

CLINICAL DATA: Dizziness, headache

EXAM:
CT HEAD WITHOUT CONTRAST
TECHNIQUE: Contiguous axial images were obtained from the base of the skull
through the vertex without intravenous contrast.

[Series 3: head without · axial · non-contrast · 0.41mm/px · z∈[-92,+23]mm · 7 of 31 slices shown, 9 images]
[im 4/31  brain]
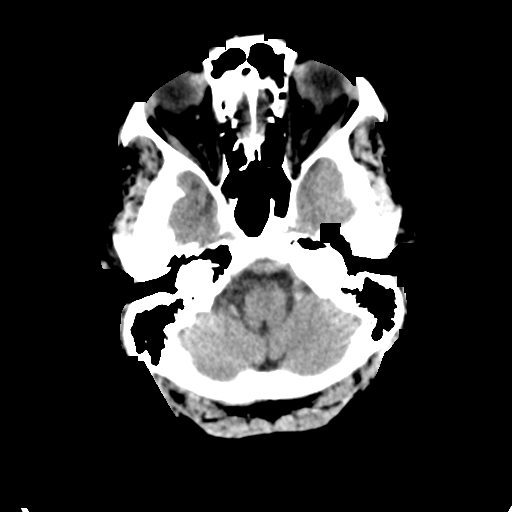
[im 4/31  bone]
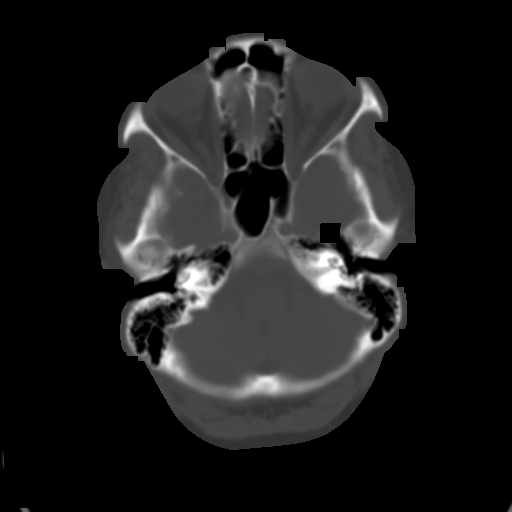
[im 8/31  brain]
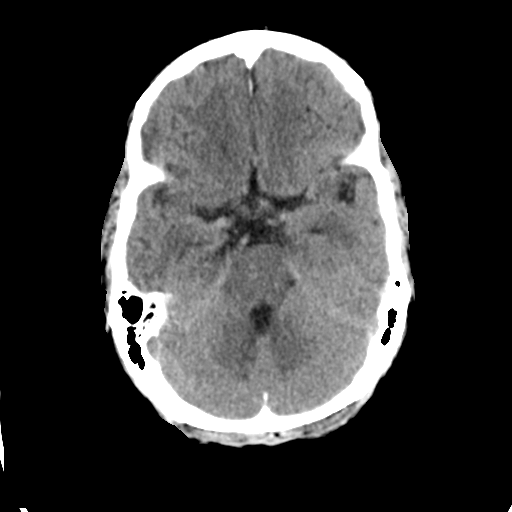
[im 12/31  brain]
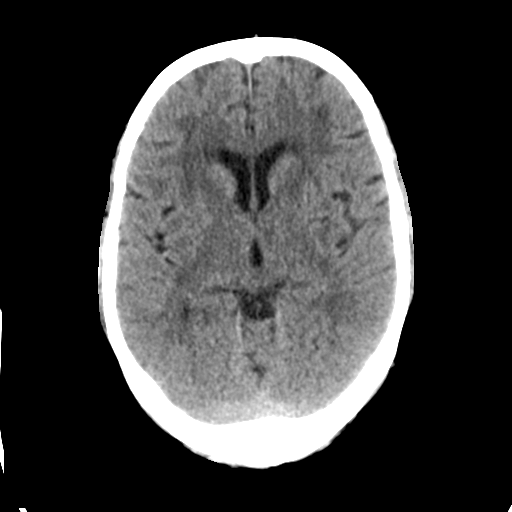
[im 16/31  brain]
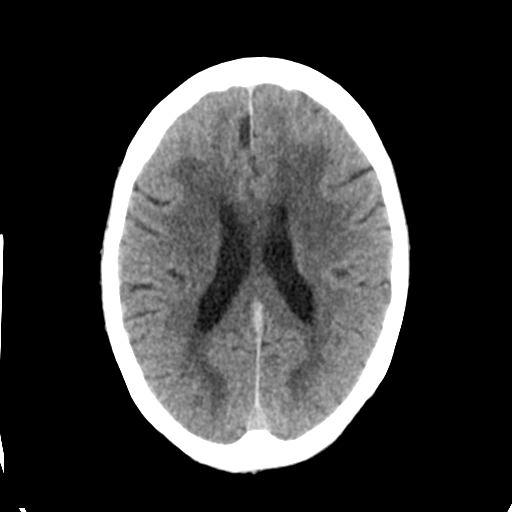
[im 19/31  brain]
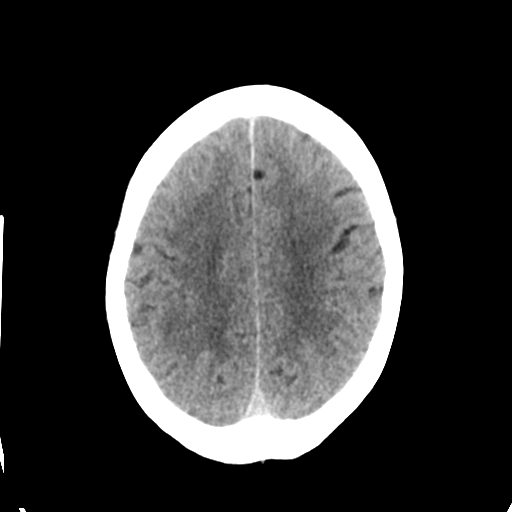
[im 19/31  bone]
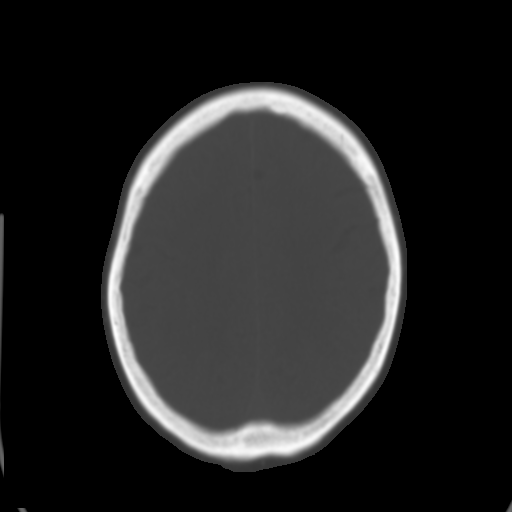
[im 23/31  brain]
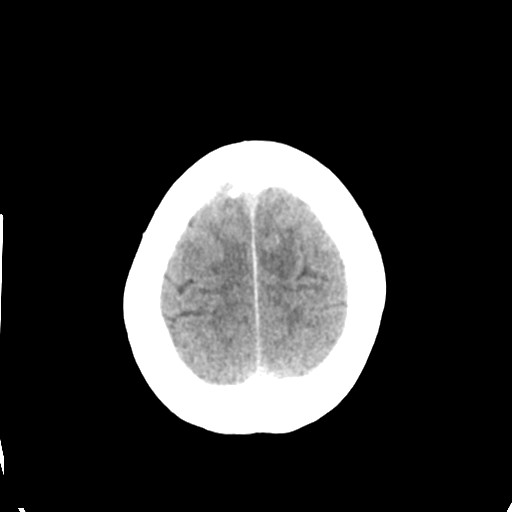
[im 27/31  brain]
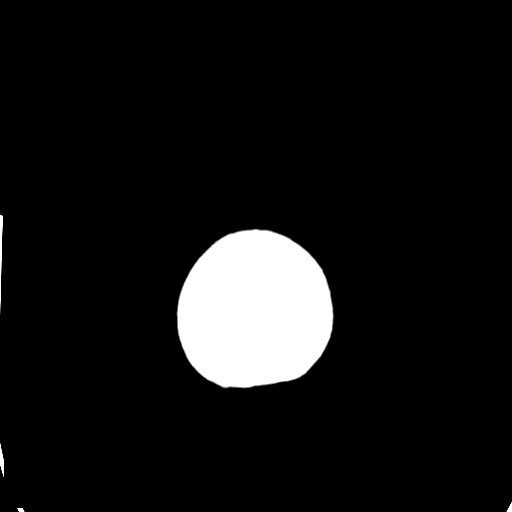

[Series 4: head bone · axial · 0.41mm/px · z∈[-93,-63]mm · 3 of 76 slices shown]
[im 8/76  bone]
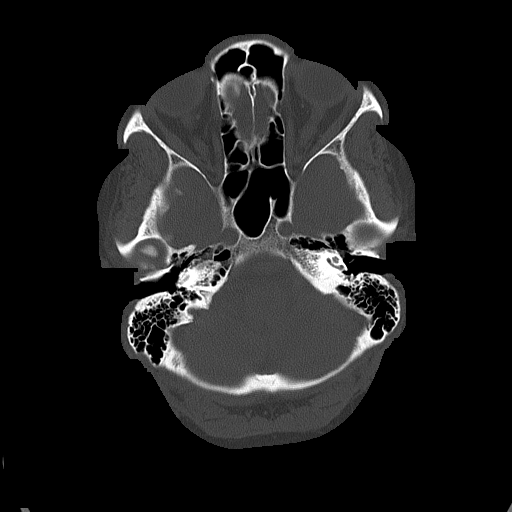
[im 16/76  bone]
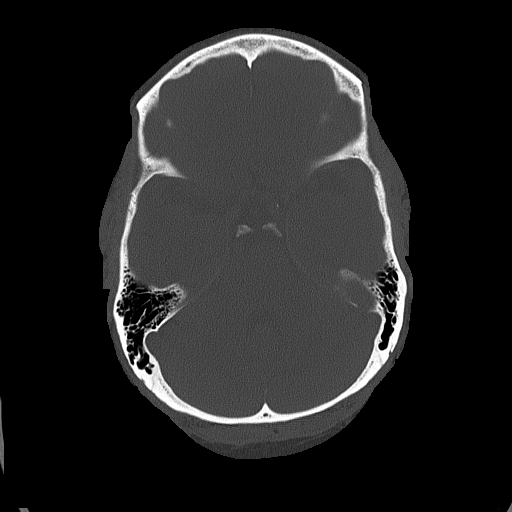
[im 23/76  bone]
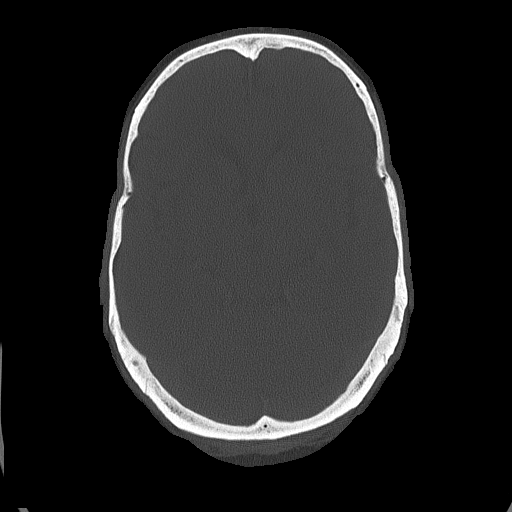

[Series 5: head without cor · coronal · non-contrast · 0.28mm/px · 3 of 67 slices shown]
[im 23/67  brain]
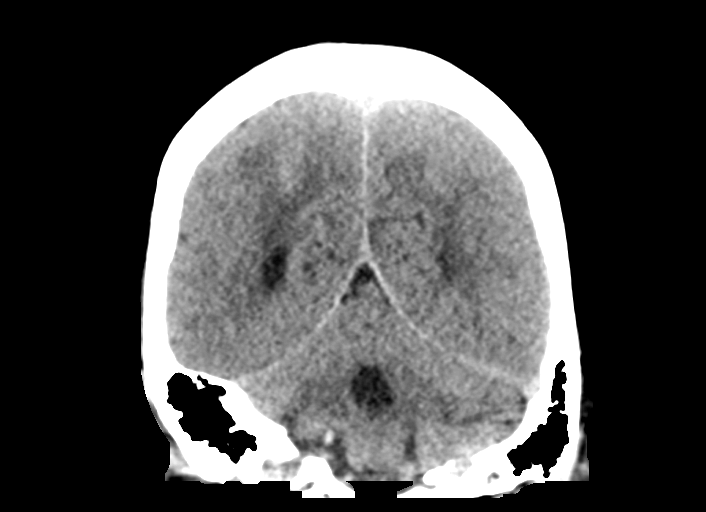
[im 30/67  brain]
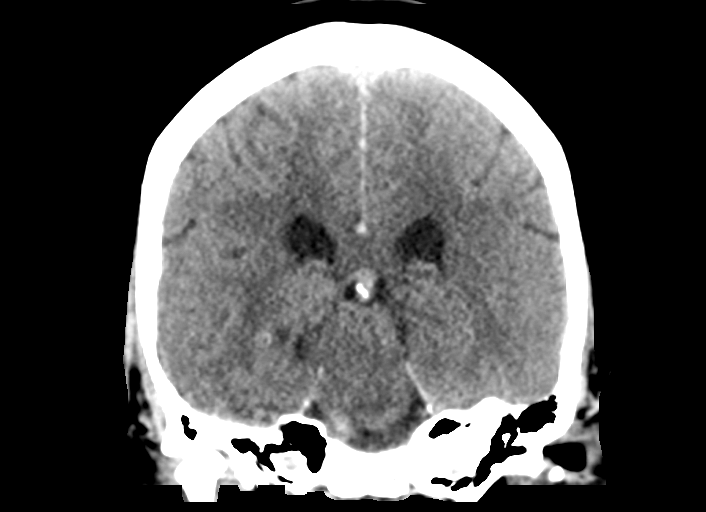
[im 37/67  brain]
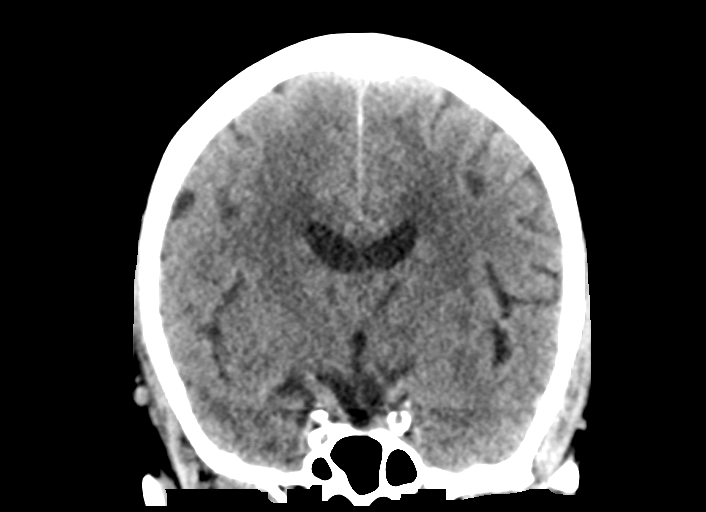

[Series 6: head without sag · sagittal · non-contrast · 0.29mm/px · 3 of 57 slices shown]
[im 19/57  brain]
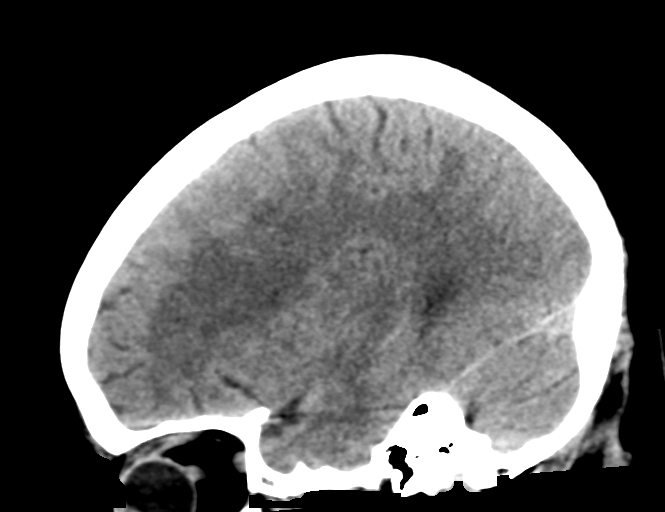
[im 29/57  brain]
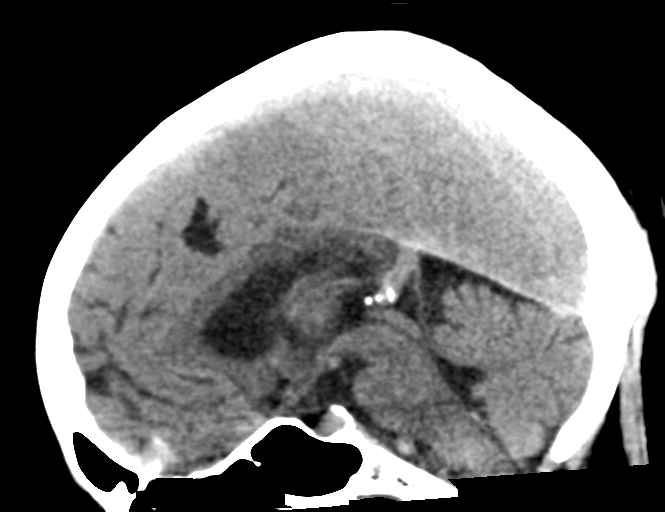
[im 38/57  brain]
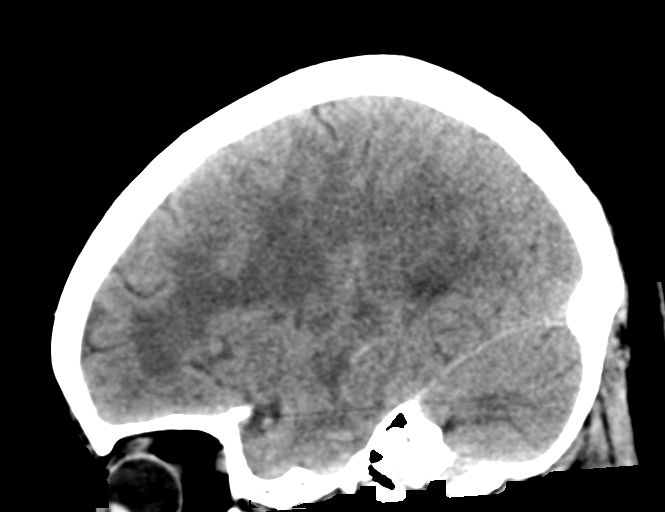

[16 of 47 positions shown; findings below may reference images not displayed]

FINDINGS: Brain: No evidence of acute infarction, hemorrhage, hydrocephalus,
extra-axial collection or mass lesion/mass effect.

Subcortical white matter and periventricular small vessel ischemic
changes.

Vascular: Intracranial atherosclerosis.

Skull: Normal. Negative for fracture or focal lesion.

Sinuses/Orbits: The visualized paranasal sinuses are essentially
clear. The mastoid air cells are unopacified.

Other: None.
IMPRESSION: No evidence of acute intracranial abnormality.

Small vessel ischemic changes.

## 2020-02-22 ENCOUNTER — Other Ambulatory Visit: Payer: Self-pay | Admitting: Internal Medicine

## 2020-02-22 DIAGNOSIS — E782 Mixed hyperlipidemia: Secondary | ICD-10-CM

## 2020-02-22 DIAGNOSIS — I10 Essential (primary) hypertension: Secondary | ICD-10-CM

## 2020-02-24 MED ORDER — SIMVASTATIN 40 MG PO TABS: 40.0000 mg | ORAL_TABLET | Freq: Every day | ORAL | 1 refills | Status: DC

## 2020-02-24 MED ORDER — METOPROLOL TARTRATE 25 MG PO TABS: 12.5000 mg | ORAL_TABLET | Freq: Two times a day (BID) | ORAL | 1 refills | Status: DC

## 2020-04-14 ENCOUNTER — Ambulatory Visit (INDEPENDENT_AMBULATORY_CARE_PROVIDER_SITE_OTHER): Payer: Self-pay | Admitting: Internal Medicine

## 2020-04-14 ENCOUNTER — Other Ambulatory Visit: Payer: Self-pay

## 2020-04-14 ENCOUNTER — Encounter: Payer: Self-pay | Admitting: Internal Medicine

## 2020-04-14 VITALS — BP 113/71 | HR 58 | Temp 98.2°F | Resp 16 | Ht 60.0 in | Wt 149.0 lb

## 2020-04-14 DIAGNOSIS — R1310 Dysphagia, unspecified: Secondary | ICD-10-CM

## 2020-04-14 DIAGNOSIS — I10 Essential (primary) hypertension: Secondary | ICD-10-CM

## 2020-04-14 NOTE — Progress Notes (Signed)
  Subjective:    Katherine Henson - 60 y.o. female MRN [id number]  Date of birth: 1960/06/18  HPI  Katherine Henson is here for HTN f/u. At last visit, discontinued HCTZ due to report of lightheadedness.   Chronic HTN Disease Monitoring:  Home BP Monitoring - 100s/60-70s  Chest pain- no  Dyspnea- no Headache - no  Medications: Amlodipine 10 mg, Losartan 50 mg, Metoprolol 12.5 mg BID  Compliance- yes Lightheadedness- no  Edema- no      Patient also endorses chronic dysphagia to solids and liquids. Progressive. Present for years. Has never seen a doctor for this. Reports has to sometimes swallow really hard to get food down. Occasionally has a sweet taste in mouth or loss of taste. No oral lesions or abnormalities noted. Very rarely has sensation of food coming back up. No epigastric pain or burning.       Health Maintenance:  Health Maintenance Due  Topic Date Due  . COLON CANCER SCREENING ANNUAL FOBT  03/18/2020    -  reports that she quit smoking about 9 years ago. She has a 15.00 pack-year smoking history. She has never used smokeless tobacco. - Review of Systems: Per HPI. - Past Medical History: Patient Active Problem List   Diagnosis Date Noted  . History of cerebellar stroke 11/26/2019  . Mixed hyperlipidemia 11/26/2019  . Positive double stranded DNA antibody test 04/10/2019  . Chronic hepatitis C without hepatic coma ([facility]) 12/06/2018  . HTN (hypertension) 04/30/2018   - Medications: reviewed and updated   Objective:   Physical Exam BP 113/71 (BP Location: Right Arm, Patient Position: Sitting, Cuff Size: Large)   Pulse (!) 58   Temp 98.2 F (36.8 C) (Oral)   Resp 16   Ht 5' (1.524 m)   Wt 149 lb (67.6 kg)   LMP 09/27/2012   SpO2 97%   BMI 29.10 kg/m  Physical Exam Constitutional:      General: She is not in acute distress.    Appearance: She is not diaphoretic.  HENT:     Mouth/Throat:     Mouth: Mucous membranes are moist.      Pharynx: Oropharynx is clear. No oropharyngeal exudate or posterior oropharyngeal erythema.  Cardiovascular:     Rate and Rhythm: Normal rate.  Pulmonary:     Effort: Pulmonary effort is normal. No respiratory distress.  Musculoskeletal:        General: Normal range of motion.     Cervical back: Normal range of motion and neck supple. No tenderness.  Lymphadenopathy:     Cervical: No cervical adenopathy.  Skin:    General: Skin is warm and dry.  Neurological:     Mental Status: She is alert and oriented to person, place, and time.  Psychiatric:        Mood and Affect: Affect normal.        Judgment: Judgment normal.            Assessment & Plan:   1. Primary hypertension BP at goal today. Continue current regimen.   2. Dysphagia, unspecified type Given chronic dysphagia that has been progressive without abnormalities on physical exam, warrants GI evaluation with imaging. May have some component of GERD but does not seem to be particularly prevalent, so will hold off on recommending daily PPI for acid suppression at present.  - Ambulatory referral to Gastroenterology   Phill Myron, D.O. 04/14/2020, 10:33 AM Primary Care at [facility]

## 2020-04-14 NOTE — Progress Notes (Signed)
Needs cologuard order

## 2020-04-22 ENCOUNTER — Other Ambulatory Visit: Payer: Self-pay | Admitting: Internal Medicine

## 2020-04-22 DIAGNOSIS — I10 Essential (primary) hypertension: Secondary | ICD-10-CM

## 2020-06-03 ENCOUNTER — Ambulatory Visit (INDEPENDENT_AMBULATORY_CARE_PROVIDER_SITE_OTHER): Payer: Self-pay | Admitting: Gastroenterology

## 2020-06-03 ENCOUNTER — Encounter: Payer: Self-pay | Admitting: Gastroenterology

## 2020-06-03 VITALS — BP 102/64 | HR 68 | Ht 60.0 in | Wt 149.0 lb

## 2020-06-03 DIAGNOSIS — R1310 Dysphagia, unspecified: Secondary | ICD-10-CM

## 2020-06-03 NOTE — Progress Notes (Addendum)
Referring Provider: Caryl Never* Primary Care Physician:  Nicolette Bang, DO  Reason for Consultation: Dysphagia  IMPRESSION: Dysphagia to solids and liquids    - history makes it difficulty to distinguish between oropharyngeal and esophageal dysphagia History of hemoccult + stools 2020 without overt bleeding or localizing symptoms    - no obvious source on colonoscopy 2020 Hyperplastic polyp on colonoscopy 2020 History of HCV with fibrosis on [facility]ro[facility]  EGD +/- biopsies +/- dilation recommended for further evaluation.    PLAN: EGD with possible dilation Esophagram/Modified barium swallow if EGD is negative  Please see the "Patient Instructions" section for addition details about the plan.  HPI: Katherine Henson is a 60 y.o. female referred for progressive dysphagia to solids and liquids.  I last saw the patient for hemoccult positive stools in 2020.  She had a colonoscopy at that time revealing internal hemorrhoids and a hyperplastic polyp.  Returns now on referral by Dr. Juleen Henson. The history is obtained through the patient and review of her electronic health record. Developed dysphagia since her last visit here. Happens intermittently 2-3 times every month with solids, liquids, and even her own saliva.  Food localizes to above the sternal notch. Bolus will pass if she relaxes, although intermittently requires regurgitation. There may be some delay in swallow initiation.  No history of food impaction. Occasionally has a sweet taste in her mouth. Nocturnal smothering. No significant cough. No globus. No reflux, odynophagia or dysphonia. No nasal regurgitation. Repetitive swallowing dose not clear the bolus.   Labs 11/2019: Normal CBC and CMP  No known family history of colon cancer or polyps. No family history of uterine/endometrial cancer, pancreatic cancer or gastric/stomach cancer.  Endoscopic history: Colonoscopy 03/19/2019: Hyperplastic polyp, internal  hemorrhoids   Past Medical History:  Diagnosis Date  . Allergy   . [place] 12/06/2018  . Depression   . HTN (hypertension)   . Hyperlipidemia     Past Surgical History:  Procedure Laterality Date  . NO PAST SURGERIES      Current Outpatient Medications  Medication Sig Dispense Refill  . amLODipine (NORVASC) 10 MG tablet Take 1 tablet by mouth once daily 90 tablet 1  . aspirin 81 MG chewable tablet Chew 1 tablet (81 mg total) by mouth daily. 30 tablet 0  . losartan (COZAAR) 50 MG tablet Take 1 tablet (50 mg total) by mouth daily. 90 tablet 0  . metoprolol tartrate (LOPRESSOR) 25 MG tablet Take 0.5 tablets (12.5 mg total) by mouth 2 (two) times daily. 90 tablet 1  . simvastatin (ZOCOR) 40 MG tablet Take 1 tablet (40 mg total) by mouth daily at 6 PM. 90 tablet 1   No current facility-administered medications for this visit.    Allergies as of 06/03/2020 - Review Complete 04/14/2020  Allergen Reaction Noted  . Penicillins Anaphylaxis 01/29/2011    Family History  Problem Relation Age of Onset  . Stroke Mother   . Anuerysm Mother        brain  . Diabetes Sister   . Breast cancer Sister   . Diabetes Brother   . Breast cancer Sister   . Breast cancer Niece   . Prostate cancer Brother      Physical Exam: General:   Alert,  well-nourished, pleasant and cooperative in NAD Head:  Normocephalic and atraumatic. Eyes:  Sclera clear, no icterus.   Conjunctiva pink. Neck: No LAD or thyromegaly Abdomen:  Soft, nontender, nondistended, normal bowel sounds, no rebound  or guarding. No hepatosplenomegaly.   Rectal:  Deferred  Msk:  Symmetrical. No boney deformities LAD: No inguinal or umbilical LAD Extremities:  No clubbing or edema. Neurologic:  Alert and  oriented x4;  grossly nonfocal Skin:  Intact without significant lesions or rashes. Psych:  Alert and cooperative. Normal mood and affect.     Juwana Thoreson L. Tarri Glenn, MD, MPH 06/03/2020, 2:31 PM

## 2020-06-03 NOTE — Patient Instructions (Addendum)
It was a pleasure to see you today. Based on our discussion, I am providing you with my recommendations below:  RECOMMENDATION(S):   . I am recommending an Endoscopy to better evaluate your symptoms  ENDOSCOPY:   . You have been scheduled for an endoscopy. Please follow written instructions given to you at your visit today.  INHALERS:   . If you use inhalers (even only as needed), please bring them with you on the day of your procedure.  BMI:  . If you are age 9 or younger, your body mass index should be between 19-25. Your There is no height or weight on file to calculate BMI. If this is out of the aformentioned range listed, please consider follow up with your Primary Care Provider.   Thank you for trusting me with your gastrointestinal care!    Thornton Park, MD, MPH

## 2020-06-05 ENCOUNTER — Ambulatory Visit (AMBULATORY_SURGERY_CENTER): Payer: Self-pay | Admitting: Gastroenterology

## 2020-06-05 ENCOUNTER — Encounter: Payer: Self-pay | Admitting: Gastroenterology

## 2020-06-05 ENCOUNTER — Other Ambulatory Visit: Payer: Self-pay

## 2020-06-05 ENCOUNTER — Other Ambulatory Visit (INDEPENDENT_AMBULATORY_CARE_PROVIDER_SITE_OTHER): Payer: No Typology Code available for payment source

## 2020-06-05 VITALS — BP 131/77 | HR 50 | Temp 96.6°F | Resp 11 | Ht 60.0 in | Wt 149.0 lb

## 2020-06-05 DIAGNOSIS — D132 Benign neoplasm of duodenum: Secondary | ICD-10-CM

## 2020-06-05 DIAGNOSIS — K2970 Gastritis, unspecified, without bleeding: Secondary | ICD-10-CM

## 2020-06-05 DIAGNOSIS — K219 Gastro-esophageal reflux disease without esophagitis: Secondary | ICD-10-CM

## 2020-06-05 DIAGNOSIS — R1310 Dysphagia, unspecified: Secondary | ICD-10-CM

## 2020-06-05 DIAGNOSIS — K2950 Unspecified chronic gastritis without bleeding: Secondary | ICD-10-CM

## 2020-06-05 DIAGNOSIS — B9681 Helicobacter pylori [H. pylori] as the cause of diseases classified elsewhere: Secondary | ICD-10-CM

## 2020-06-05 LAB — COMPREHENSIVE METABOLIC PANEL
ALT: 13 U/L (ref 0–35)
AST: 16 U/L (ref 0–37)
Albumin: 4.2 g/dL (ref 3.5–5.2)
Alkaline Phosphatase: 79 U/L (ref 39–117)
BUN: 10 mg/dL (ref 6–23)
CO2: 27 mEq/L (ref 19–32)
Calcium: 9.4 mg/dL (ref 8.4–10.5)
Chloride: 109 mEq/L (ref 96–112)
Creatinine, Ser: 0.74 mg/dL (ref 0.40–1.20)
GFR: 87.9 mL/min (ref >60.00)
Glucose, Bld: 90 mg/dL (ref 70–99)
Potassium: 3.9 mEq/L (ref 3.5–5.1)
Sodium: 143 mEq/L (ref 135–145)
Total Bilirubin: 0.5 mg/dL (ref 0.2–1.2)
Total Protein: 7.6 g/dL (ref 6.0–8.3)

## 2020-06-05 MED ORDER — PANTOPRAZOLE SODIUM 40 MG PO TBEC: 40.0000 mg | DELAYED_RELEASE_TABLET | Freq: Two times a day (BID) | ORAL | 3 refills | Status: DC

## 2020-06-05 MED ORDER — SODIUM CHLORIDE 0.9 % IV SOLN
500.0000 mL | Freq: Once | INTRAVENOUS | Status: DC
Start: 2020-06-05 — End: 2020-06-05

## 2020-06-05 NOTE — Progress Notes (Signed)
Medical history reviewed with no changes noted. VS assessed by C.W 

## 2020-06-05 NOTE — Op Note (Signed)
[facility] Patient Name: Ramla Martinique Procedure Date: 06/05/2020 4:03 PM MRN: [id number] Endoscopist: Thornton Park MD, MD Age: 60 Referring MD:  Date of Birth: December 06, 1960 Gender: Female Account #: [id number] Procedure:                Upper GI endoscopy Indications:              Dysphagia to solids and liquids                           - history makes it difficulty to distinguish                            between oropharyngeal and esophageal dysphagia                           History of hemoccult + stools 2020 without overt                            bleeding or localizing symptoms                           - no obvious source on colonoscopy 2020 Medicines:                Monitored Anesthesia Care Procedure:                Pre-Anesthesia Assessment:                           - Prior to the procedure, a History and Physical                            was performed, and patient medications and                            allergies were reviewed. The patient's tolerance of                            previous anesthesia was also reviewed. The risks                            and benefits of the procedure and the sedation                            options and risks were discussed with the patient.                            All questions were answered, and informed consent                            was obtained. Prior Anticoagulants: The patient has                            taken no previous anticoagulant or antiplatelet  agents. ASA Grade Assessment: III - A patient with                            severe systemic disease. After reviewing the risks                            and benefits, the patient was deemed in                            satisfactory condition to undergo the procedure.                           After obtaining informed consent, the endoscope was                            passed under direct vision. Throughout the                             procedure, the patient's blood pressure, pulse, and                            oxygen saturations were monitored continuously. The                            Endoscope was introduced through the mouth, and                            advanced to the third part of duodenum. The upper                            GI endoscopy was accomplished without difficulty.                            The patient tolerated the procedure well. Scope In: Scope Out: Findings:                 The examined esophagus was normal. No ring, web, or                            stricture. Biopsies were obtained from the proximal                            and distal esophagus with cold forceps for                            histology. Estimated blood loss was minimal.                           The Z-line was regular and was found 37 cm from the                            incisors.  Multiple small erosions with no bleeding and no                            stigmata of recent bleeding were found in the                            gastric body and in the gastric antrum. Biopsies                            were taken from the antrum, body, and fundus with a                            cold forceps for histology. Estimated blood loss                            was minimal.                           A single medium-sized sessile polyp with no                            bleeding was found in the second portion of the                            duodenum. Biopsies were taken from the top of the                            polyp and one verrucous appearing edge with a cold                            forceps for histology. Estimated blood loss was                            minimal.                           The cardia and gastric fundus were normal on                            retroflexion.                           The exam was otherwise without abnormality. Complications:            No  immediate complications. Estimated blood loss:                            Minimal. Estimated Blood Loss:     Estimated blood loss was minimal. Impression:               - Normal esophagus. Biopsied.                           - Z-line regular, 37 cm from the incisors.                           -  Erosive gastropathy with no bleeding and no                            stigmata of recent bleeding. Biopsied.                           - A single duodenal polyp. Biopsied.                           - The examination was otherwise normal. Recommendation:           - Patient has a contact number available for                            emergencies. The signs and symptoms of potential                            delayed complications were discussed with the                            patient. Return to normal activities tomorrow.                            Written discharge instructions were provided to the                            patient.                           - Resume previous diet.                           - Continue present medications.                           - Start pantoprazole 40 mg BID x 8 weeks.                           - Await pathology results.                           - No aspirin, ibuprofen, naproxen, or other                            non-steroidal anti-inflammatory drugs.                           - Proceed with CT abd for further evaluation of                            duodenal polyp. Thornton Park MD, MD 06/05/2020 4:29:18 PM This report has been signed electronically.

## 2020-06-05 NOTE — Progress Notes (Signed)
PT taken to PACU. Monitors in place. VSS. Report given to RN. 

## 2020-06-05 NOTE — Patient Instructions (Signed)
Resume previous diet Continue current medications Start pantoprazole 40mg   Twice daily for the next 8 weeks Await pathology results DO No take aspirin, ibuprofen, naproxen, or other NSAID's  Proceed with   YOU HAD AN ENDOSCOPIC PROCEDURE TODAY AT [facility]:   Refer to the procedure report that was given to you for any specific questions about what was found during the examination.  If the procedure report does not answer your questions, please call your gastroenterologist to clarify.  If you requested that your care partner not be given the details of your procedure findings, then the procedure report has been included in a sealed envelope for you to review at your convenience later.  YOU SHOULD EXPECT: Some feelings of bloating in the abdomen. Passage of more gas than usual.  Walking can help get rid of the air that was put into your GI tract during the procedure and reduce the bloating. If you had a lower endoscopy (such as a colonoscopy or flexible sigmoidoscopy) you may notice spotting of blood in your stool or on the toilet paper. If you underwent a bowel prep for your procedure, you may not have a normal bowel movement for a few days.  Please Note:  You might notice some irritation and congestion in your nose or some drainage.  This is from the oxygen used during your procedure.  There is no need for concern and it should clear up in a day or so.  SYMPTOMS TO REPORT IMMEDIATELY:   Following upper endoscopy (EGD)  Vomiting of blood or coffee ground material  New chest pain or pain under the shoulder blades  Painful or persistently difficult swallowing  New shortness of breath  Fever of 100F or higher  Black, tarry-looking stools  For urgent or emergent issues, a gastroenterologist can be reached at any hour by calling [phone]. Do not use MyChart messaging for urgent concerns.  DIET:  We do recommend a small meal at first, but then you may proceed to your  regular diet.  Drink plenty of fluids but you should avoid alcoholic beverages for 24 hours.  ACTIVITY:  You should plan to take it easy for the rest of today and you should NOT DRIVE or use heavy machinery until tomorrow (because of the sedation medicines used during the test).    FOLLOW UP: Our staff will call the number listed on your records 48-72 hours following your procedure to check on you and address any questions or concerns that you may have regarding the information given to you following your procedure. If we do not reach you, we will leave a message.  We will attempt to reach you two times.  During this call, we will ask if you have developed any symptoms of COVID 19. If you develop any symptoms (ie: fever, flu-like symptoms, shortness of breath, cough etc.) before then, please call [phone].  If you test positive for Covid 19 in the 2 weeks post procedure, please call and report this information to [place].    If any biopsies were taken you will be contacted by phone or by letter within the next 1-3 weeks.  Please call us at [phone] if you have not heard about the biopsies in 3 weeks.   SIGNATURES/CONFIDENTIALITY: You and/or your care partner have signed paperwork which will be entered into your electronic medical record.  These signatures attest to the fact that that the information above on your After Visit Summary has been reviewed and is  understood.  Full responsibility of the confidentiality of this discharge information lies with you and/or your care-partner. 

## 2020-06-08 ENCOUNTER — Telehealth: Payer: Self-pay

## 2020-06-08 ENCOUNTER — Other Ambulatory Visit: Payer: Self-pay

## 2020-06-08 DIAGNOSIS — K635 Polyp of colon: Secondary | ICD-10-CM

## 2020-06-08 NOTE — Telephone Encounter (Signed)
-----   Message from Thornton Park, MD sent at 06/05/2020  4:21 PM EDT ----- Please schedule CT abd with and without contrast to evaluate a large duodenal polyp  Thank you.  KLB

## 2020-06-08 NOTE — Telephone Encounter (Signed)
Pt scheduled for CT of abdomen at [facility] 06/12/20@4pm , pt to arrive there at 3:45pm. Pt to be NPO after 12noon except she will drink bottle 1 of contrast at 2pm and bottle 2 of contrast at 3pm. Pt aware of appt.

## 2020-06-09 ENCOUNTER — Telehealth: Payer: Self-pay | Admitting: *Deleted

## 2020-06-09 NOTE — Telephone Encounter (Signed)
  Follow up Call-  Call back number 06/05/2020 03/19/2019  Post procedure Call Back phone  # [phone]  Permission to leave phone message Yes Yes  Some recent data might be hidden     Patient questions:  Do you have a fever, pain , or abdominal swelling? No. Pain Score  0 *  Have you tolerated food without any problems? Yes.    Have you been able to return to your normal activities? Yes.    Do you have any questions about your discharge instructions: Diet   No. Medications  No. Follow up visit  No.  Do you have questions or concerns about your Care? No.  Actions: * If pain score is 4 or above: No action needed, pain <4.  1. Have you developed a fever since your procedure? no  2.   Have you had an respiratory symptoms (SOB or cough) since your procedure? no  3.   Have you tested positive for COVID 19 since your procedure no  4.   Have you had any family members/close contacts diagnosed with the COVID 19 since your procedure?  no   If yes to any of these questions please route to Joylene John, RN and Joella Prince, RN

## 2020-06-12 ENCOUNTER — Other Ambulatory Visit: Payer: Self-pay

## 2020-06-12 ENCOUNTER — Ambulatory Visit (HOSPITAL_COMMUNITY)
Admission: RE | Admit: 2020-06-12 | Discharge: 2020-06-12 | Disposition: A | Discharge status: Home / Self Care | Payer: Self-pay | Source: Ambulatory Visit | Attending: Gastroenterology | Admitting: Gastroenterology

## 2020-06-12 DIAGNOSIS — K635 Polyp of colon: Secondary | ICD-10-CM | POA: Insufficient documentation

## 2020-06-12 MED ORDER — IOHEXOL 300 MG/ML  SOLN
100.0000 mL | Freq: Once | INTRAMUSCULAR | Status: AC | PRN
  Administered 2020-06-12: 100 mL via INTRAVENOUS

## 2020-06-18 ENCOUNTER — Other Ambulatory Visit: Payer: Self-pay

## 2020-06-18 DIAGNOSIS — A048 Other specified bacterial intestinal infections: Secondary | ICD-10-CM

## 2020-06-18 MED ORDER — TETRACYCLINE HCL 500 MG PO CAPS
500.0000 mg | ORAL_CAPSULE | Freq: Four times a day (QID) | ORAL | 0 refills | Status: DC
Start: 2020-06-18 — End: 2020-09-15
  Filled 2020-06-18: qty 56, 14d supply, fill #0

## 2020-06-18 MED ORDER — TETRACYCLINE HCL 500 MG PO CAPS: 500.0000 mg | ORAL_CAPSULE | Freq: Four times a day (QID) | ORAL | 0 refills | Status: DC

## 2020-06-18 MED ORDER — METRONIDAZOLE 500 MG PO TABS
500.0000 mg | ORAL_TABLET | Freq: Four times a day (QID) | ORAL | 0 refills | Status: DC
  Filled 2020-06-18: qty 56, 14d supply, fill #0

## 2020-06-18 MED ORDER — BISMUTH 262 MG PO CHEW
524.0000 mg | CHEWABLE_TABLET | Freq: Four times a day (QID) | ORAL | 0 refills | Status: DC
  Filled 2020-06-18: qty 112, fill #0

## 2020-06-18 MED ORDER — BISMUTH 262 MG PO CHEW: 524.0000 mg | CHEWABLE_TABLET | Freq: Four times a day (QID) | ORAL | 0 refills | Status: DC

## 2020-06-18 MED ORDER — METRONIDAZOLE 500 MG PO TABS: 500.0000 mg | ORAL_TABLET | Freq: Four times a day (QID) | ORAL | 0 refills | Status: DC

## 2020-06-22 ENCOUNTER — Other Ambulatory Visit: Payer: Self-pay

## 2020-06-23 ENCOUNTER — Other Ambulatory Visit: Payer: Self-pay

## 2020-06-24 ENCOUNTER — Other Ambulatory Visit: Payer: Self-pay

## 2020-06-24 DIAGNOSIS — D489 Neoplasm of uncertain behavior, unspecified: Secondary | ICD-10-CM

## 2020-07-07 ENCOUNTER — Other Ambulatory Visit: Payer: Self-pay | Admitting: Internal Medicine

## 2020-07-07 DIAGNOSIS — I10 Essential (primary) hypertension: Secondary | ICD-10-CM

## 2020-07-21 ENCOUNTER — Other Ambulatory Visit (INDEPENDENT_AMBULATORY_CARE_PROVIDER_SITE_OTHER): Payer: No Typology Code available for payment source

## 2020-07-21 ENCOUNTER — Ambulatory Visit (INDEPENDENT_AMBULATORY_CARE_PROVIDER_SITE_OTHER): Payer: Self-pay | Admitting: Gastroenterology

## 2020-07-21 ENCOUNTER — Encounter: Payer: Self-pay | Admitting: Gastroenterology

## 2020-07-21 VITALS — BP 128/72 | HR 70 | Ht 60.0 in | Wt 147.8 lb

## 2020-07-21 DIAGNOSIS — A048 Other specified bacterial intestinal infections: Secondary | ICD-10-CM

## 2020-07-21 DIAGNOSIS — R198 Other specified symptoms and signs involving the digestive system and abdomen: Secondary | ICD-10-CM

## 2020-07-21 DIAGNOSIS — D132 Benign neoplasm of duodenum: Secondary | ICD-10-CM

## 2020-07-21 DIAGNOSIS — K635 Polyp of colon: Secondary | ICD-10-CM

## 2020-07-21 DIAGNOSIS — R1310 Dysphagia, unspecified: Secondary | ICD-10-CM

## 2020-07-21 DIAGNOSIS — K209 Esophagitis, unspecified without bleeding: Secondary | ICD-10-CM

## 2020-07-21 LAB — BASIC METABOLIC PANEL
BUN: 12 mg/dL (ref 6–23)
CO2: 26 mEq/L (ref 19–32)
Calcium: 9.9 mg/dL (ref 8.4–10.5)
Chloride: 109 mEq/L (ref 96–112)
Creatinine, Ser: 0.88 mg/dL (ref 0.40–1.20)
GFR: 71.33 mL/min (ref >60.00)
Glucose, Bld: 95 mg/dL (ref 70–99)
Potassium: 3.7 mEq/L (ref 3.5–5.1)
Sodium: 143 mEq/L (ref 135–145)

## 2020-07-21 LAB — CBC
HCT: 35.8 % — ABNORMAL LOW (ref 36.0–46.0)
Hemoglobin: 11.8 g/dL — ABNORMAL LOW (ref 12.0–15.0)
MCHC: 32.9 g/dL (ref 30.0–36.0)
MCV: 83.2 fl (ref 78.0–100.0)
Platelets: 305 10*3/uL (ref 150.0–400.0)
RBC: 4.3 Mil/uL (ref 3.87–5.11)
RDW: 15.8 % — ABNORMAL HIGH (ref 11.5–15.5)
WBC: 7.5 10*3/uL (ref 4.0–10.5)

## 2020-07-21 LAB — PROTIME-INR
INR: 1.2 ratio — ABNORMAL HIGH (ref 0.8–1.0)
Prothrombin Time: 13.1 s (ref 9.6–13.1)

## 2020-07-21 NOTE — Patient Instructions (Addendum)
You have been scheduled for an endoscopy. Please follow written instructions given to you at your visit today. If you use inhalers (even only as needed), please bring them with you on the day of your procedure.  Your provider has requested that you go to the basement level for lab work before leaving today. Press "B" on the elevator. The lab is located at the first door on the left as you exit the elevator.  Due to recent changes in healthcare laws, you may see the results of your imaging and laboratory studies on MyChart before your provider has had a chance to review them.  We understand that in some cases there may be results that are confusing or concerning to you. Not all laboratory results come back in the same time frame and the provider may be waiting for multiple results in order to interpret others.  Please give [place] 48 hours in order for your provider to thoroughly review all the results before contacting the office for clarification of your results.    If you are age 46 or younger, your body mass index should be between 19-25. Your Body mass index is 28.87 kg/m. If this is out of the aformentioned range listed, please consider follow up with your Primary Care Provider.   Thank you for choosing me and [facility].  Dr. Rush Landmark

## 2020-07-22 ENCOUNTER — Encounter: Payer: Self-pay | Admitting: Gastroenterology

## 2020-07-22 ENCOUNTER — Other Ambulatory Visit: Payer: Self-pay

## 2020-07-22 DIAGNOSIS — R198 Other specified symptoms and signs involving the digestive system and abdomen: Secondary | ICD-10-CM | POA: Insufficient documentation

## 2020-07-22 DIAGNOSIS — D649 Anemia, unspecified: Secondary | ICD-10-CM

## 2020-07-22 DIAGNOSIS — D132 Benign neoplasm of duodenum: Secondary | ICD-10-CM | POA: Insufficient documentation

## 2020-07-22 DIAGNOSIS — K209 Esophagitis, unspecified without bleeding: Secondary | ICD-10-CM | POA: Insufficient documentation

## 2020-07-22 DIAGNOSIS — A048 Other specified bacterial intestinal infections: Secondary | ICD-10-CM | POA: Insufficient documentation

## 2020-07-22 DIAGNOSIS — R1310 Dysphagia, unspecified: Secondary | ICD-10-CM | POA: Insufficient documentation

## 2020-07-22 NOTE — Progress Notes (Signed)
[place] VISIT   Primary Care Provider Nicolette Bang, DO [place] [address] [phone]  Referring Provider Dr. Tarri Glenn  Patient Profile: Katherine Henson is a 60 y.o. female with a pmh significant for prior CVA, hypertension, hyperlipidemia, MDD, prior HCV infection (status posttreatment), microscopic esophagitis, PUD secondary to H. pylori (awaiting eradication confirmation), duodenal adenoma.  The patient presents to the [facility] for an evaluation and management of problem(s) noted below:  Problem List 1. Helicobacter pylori infection   2. Adenomatous duodenal polyp   3. Dysphagia, unspecified type   4. Esophagitis determined by endoscopy   5. Abnormal findings on esophagogastroduodenoscopy (EGD)     History of Present Illness Please see initial consultation note and prior progress notes by Dr. Tarri Glenn for full details of HPI.  Interval History The patient recently was evaluated by Dr. Tarri Glenn for issues of dysphagia.  An upper endoscopy was recommended with potential modified barium swallow in the future.  At time of endoscopy patient was also found to have a large polypoid lesion of the duodenum in D2.  This was biopsied and returned as precancerous tubular adenoma.  CT scanning did not show evidence of Barrett's of an invasive lesion.  It is for this reason that the patient is referred for consideration of advanced resection attempt.  The patient is accompanied by her son today.  Patient states that her dysphagia symptoms are improved currently.  She is eating what she wants to slowly.  She is not having significant weight loss unintentionally.  She feels her GERD symptoms are improved at this time.  GI Review of Systems Positive as above Negative for odynophagia, nausea, vomiting, bloating, change in bowel habits, melena, hematochezia  Review of Systems General: Denies fevers/chills/weight loss  unintentionally Cardiovascular: Denies chest pain/palpitations Pulmonary: Denies shortness of breath Gastroenterological: See HPI Genitourinary: Denies darkened urine Hematological: Denies easy bruising/bleeding Dermatological: Denies jaundice Psychological: Mood is stable   Medications Current Outpatient Medications  Medication Sig Dispense Refill  . amLODipine (NORVASC) 10 MG tablet Take 1 tablet by mouth once daily 90 tablet 1  . aspirin 81 MG chewable tablet Chew 1 tablet (81 mg total) by mouth daily. 30 tablet 0  . losartan (COZAAR) 50 MG tablet Take 1 tablet by mouth once daily 90 tablet 0  . metoprolol tartrate (LOPRESSOR) 25 MG tablet Take 0.5 tablets (12.5 mg total) by mouth 2 (two) times daily. 90 tablet 1  . pantoprazole (PROTONIX) 40 MG tablet Take 1 tablet (40 mg total) by mouth 2 (two) times daily. 90 tablet 3  . simvastatin (ZOCOR) 40 MG tablet Take 1 tablet (40 mg total) by mouth daily at 6 PM. 90 tablet 1  . tetracycline (SUMYCIN) 500 MG capsule Take 1 capsule (500 mg total) by mouth 4 (four) times daily. 56 capsule 0   No current facility-administered medications for this visit.    Allergies Allergies  Allergen Reactions  . Penicillins Anaphylaxis    Did it involve swelling of the face/tongue/throat, SOB, or low BP? No Did it involve sudden or severe rash/hives, skin peeling, or any reaction on the inside of your mouth or nose? Yes Did you need to seek medical attention at a hospital or doctor's office? No When did it last happen?30 years ago If all above answers are "NO", may proceed with cephalosporin use.    Histories Past Medical History:  Diagnosis Date  . Allergy   . Chronic hepatitis C ([place]) 12/06/2018  .  Depression   . HTN (hypertension)   . Hyperlipidemia   . Stroke [facility])    Past Surgical History:  Procedure Laterality Date  . NO PAST SURGERIES     Social History   Socioeconomic History  . Marital status: Single    Spouse name: Not  on file  . Number of children: 5  . Years of education: Not on file  . Highest education level: Not on file  Occupational History  . Occupation: housekeeper  Tobacco Use  . Smoking status: Former Smoker    Packs/day: 0.50    Years: 30.00    Pack years: 15.00    Quit date: 12/06/2010    Years since quitting: 9.6  . Smokeless tobacco: Never Used  Vaping Use  . Vaping Use: Never used  Substance and Sexual Activity  . Alcohol use: Yes    Alcohol/week: 1.0 standard drink    Types: 1 Glasses of wine per week    Comment: occasional  . Drug use: Not Currently    Types: Marijuana  . Sexual activity: Not Currently  Other Topics Concern  . Not on file  Social History Narrative   Left handed   One story home   No caffeine   Social Determinants of Health   Financial Resource Strain: Not on file  Food Insecurity: Not on file  Transportation Needs: Not on file  Physical Activity: Not on file  Stress: Not on file  Social Connections: Not on file  Intimate Partner Violence: Not on file   Family History  Problem Relation Age of Onset  . Stroke Mother   . Anuerysm Mother        brain  . Diabetes Sister   . Breast cancer Sister   . Diabetes Brother   . Breast cancer Sister   . Breast cancer Niece   . Prostate cancer Brother   . Colon cancer Neg Hx   . Esophageal cancer Neg Hx   . Liver disease Neg Hx   . Stomach cancer Neg Hx   . Pancreatic cancer Neg Hx   . Inflammatory bowel disease Neg Hx   . Rectal cancer Neg Hx    I have reviewed her medical, social, and family history in detail and updated the electronic medical record as necessary.    PHYSICAL EXAMINATION  BP 128/72   Pulse 70   Ht 5' (1.524 m)   Wt 147 lb 12.8 oz (67 kg)   LMP 09/27/2012   SpO2 95%   BMI 28.87 kg/m  Wt Readings from Last 3 Encounters:  07/21/20 147 lb 12.8 oz (67 kg)  06/05/20 149 lb (67.6 kg)  06/03/20 149 lb (67.6 kg)   GEN: NAD, appears stated age, doesn't appear chronically  ill PSYCH: Cooperative, without pressured speech EYE: Conjunctivae pink, sclerae anicteric ENT: MMM, without oral ulcers, no erythema or exudates noted NECK: Supple CV: RR without R/Gs  RESP: CTAB posteriorly, without wheezing GI: NABS, soft, NT/ND, without rebound or guarding, no HSM appreciated GU: DRE shows MSK/EXT: _ edema, no palmar erythema SKIN: No jaundice, no spider angiomata, no concerning rashes NEURO:  Alert & Oriented x 3, no focal deficits, no evidence of asterixis   REVIEW OF DATA  I reviewed the following data at the time of this encounter:  GI Procedures and Studies  April 2022 EGD - Normal esophagus. Biopsied. - Z-line regular, 37 cm from the incisors. - Erosive gastropathy with no bleeding and no stigmata of recent bleeding. Biopsied. - A  single duodenal polyp. Biopsied. - The examination was otherwise normal.  Pathology Diagnosis 1. Surgical [P], duodenal polyp - DUODENAL TUBULAR ADENOMA. - NO INVASIVE CARCINOMA. 2. Surgical [P], fundus, gastric antrum and gastric body - MILDLY ACTIVE CHRONIC HELICOBACTER PYLORI GASTRITIS. - WARTHIN-STARRY POSITIVE FOR HELICOBACTER PYLORI. - NO INTESTINAL METAPLASIA, DYSPLASIA OR CARCINOMA. 3. Surgical [P], distal esophagus - GASTROESOPHAGEAL MUCOSA WITH MILD INFLAMMATION CONSISTENT WITH REFLUX. - NO EOSINOPHILIC ESOPHAGITIS (LESS THAN 5 PER HIGH POWER FIELD). - NO INTESTINAL METAPLASIA, DYSPLASIA OR CARCINOMA. - 4. Surgical [P], mid esophagus and proximal - UNREMARKABLE SQUAMOUS MUCOSA. - NO EOSINOPHILIC ESOPHAGITIS (LESS THAN 2 PER HIGH POWER FIELD).  Laboratory Studies  Reviewed those in epic  Imaging Studies  April 2022 CT abdomen pelvis with contrast IMPRESSION: 1. No acute intra-abdominal or intrapelvic abnormality. Please note this is a nondiagnostic study for a duodenal polyp evaluation. Consider direct visualization if clinically indicated. 2. A 1.8 cm right ovarian lesion likely represents a  teratoma. 3.  Aortic Atherosclerosis (ICD10-I70.0).   ASSESSMENT  Katherine Henson is a 60 y.o. female with a pmh significant for prior CVA, hypertension, hyperlipidemia, MDD, prior HCV infection (status posttreatment), microscopic esophagitis, PUD secondary to H. pylori (awaiting eradication confirmation), duodenal adenoma.  The patient is seen today for evaluation and management of:  1. Helicobacter pylori infection   2. Adenomatous duodenal polyp   3. Dysphagia, unspecified type   4. Esophagitis determined by endoscopy   5. Abnormal findings on esophagogastroduodenoscopy (EGD)    The patient is clinically and hemodynamically stable.  Based upon the description and endoscopic pictures I do feel that it is reasonable to pursue an Advanced Polypectomy attempt of the polyp/lesion.  We discussed some of the techniques of advanced polypectomy which include Endoscopic Mucosal Resection, OVESCO Full-Thickness Resection, Endorotor Morcellation, and Tissue Ablation via Fulguration.  We also reviewed images of typical techniques as noted above.  The risks and benefits of endoscopic evaluation were discussed with the patient; these include but are not limited to the risk of perforation, infection, bleeding, missed lesions, lack of diagnosis, severe illness requiring hospitalization, as well as anesthesia and sedation related illnesses.  During attempts at advanced resection, the risks of bleeding and perforation/leak are increased as opposed to diagnostic and screening procedures, and that was discussed with the patient as well.   In addition, I explained that with the possible need for piecemeal resection, subsequent short-interval endoscopic evaluation for follow up and potential retreatment of the lesion/area may be necessary.  I did offer, a referral to surgery in order for patient to have opportunity to discuss surgical management/intervention prior to finalizing decision for attempt at endoscopic removal,  however, the patient deferred on this.  If, after attempt at removal of the polyp/lesion, it is found that the patient has a complication or that an invasive lesion or malignant lesion is found, or that the polyp/lesion continues to recur, the patient is aware and understands that surgery may still be indicated/required.  All patient questions were answered, to the best of my ability, and the patient agrees to the aforementioned plan of action with follow-up as indicated.   PLAN  Preprocedure labs to be obtained Proceed with scheduling EGD/EUS with possible EMR of duodenal adenoma We will plan to perform biopsies of stomach to ensure eradication of H. pylori if it is not done before our procedure Follow-up to be dictated based on findings at time of procedure   Orders Placed This Encounter  Procedures  . Procedural/ Surgical Case Request:  ESOPHAGOGASTRODUODENOSCOPY (EGD) WITH PROPOFOL, UPPER ESOPHAGEAL ENDOSCOPIC ULTRASOUND (EUS)  . CBC  . Basic Metabolic Panel (BMET)  . INR/PT    New Prescriptions   No medications on file   Modified Medications   No medications on file    Planned Follow Up No follow-ups on file.   Total Time in Face-to-Face and in Coordination of Care for patient including independent/personal interpretation/review of prior testing, medical history, examination, medication adjustment, communicating results with the patient directly, and documentation with the EHR is 30 minutes.   Justice Britain, MD [facility]oscopy Office # [phone]

## 2020-08-04 ENCOUNTER — Other Ambulatory Visit: Payer: Self-pay | Admitting: Internal Medicine

## 2020-08-04 DIAGNOSIS — I10 Essential (primary) hypertension: Secondary | ICD-10-CM

## 2020-08-10 ENCOUNTER — Other Ambulatory Visit: Payer: Self-pay | Admitting: Internal Medicine

## 2020-08-10 ENCOUNTER — Encounter: Payer: No Typology Code available for payment source | Admitting: Obstetrics and Gynecology

## 2020-08-10 DIAGNOSIS — I10 Essential (primary) hypertension: Secondary | ICD-10-CM

## 2020-09-09 ENCOUNTER — Ambulatory Visit: Payer: No Typology Code available for payment source | Admitting: Gastroenterology

## 2020-09-09 NOTE — Progress Notes (Signed)
Patient ID: Katherine Henson, female    DOB: 1960-09-14  MRN: [id number]  CC: Hypertension Follow-Up  Subjective: Katherine Henson is a 60 y.o. female who presents for hypertension follow-up.   Her concerns today include:  HYPERTENSION FOLLOW-UP: 04/14/2020 per DO note: BP at goal today. Continue current regimen.    09/11/2020: Currently taking: see medication list Med Adherence: [x]  Yes    []  No Medication side effects: []  Yes    [x]  No Adherence with salt restriction (low-salt diet): [x]  Yes    []  No Exercise: Yes [x]  No []  Home Monitoring?: [x]  Yes    []  No Monitoring Frequency: [x]  Yes    []  No Home BP results range: [x]  Yes, similar to today's office reading Smoking []  Yes [x]  No SOB? []  Yes    [x]  No Chest Pain?: []  Yes    [x]  No   Patient Active Problem List   Diagnosis Date Noted   Abnormal findings on esophagogastroduodenoscopy (EGD) 07/22/2020   Dysphagia 07/22/2020   Adenomatous duodenal polyp 77/41/2878   Helicobacter pylori infection 07/22/2020   Esophagitis determined by endoscopy 07/22/2020   History of cerebellar stroke 11/26/2019   Mixed hyperlipidemia 11/26/2019   Positive double stranded DNA antibody test 04/10/2019   Chronic hepatitis C without hepatic coma ([place]) 12/06/2018   HTN (hypertension) 04/30/2018     Current Outpatient Medications on File Prior to Visit  Medication Sig Dispense Refill   aspirin 81 MG chewable tablet Chew 1 tablet (81 mg total) by mouth daily. 30 tablet 0   pantoprazole (PROTONIX) 40 MG tablet Take 1 tablet (40 mg total) by mouth 2 (two) times daily. 90 tablet 3   simvastatin (ZOCOR) 40 MG tablet Take 1 tablet (40 mg total) by mouth daily at 6 PM. 90 tablet 1   tetracycline (SUMYCIN) 500 MG capsule Take 1 capsule (500 mg total) by mouth 4 (four) times daily. 56 capsule 0   No current facility-administered medications on file prior to visit.    Allergies  Allergen Reactions   Penicillins Anaphylaxis    Did it involve  swelling of the face/tongue/throat, SOB, or low BP? No Did it involve sudden or severe rash/hives, skin peeling, or any reaction on the inside of your mouth or nose? Yes Did you need to seek medical attention at a hospital or doctor's office? No When did it last happen?  30 years ago     If all above answers are "NO", may proceed with cephalosporin use.    Social History   Socioeconomic History   Marital status: Single    Spouse name: Not on file   Number of children: 5   Years of education: Not on file   Highest education level: Not on file  Occupational History   Occupation: housekeeper  Tobacco Use   Smoking status: Former    Packs/day: 0.50    Years: 30.00    Pack years: 15.00    Types: Cigarettes    Quit date: 12/06/2010    Years since quitting: 9.7   Smokeless tobacco: Never  Vaping Use   Vaping Use: Never used  Substance and Sexual Activity   Alcohol use: Yes    Alcohol/week: 1.0 standard drink    Types: 1 Glasses of wine per week    Comment: occasional   Drug use: Not Currently    Types: Marijuana   Sexual activity: Not Currently  Other Topics Concern   Not on file  Social History Narrative   Left  handed   One story home   No caffeine   Social Determinants of Radio broadcast assistant Strain: Not on file  Food Insecurity: Not on file  Transportation Needs: Not on file  Physical Activity: Not on file  Stress: Not on file  Social Connections: Not on file  Intimate Partner Violence: Not on file    Family History  Problem Relation Age of Onset   Stroke Mother    Anuerysm Mother        brain   Diabetes Sister    Breast cancer Sister    Diabetes Brother    Breast cancer Sister    Breast cancer Niece    Prostate cancer Brother    Colon cancer Neg Hx    Esophageal cancer Neg Hx    Liver disease Neg Hx    Stomach cancer Neg Hx    Pancreatic cancer Neg Hx    Inflammatory bowel disease Neg Hx    Rectal cancer Neg Hx     Past Surgical History:   Procedure Laterality Date   NO PAST SURGERIES      ROS: Review of Systems Negative except as stated above  PHYSICAL EXAM: BP 123/78 (BP Location: Left Arm, Patient Position: Sitting, Cuff Size: Normal)   Pulse (!) 58   Temp 98.1 F (36.7 C)   Resp 16   Ht 5' (1.524 m)   Wt 144 lb (65.3 kg)   LMP 09/27/2012   SpO2 98%   BMI 28.12 kg/m   Physical Exam HENT:     Head: Normocephalic and atraumatic.  Eyes:     Extraocular Movements: Extraocular movements intact.     Conjunctiva/sclera: Conjunctivae normal.     Pupils: Pupils are equal, round, and reactive to light.  Cardiovascular:     Rate and Rhythm: Bradycardia present.     Pulses: Normal pulses.     Heart sounds: Normal heart sounds.  Pulmonary:     Effort: Pulmonary effort is normal.     Breath sounds: Normal breath sounds.  Musculoskeletal:     Cervical back: Normal range of motion and neck supple.  Neurological:     General: No focal deficit present.     Mental Status: She is alert and oriented to person, place, and time.  Psychiatric:        Mood and Affect: Mood normal.        Behavior: Behavior normal.     ASSESSMENT AND PLAN: 1. Essential hypertension: - Blood pressure at goal today in office. Home blood pressures well controlled.  - Continue Amlodipine, Losartan, and Metoprolol Tartrate as prescribed.  - Counseled on blood pressure goal of less than 130/80, low-sodium, DASH diet, medication compliance, 150 minutes of moderate intensity exercise per week as tolerated. Discussed medication compliance, adverse effects. - BMP last obtained 07/21/2020. - Follow-up with primary provider in 3 months or sooner if needed. - amLODipine (NORVASC) 10 MG tablet; Take 1 tablet (10 mg total) by mouth daily.  Dispense: 90 tablet; Refill: 0 - losartan (COZAAR) 50 MG tablet; Take 1 tablet (50 mg total) by mouth daily.  Dispense: 90 tablet; Refill: 0 - metoprolol tartrate (LOPRESSOR) 25 MG tablet; Take 1/2 (one-half)  tablet by mouth twice daily  Dispense: 30 tablet; Refill: 2    Patient was given the opportunity to ask questions.  Patient verbalized understanding of the plan and was able to repeat key elements of the plan. Patient was given clear instructions to go to Emergency Department or  return to medical center if symptoms don't improve, worsen, or new problems develop.The patient verbalized understanding.   Requested Prescriptions   Signed Prescriptions Disp Refills   amLODipine (NORVASC) 10 MG tablet 90 tablet 0    Sig: Take 1 tablet (10 mg total) by mouth daily.   losartan (COZAAR) 50 MG tablet 90 tablet 0    Sig: Take 1 tablet (50 mg total) by mouth daily.   metoprolol tartrate (LOPRESSOR) 25 MG tablet 30 tablet 2    Sig: Take 1/2 (one-half) tablet by mouth twice daily    Return in about 3 months (around 12/12/2020) for Follow-Up hypertension .  Camillia Herter, NP

## 2020-09-11 ENCOUNTER — Ambulatory Visit: Payer: No Typology Code available for payment source | Admitting: Internal Medicine

## 2020-09-11 ENCOUNTER — Encounter: Payer: Self-pay | Admitting: Family

## 2020-09-11 ENCOUNTER — Other Ambulatory Visit: Payer: Self-pay

## 2020-09-11 ENCOUNTER — Ambulatory Visit (INDEPENDENT_AMBULATORY_CARE_PROVIDER_SITE_OTHER): Payer: Self-pay | Admitting: Family

## 2020-09-11 VITALS — BP 123/78 | HR 58 | Temp 98.1°F | Resp 16 | Ht 60.0 in | Wt 144.0 lb

## 2020-09-11 DIAGNOSIS — I10 Essential (primary) hypertension: Secondary | ICD-10-CM

## 2020-09-11 MED ORDER — METOPROLOL TARTRATE 25 MG PO TABS: ORAL_TABLET | ORAL | 2 refills | Status: DC

## 2020-09-11 MED ORDER — AMLODIPINE BESYLATE 10 MG PO TABS: 10.0000 mg | ORAL_TABLET | Freq: Every day | ORAL | 0 refills | Status: DC

## 2020-09-11 MED ORDER — LOSARTAN POTASSIUM 50 MG PO TABS: 50.0000 mg | ORAL_TABLET | Freq: Every day | ORAL | 0 refills | Status: DC

## 2020-09-11 NOTE — Progress Notes (Signed)
Pt presents for hypertension f/u no other concerns

## 2020-09-14 ENCOUNTER — Other Ambulatory Visit (INDEPENDENT_AMBULATORY_CARE_PROVIDER_SITE_OTHER): Payer: Medicaid Other

## 2020-09-14 DIAGNOSIS — D649 Anemia, unspecified: Secondary | ICD-10-CM

## 2020-09-14 LAB — IBC + FERRITIN
Ferritin: 16.2 ng/mL (ref 10.0–291.0)
Iron: 64 ug/dL (ref 42–145)
Saturation Ratios: 14.1 % — ABNORMAL LOW (ref 20.0–50.0)
Transferrin: 324 mg/dL (ref 212.0–360.0)

## 2020-09-14 LAB — FOLATE: Folate: 11.2 ng/mL (ref >5.9)

## 2020-09-14 LAB — VITAMIN B12: Vitamin B-12: 201 pg/mL — ABNORMAL LOW (ref 211–911)

## 2020-09-15 ENCOUNTER — Telehealth: Payer: Self-pay | Admitting: Gastroenterology

## 2020-09-15 NOTE — Telephone Encounter (Signed)
The patient has been notified of this information and all questions answered.  The pt will begin B12 and Ferrous gluconate daily

## 2020-09-15 NOTE — Telephone Encounter (Signed)
Inbound call from patient. Requesting a call back to discuss the vitamins she was told to get. States she forgot the name.

## 2020-09-15 NOTE — Telephone Encounter (Signed)
I called the pt and gave her the B12 and ferrous gluconate doses again.  She verbalized understanding of the information.  She will call if she has any further questions.

## 2020-09-16 ENCOUNTER — Encounter (HOSPITAL_COMMUNITY): Payer: Self-pay | Admitting: Gastroenterology

## 2020-09-16 ENCOUNTER — Other Ambulatory Visit: Payer: Self-pay

## 2020-09-21 ENCOUNTER — Encounter (HOSPITAL_COMMUNITY)
Admission: RE | Disposition: A | Discharge status: Home / Self Care | Payer: Self-pay | Source: Home / Self Care | Attending: Gastroenterology

## 2020-09-21 ENCOUNTER — Other Ambulatory Visit: Payer: Self-pay

## 2020-09-21 ENCOUNTER — Ambulatory Visit (HOSPITAL_COMMUNITY): Payer: Self-pay | Admitting: Anesthesiology

## 2020-09-21 ENCOUNTER — Encounter (HOSPITAL_COMMUNITY): Payer: Self-pay | Admitting: Gastroenterology

## 2020-09-21 ENCOUNTER — Ambulatory Visit (HOSPITAL_COMMUNITY)
Admission: RE | Admit: 2020-09-21 | Discharge: 2020-09-21 | Disposition: A | Discharge status: Home / Self Care | Payer: Self-pay | Attending: Gastroenterology | Admitting: Gastroenterology

## 2020-09-21 DIAGNOSIS — A048 Other specified bacterial intestinal infections: Secondary | ICD-10-CM

## 2020-09-21 DIAGNOSIS — Z803 Family history of malignant neoplasm of breast: Secondary | ICD-10-CM | POA: Insufficient documentation

## 2020-09-21 DIAGNOSIS — D132 Benign neoplasm of duodenum: Secondary | ICD-10-CM | POA: Insufficient documentation

## 2020-09-21 DIAGNOSIS — Z87891 Personal history of nicotine dependence: Secondary | ICD-10-CM | POA: Insufficient documentation

## 2020-09-21 DIAGNOSIS — Z8042 Family history of malignant neoplasm of prostate: Secondary | ICD-10-CM | POA: Insufficient documentation

## 2020-09-21 DIAGNOSIS — K317 Polyp of stomach and duodenum: Secondary | ICD-10-CM

## 2020-09-21 DIAGNOSIS — Z833 Family history of diabetes mellitus: Secondary | ICD-10-CM | POA: Insufficient documentation

## 2020-09-21 DIAGNOSIS — Z880 Allergy status to penicillin: Secondary | ICD-10-CM | POA: Insufficient documentation

## 2020-09-21 HISTORY — PX: ESOPHAGOGASTRODUODENOSCOPY (EGD) WITH PROPOFOL: SHX5813

## 2020-09-21 HISTORY — PX: ENDOSCOPIC MUCOSAL RESECTION: SHX6839

## 2020-09-21 HISTORY — PX: SUBMUCOSAL LIFTING INJECTION: SHX6855

## 2020-09-21 HISTORY — PX: HEMOSTASIS CLIP PLACEMENT: SHX6857

## 2020-09-21 SURGERY — ESOPHAGOGASTRODUODENOSCOPY (EGD) WITH PROPOFOL
Anesthesia: Monitor Anesthesia Care

## 2020-09-21 MED ORDER — FENTANYL CITRATE (PF) 100 MCG/2ML IJ SOLN
INTRAMUSCULAR | Status: DC | PRN
  Administered 2020-09-21 (×4): 25 ug via INTRAVENOUS

## 2020-09-21 MED ORDER — SUGAMMADEX SODIUM 200 MG/2ML IV SOLN
INTRAVENOUS | Status: DC | PRN
  Administered 2020-09-21: 200 mg via INTRAVENOUS

## 2020-09-21 MED ORDER — SUCRALFATE 1 GM/10ML PO SUSP: 1.0000 g | Freq: Three times a day (TID) | ORAL | 1 refills | Status: DC

## 2020-09-21 MED ORDER — ONDANSETRON HCL 4 MG/2ML IJ SOLN
INTRAMUSCULAR | Status: DC | PRN
  Administered 2020-09-21: 4 mg via INTRAVENOUS

## 2020-09-21 MED ORDER — LACTATED RINGERS IV SOLN: INTRAVENOUS | Status: DC

## 2020-09-21 MED ORDER — MIDAZOLAM HCL 5 MG/5ML IJ SOLN
INTRAMUSCULAR | Status: DC | PRN
  Administered 2020-09-21: 1 mg via INTRAVENOUS

## 2020-09-21 MED ORDER — ASPIRIN 81 MG PO CHEW: 81.0000 mg | CHEWABLE_TABLET | Freq: Every day | ORAL | 0 refills | Status: AC

## 2020-09-21 MED ORDER — LIDOCAINE HCL 1 % IJ SOLN
INTRAMUSCULAR | Status: DC | PRN
  Administered 2020-09-21: 50 mg via INTRADERMAL

## 2020-09-21 MED ORDER — ROCURONIUM BROMIDE 100 MG/10ML IV SOLN
INTRAVENOUS | Status: DC | PRN
  Administered 2020-09-21: 60 mg via INTRAVENOUS

## 2020-09-21 MED ORDER — PROPOFOL 500 MG/50ML IV EMUL
INTRAVENOUS | Status: AC
  Filled 2020-09-21: qty 50

## 2020-09-21 MED ORDER — SODIUM CHLORIDE 0.9 % IV SOLN: INTRAVENOUS | Status: DC

## 2020-09-21 MED ORDER — MIDAZOLAM HCL 2 MG/2ML IJ SOLN
INTRAMUSCULAR | Status: AC
  Filled 2020-09-21: qty 2

## 2020-09-21 MED ORDER — FENTANYL CITRATE (PF) 100 MCG/2ML IJ SOLN
INTRAMUSCULAR | Status: AC
  Filled 2020-09-21: qty 2

## 2020-09-21 MED ORDER — LACTATED RINGERS IV SOLN
INTRAVENOUS | Status: AC | PRN
  Administered 2020-09-21: 1000 mL via INTRAVENOUS

## 2020-09-21 MED ORDER — PROPOFOL 10 MG/ML IV BOLUS
INTRAVENOUS | Status: DC | PRN
  Administered 2020-09-21: 150 mg via INTRAVENOUS

## 2020-09-21 SURGICAL SUPPLY — 15 items

## 2020-09-21 NOTE — Anesthesia Postprocedure Evaluation (Signed)
Anesthesia Post Note  Patient: Katherine Henson  Procedure(s) Performed: ESOPHAGOGASTRODUODENOSCOPY (EGD) WITH PROPOFOL ENDOSCOPIC MUCOSAL RESECTION SUBMUCOSAL LIFTING INJECTION HEMOSTASIS CLIP PLACEMENT     Patient location during evaluation: Endoscopy Anesthesia Type: General Level of consciousness: awake and alert Pain management: pain level controlled Vital Signs Assessment: post-procedure vital signs reviewed and stable Respiratory status: spontaneous breathing, nonlabored ventilation, respiratory function stable and patient connected to nasal cannula oxygen Cardiovascular status: blood pressure returned to baseline and stable Postop Assessment: no apparent nausea or vomiting Anesthetic complications: no   No notable events documented.  Last Vitals:  Vitals:   09/21/20 1341 09/21/20 1350  BP: 128/68 (!) 147/58  Pulse: 69 66  Resp: 17 13  Temp:    SpO2: 97% 97%    Last Pain:  Vitals:   09/21/20 1350  TempSrc:   PainSc: 0-No pain                 Catalina Gravel

## 2020-09-21 NOTE — Anesthesia Procedure Notes (Signed)
Procedure Name: Intubation Date/Time: 09/21/2020 12:14 PM Performed by: Garrel Ridgel, CRNA Pre-anesthesia Checklist: Patient identified, Emergency Drugs available, Suction available and Patient being monitored Patient Re-evaluated:Patient Re-evaluated prior to induction Oxygen Delivery Method: Circle system utilized Preoxygenation: Pre-oxygenation with 100% oxygen Induction Type: IV induction Ventilation: Mask ventilation without difficulty Laryngoscope Size: Mac and 3 Grade View: Grade II Tube type: Oral Tube size: 7.0 mm Number of attempts: 1 Airway Equipment and Method: Stylet and Oral airway Placement Confirmation: ETT inserted through vocal cords under direct vision, positive ETCO2 and breath sounds checked- equal and bilateral Secured at: 22 cm Tube secured with: Tape Dental Injury: Teeth and Oropharynx as per pre-operative assessment

## 2020-09-21 NOTE — H&P (Signed)
GASTROENTEROLOGY PROCEDURE H&P NOTE   Primary Care Physician: Nicolette Bang, MD  HPI: Katherine Henson is a 60 y.o. 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  Past Medical History:  Diagnosis Date   Allergy    Chronic hepatitis C ([place]) 12/06/2018   Depression    HTN (hypertension)    Hyperlipidemia    Stroke [facility])    Past Surgical History:  Procedure Laterality Date   NO PAST SURGERIES     Current Facility-Administered Medications  Medication Dose Route Frequency Provider Last Rate Last Admin   0.9 %  sodium chloride infusion   Intravenous Continuous Mansouraty, Telford Nab., MD       lactated ringers infusion   Intravenous Continuous Mansouraty, Telford Nab., MD       Allergies  Allergen Reactions   Penicillins Anaphylaxis    Did it involve swelling of the face/tongue/throat, SOB, or low BP? No Did it involve sudden or severe rash/hives, skin peeling, or any reaction on the inside of your mouth or nose? Yes Did you need to seek medical attention at a hospital or doctor's office? No When did it last happen?  30 years ago     If all above answers are "NO", may proceed with cephalosporin use.   Family History  Problem Relation Age of Onset   Stroke Mother    Anuerysm Mother        brain   Diabetes Sister    Breast cancer Sister    Diabetes Brother    Breast cancer Sister    Breast cancer Niece    Prostate cancer Brother    Colon cancer Neg Hx    Esophageal cancer Neg Hx    Liver disease Neg Hx    Stomach cancer Neg Hx    Pancreatic cancer Neg Hx    Inflammatory bowel disease Neg Hx    Rectal cancer Neg Hx    Social History   Socioeconomic History   Marital status: Single    Spouse name: Not on file   Number of children: 5   Years of education: Not on file   Highest education level: Not on file  Occupational History   Occupation: housekeeper  Tobacco Use   Smoking status: Former    Packs/day: 0.50     Years: 30.00    Pack years: 15.00    Types: Cigarettes    Quit date: 12/06/2010    Years since quitting: 9.8   Smokeless tobacco: Never  Vaping Use   Vaping Use: Never used  Substance and Sexual Activity   Alcohol use: Yes    Alcohol/week: 1.0 standard drink    Types: 1 Glasses of wine per week    Comment: occasional   Drug use: Not Currently    Types: Marijuana   Sexual activity: Not Currently  Other Topics Concern   Not on file  Social History Narrative   Left handed   One story home   No caffeine   Social Determinants of Health   Financial Resource Strain: Not on file  Food Insecurity: Not on file  Transportation Needs: Not on file  Physical Activity: Not on file  Stress: Not on file  Social Connections: Not on file  Intimate Partner Violence: Not on file    Physical Exam: Vital signs in last 24 hours: Temp:  [97.2 F (36.2 C)] 97.2 F (36.2 C) (07/18 1010) Resp:  [16] 16 (07/18 1010) BP: (151)/(93) 151/93 (07/18 1010) SpO2:  [  99 %] 99 % (07/18 1010) Weight:  [65.3 kg] 65.3 kg (07/18 1010)   GEN: NAD EYE: Sclerae anicteric ENT: MMM CV: Non-tachycardic GI: Soft, NT/ND NEURO:  Alert & Oriented x 3  Lab Results: No results for input(s): WBC, HGB, HCT, PLT in the last 72 hours. BMET No results for input(s): NA, K, CL, CO2, GLUCOSE, BUN, CREATININE, CALCIUM in the last 72 hours. LFT No results for input(s): PROT, ALBUMIN, AST, ALT, ALKPHOS, BILITOT, BILIDIR, IBILI in the last 72 hours. PT/INR No results for input(s): LABPROT, INR in the last 72 hours.   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  The risks of an EUS including intestinal perforation, bleeding, infection, aspiration, and medication effects were discussed as was the possibility it may not give a definitive diagnosis if a biopsy is performed.  When a biopsy of the pancreas is done as part of the EUS, there is an additional  risk of pancreatitis at the rate of about 1-2%.  It was explained that procedure related pancreatitis is typically mild, although it can be severe and even life threatening, which is why we do not perform random pancreatic biopsies and only biopsy a lesion/area we feel is concerning enough to warrant the risk.  The risks and benefits of endoscopic evaluation were discussed with the patient; these include but are not limited to the risk of perforation, infection, bleeding, missed lesions, lack of diagnosis, severe illness requiring hospitalization, as well as anesthesia and sedation related illnesses.  The patient is agreeable to proceed.    Justice Britain, MD [facility] Office # [phone]

## 2020-09-21 NOTE — Anesthesia Preprocedure Evaluation (Addendum)
Anesthesia Evaluation  Patient identified by MRN, date of birth, ID band Patient awake    Reviewed: Allergy & Precautions, NPO status , Patient's Chart, lab work & pertinent test results, reviewed documented beta blocker date and time   Airway Mallampati: II  TM Distance: >3 FB Neck ROM: Full    Dental  (+) Dental Advisory Given, Missing   Pulmonary Patient abstained from smoking., former smoker,    Pulmonary exam normal breath sounds clear to auscultation       Cardiovascular hypertension, Pt. on medications and Pt. on home beta blockers Normal cardiovascular exam Rhythm:Regular Rate:Normal     Neuro/Psych PSYCHIATRIC DISORDERS Depression CVA, No Residual Symptoms    GI/Hepatic negative GI ROS, (+) Hepatitis -, CDuodenal Adenoma Polyp   Endo/Other  negative endocrine ROS  Renal/GU negative Renal ROS     Musculoskeletal negative musculoskeletal ROS (+)   Abdominal   Peds  Hematology negative hematology ROS (+)   Anesthesia Other Findings Day of surgery medications reviewed with the patient.  Reproductive/Obstetrics                            Anesthesia Physical Anesthesia Plan  ASA: 3  Anesthesia Plan: General   Post-op Pain Management:    Induction: Intravenous  PONV Risk Score and Plan: 3 and Dexamethasone, Ondansetron and Treatment may vary due to age or medical condition  Airway Management Planned: Oral ETT  Additional Equipment:   Intra-op Plan:   Post-operative Plan: Extubation in OR  Informed Consent: I have reviewed the patients History and Physical, chart, labs and discussed the procedure including the risks, benefits and alternatives for the proposed anesthesia with the patient or authorized representative who has indicated his/her understanding and acceptance.     Dental advisory given  Plan Discussed with: CRNA and Anesthesiologist  Anesthesia Plan Comments:         Anesthesia Quick Evaluation

## 2020-09-21 NOTE — Transfer of Care (Signed)
Immediate Anesthesia Transfer of Care Note  Patient: Nyazia O [place]  Procedure(s) Performed: ESOPHAGOGASTRODUODENOSCOPY (EGD) WITH PROPOFOL ENDOSCOPIC MUCOSAL RESECTION SUBMUCOSAL LIFTING INJECTION HEMOSTASIS CLIP PLACEMENT  Patient Location: PACU  Anesthesia Type:General  Level of Consciousness: awake, alert  and oriented  Airway & Oxygen Therapy: Patient Spontanous Breathing and Patient connected to face mask oxygen  Post-op Assessment: Report given to RN  Post vital signs: Reviewed and stable  Last Vitals:  Vitals Value Taken Time  BP 139/70 09/21/20 1324  Temp    Pulse 68 09/21/20 1325  Resp 14 09/21/20 1325  SpO2 88 % 09/21/20 1325  Vitals shown include unvalidated device data.  Last Pain:  Vitals:   09/21/20 1010  TempSrc: Temporal  PainSc: 0-No pain         Complications: No notable events documented.

## 2020-09-21 NOTE — Discharge Instructions (Signed)
YOU HAD AN ENDOSCOPIC PROCEDURE TODAY: Refer to the procedure report and other information in the discharge instructions given to you for any specific questions about what was found during the examination. If this information does not answer your questions, please call [place] office at [phone] to clarify.  ° °YOU SHOULD EXPECT: Some feelings of bloating in the abdomen. Passage of more gas than usual. Walking can help get rid of the air that was put into your GI tract during the procedure and reduce the bloating. If you had a lower endoscopy (such as a colonoscopy or flexible sigmoidoscopy) you may notice spotting of blood in your stool or on the toilet paper. Some abdominal soreness may be present for a day or two, also. ° °DIET: Your first meal following the procedure should be a light meal and then it is ok to progress to your normal diet. A half-sandwich or bowl of soup is an example of a good first meal. Heavy or fried foods are harder to digest and may make you feel nauseous or bloated. Drink plenty of fluids but you should avoid alcoholic beverages for 24 hours. If you had a esophageal dilation, please see attached instructions for diet.   ° °ACTIVITY: Your care partner should take you home directly after the procedure. You should plan to take it easy, moving slowly for the rest of the day. You can resume normal activity the day after the procedure however YOU SHOULD NOT DRIVE, use power tools, machinery or perform tasks that involve climbing or major physical exertion for 24 hours (because of the sedation medicines used during the test).  ° °SYMPTOMS TO REPORT IMMEDIATELY: °A gastroenterologist can be reached at any hour. Please call [phone]  for any of the following symptoms:  °Following lower endoscopy (colonoscopy, flexible sigmoidoscopy) °Excessive amounts of blood in the stool  °Significant tenderness, worsening of abdominal pains  °Swelling of the abdomen that is new, acute  °Fever of 100° or  higher  °Following upper endoscopy (EGD, EUS, ERCP, esophageal dilation) °Vomiting of blood or coffee ground material  °New, significant abdominal pain  °New, significant chest pain or pain under the shoulder blades  °Painful or persistently difficult swallowing  °New shortness of breath  °Black, tarry-looking or red, bloody stools ° °FOLLOW UP:  °If any biopsies were taken you will be contacted by phone or by letter within the next 1-3 weeks. Call [phone]  if you have not heard about the biopsies in 3 weeks.  °Please also call with any specific questions about appointments or follow up tests. ° °

## 2020-09-21 NOTE — Op Note (Signed)
[facility] Patient Name: Katherine Henson Procedure Date: 09/21/2020 MRN: [id number] Attending MD: Justice Britain , MD Date of Birth: August 30, 1960 CSN: [id number] Age: 61 Admit Type: Outpatient Procedure:                Upper GI endoscopy Indications:              Therapeutic procedure, Polyps in the duodenum, For                            therapy of polyps in the duodenum Providers:                Justice Britain, MD, Baird Cancer, RN, Ladona Ridgel, Technician, Karis Juba, CRNA Referring MD:             Thornton Park MD, MD, Nicolette Bang Medicines:                General Anesthesia Complications:            No immediate complications. Estimated Blood Loss:     Estimated blood loss was minimal. Procedure:                Pre-Anesthesia Assessment:                           - Prior to the procedure, a History and Physical                            was performed, and patient medications and                            allergies were reviewed. The patient's tolerance of                            previous anesthesia was also reviewed. The risks                            and benefits of the procedure and the sedation                            options and risks were discussed with the patient.                            All questions were answered, and informed consent                            was obtained. Prior Anticoagulants: The patient has                            taken no previous anticoagulant or antiplatelet                            agents except for aspirin. ASA Grade Assessment:  III - A patient with severe systemic disease. After                            reviewing the risks and benefits, the patient was                            deemed in satisfactory condition to undergo the                            procedure.                           After obtaining informed consent,  the endoscope was                            passed under direct vision. Throughout the                            procedure, the patient's blood pressure, pulse, and                            oxygen saturations were monitored continuously. The                            [id number] ([id number]) Olympus therapeutic endoscope                            was introduced through the mouth, and advanced to                            the third part of duodenum. The upper GI endoscopy                            was somewhat difficult due to an endoscope                            malfunction. Successful completion of the procedure                            was aided by performing the maneuvers documented                            (below) in this report. The patient tolerated the                            procedure. Scope In: Scope Out: Findings:      No gross lesions were noted in the entire esophagus.      The Z-line was irregular and was found 36 cm from the incisors.      Patchy mildly erythematous mucosa without bleeding was found in the       entire examined stomach - previously biopsied.      The major papilla was normal.      A single 35 mm semi-sessile polyp with no bleeding was found in the         second portion of the duodenum distal and on the contralateral wall to       ampulla. Preparations were made for mucosal resection. NBI imaging and       White-light endoscopy was done to demarcate the borders of the lesion.       Orise gel was injected to raise the lesion. Piecemeal mucosal resection       using a snare was performed. Resection and retrieval were complete. To       prevent bleeding after mucosal resection, twelve hemostatic clips were       successfully placed (MR conditional). During the middle of the closure,       I had one [employer] 360 clip placed adequately and closed but       subsequently did not release. We adjusted scope positioning into       short/reduced  positioning and even this did not allow the clip to       release. I then tried to manipulate the clip within the scope to allow       it to release and this did not occur. After more than 5 minutes of being       unable to release the clip safely, I tried to pull the clip (though I       had concern I could cause more bleeding or potentially cause a defect to       become larger, but there was not another means of release. While pulling       the clip and tissue, I was able to have final release of the clip. I       looked at the area completely, and other than increased bleeding for a       period in time, I did not appreciate a deeper tissue defect. I proceeded       with completion of the closure. There was no bleeding at the end of the       procedure.      No other gross lesions were noted in the duodenal bulb, in the first       portion of the duodenum and in the second portion of the duodenum and in       the third portion of the duodenum. Impression:               - No gross lesions in esophagus. Z-line irregular,                            36 cm from the incisors.                           - Erythematous mucosa in the stomach - previously                            biopsied.                           - Normal major papilla.                           - A single duodenal polyp distal and on                              contralateral wall to ampulla. Resected and                            retrieved via piecemeal cold resection. As noted                            above, clips (MR conditional) were placed but had                            issue with one of the clips not formally releasing                            - requiring technique noted above to remove; I did                            not feel there was any complication from this clip                            but certainly concerning during the procedure.                           - No other gross lesions in the duodenal  bulb, in                            the first portion of the duodenum and in the second                            portion of the duodenum and third portion of the                            duodenum. Moderate Sedation:      Not Applicable - Patient had care per Anesthesia. Recommendation:           - The patient will be observed post-procedure,                            until all discharge criteria are met and if doing                            well then will discharge.                           - Discharge patient to home.                           - Patient has a contact number available for                            emergencies. The signs and symptoms of potential                            delayed complications were discussed with the  patient. Return to normal activities tomorrow.                            Written discharge instructions were provided to the                            patient.                           - Clear liquid diet today. and advance to soft diet                            tomorrow.                           - No aspirin, ibuprofen, naproxen, or other                            non-steroidal anti-inflammatory drugs for 2 weeks                            after polyp removal.                           - Continue Protonix 40 mg twice daily for at least                            1 month.                           - Start Carafate 1 g with each meal and at bedtime                            to aid in healing process.                           - Observe patient's clinical course.                           - Monitor for signs/symptoms of bleeding,                            perforation, and infection. If issues please call                            our number to get further assistance as needed.                           - Await pathology results.                           - Repeat upper endoscopy in 6-9 months for                             surveillance as long as no evidence of malignancy                              is found.                           - The findings and recommendations were discussed                            with the patient.                           - The findings and recommendations were discussed                            with the patient's family. Procedure Code(s):        --- Professional ---                           [phone], Esophagogastroduodenoscopy, flexible,                            transoral; with endoscopic mucosal resection Diagnosis Code(s):        --- Professional ---                           K22.8, Other specified diseases of esophagus                           K31.89, Other diseases of stomach and duodenum                           K31.7, Polyp of stomach and duodenum CPT copyright 2019 [employer]. All rights reserved. The codes documented in this report are preliminary and upon coder review may  be revised to meet current compliance requirements. Justice Britain, MD 09/21/2020 1:28:52 PM Number of Addenda: 0

## 2020-09-22 ENCOUNTER — Encounter (HOSPITAL_COMMUNITY): Payer: Self-pay | Admitting: Gastroenterology

## 2020-09-22 ENCOUNTER — Encounter: Payer: Self-pay | Admitting: Gastroenterology

## 2020-09-22 LAB — SURGICAL PATHOLOGY

## 2020-09-23 ENCOUNTER — Other Ambulatory Visit: Payer: Self-pay

## 2020-09-23 ENCOUNTER — Encounter (HOSPITAL_COMMUNITY): Payer: Self-pay

## 2020-09-23 ENCOUNTER — Ambulatory Visit (INDEPENDENT_AMBULATORY_CARE_PROVIDER_SITE_OTHER): Payer: Self-pay

## 2020-09-23 ENCOUNTER — Ambulatory Visit (HOSPITAL_COMMUNITY)
Admission: EM | Admit: 2020-09-23 | Discharge: 2020-09-23 | Disposition: A | Discharge status: Home / Self Care | Payer: Self-pay | Attending: Physician Assistant | Admitting: Physician Assistant

## 2020-09-23 DIAGNOSIS — M79641 Pain in right hand: Secondary | ICD-10-CM

## 2020-09-23 DIAGNOSIS — R200 Anesthesia of skin: Secondary | ICD-10-CM

## 2020-09-23 DIAGNOSIS — R2231 Localized swelling, mass and lump, right upper limb: Secondary | ICD-10-CM

## 2020-09-23 NOTE — ED Provider Notes (Signed)
[place]    CSN: [id number] Arrival date & time: 09/23/20  1902      History   Chief Complaint Chief Complaint  Patient presents with   Hand Problem    HPI Katherine Henson is a 60 y.o. female.   Patient presents today with a 1 day history of right hand pain.  Symptoms began after having multiple failed attempts at IV placement on 09/21/2020 in preparation for EGD procedure.  She did have attempted IV placed in her dorsal right hand and has had some pain as well as numbness throughout her right arm since that time.  Patient is left-handed and able to perform daily activities despite symptoms.  Her primary concern today is a small movable object in her right hand.  She has not tried any over-the-counter medication for symptom management.  She is concerned that there is a foreign body present prompting evaluation today.   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   Helicobacter pylori infection 07/22/2020   Esophagitis determined by endoscopy 07/22/2020   History of cerebellar stroke 11/26/2019   Mixed hyperlipidemia 11/26/2019   Positive double stranded DNA antibody test 04/10/2019   Chronic hepatitis C without hepatic coma ([place]) 12/06/2018   HTN (hypertension) 04/30/2018    Past Surgical History:  Procedure Laterality Date   ENDOSCOPIC MUCOSAL RESECTION  09/21/2020   Procedure: ENDOSCOPIC MUCOSAL RESECTION;  Surgeon: Irving Copas., MD;  Location: [facility];  Service: Gastroenterology;;   ESOPHAGOGASTRODUODENOSCOPY (EGD) WITH PROPOFOL N/A 09/21/2020   Procedure: ESOPHAGOGASTRODUODENOSCOPY (EGD) WITH PROPOFOL;  Surgeon: Irving Copas., MD;   Location: [facility];  Service: Gastroenterology;  Laterality: N/A;   HEMOSTASIS CLIP PLACEMENT  09/21/2020   Procedure: HEMOSTASIS CLIP PLACEMENT;  Surgeon: Rush Landmark Telford Nab., MD;  Location: [facility];  Service: Gastroenterology;;   NO PAST SURGERIES     SUBMUCOSAL LIFTING INJECTION  09/21/2020   Procedure: SUBMUCOSAL LIFTING INJECTION;  Surgeon: Irving Copas., MD;  Location: [facility];  Service: Gastroenterology;;    OB History   No obstetric history on file.      Home Medications    Prior to Admission medications   Medication Sig Start Date End Date Taking? Authorizing Provider  amLODipine (NORVASC) 10 MG tablet Take 1 tablet (10 mg total) by mouth daily. 09/11/20 12/10/20  Camillia Herter, NP  aspirin 81 MG chewable tablet Chew 1 tablet (81 mg total) by mouth daily. 10/05/20   Mansouraty, Telford Nab., MD  cyanocobalamin 2000 MCG tablet Take 2,000 mcg by mouth daily.    [provider]  ferrous sulfate 325 (65 FE) MG tablet Take 325 mg by mouth daily with breakfast.    [provider]  losartan (COZAAR) 50 MG tablet Take 1 tablet (50 mg total) by mouth daily. 09/11/20 12/10/20  Camillia Herter, NP  metoprolol tartrate (LOPRESSOR) 25 MG tablet Take 1/2 (one-half) tablet by mouth twice daily Patient taking differently: Take 12.5 mg by mouth 2 (two) times daily. 09/11/20   Camillia Herter, NP  pantoprazole (PROTONIX) 40 MG tablet Take 1 tablet (40 mg total) by mouth 2 (two) times daily. 06/05/20   Thornton Park,  MD  simvastatin (ZOCOR) 40 MG tablet Take 1 tablet (40 mg total) by mouth daily at 6 PM. 02/24/20   Nicolette Bang, MD  sucralfate (CARAFATE) 1 GM/10ML suspension Take 10 mLs (1 g total) by mouth 4 (four) times daily -  with meals and at bedtime. 09/21/20 09/21/21  Mansouraty, Telford Nab., MD    Family History Family History  Problem Relation Age of Onset   Stroke Mother    Anuerysm Mother        brain   Diabetes Sister    Breast  cancer Sister    Diabetes Brother    Breast cancer Sister    Breast cancer Niece    Prostate cancer Brother    Colon cancer Neg Hx    Esophageal cancer Neg Hx    Liver disease Neg Hx    Stomach cancer Neg Hx    Pancreatic cancer Neg Hx    Inflammatory bowel disease Neg Hx    Rectal cancer Neg Hx     Social History Social History   Tobacco Use   Smoking status: Former    Packs/day: 0.50    Years: 30.00    Pack years: 15.00    Types: Cigarettes    Quit date: 12/06/2010    Years since quitting: 9.8   Smokeless tobacco: Never  Vaping Use   Vaping Use: Never used  Substance Use Topics   Alcohol use: Yes    Alcohol/week: 1.0 standard drink    Types: 1 Glasses of wine per week    Comment: occasional   Drug use: Not Currently    Types: Marijuana     Allergies   Penicillins   Review of Systems Review of Systems  Constitutional:  Positive for activity change. Negative for appetite change, fatigue and fever.  Respiratory:  Negative for cough and shortness of breath.   Cardiovascular:  Negative for chest pain.  Gastrointestinal:  Negative for abdominal pain, diarrhea, nausea and vomiting.  Musculoskeletal:  Positive for arthralgias. Negative for myalgias.  Neurological:  Positive for numbness. Negative for dizziness, weakness, light-headedness and headaches.    Physical Exam Triage Vital Signs ED Triage Vitals  Enc Vitals Group     BP 09/23/20 1956 (!) 138/93     Pulse Rate 09/23/20 1956 87     Resp 09/23/20 1956 16     Temp 09/23/20 1956 98 F (36.7 C)     Temp Source 09/23/20 1956 Oral     SpO2 09/23/20 1956 97 %     Weight --      Height --      Head Circumference --      Peak Flow --      Pain Score 09/23/20 1957 0     Pain Loc --      Pain Edu? --      Excl. in [place]? --    No data found.  Updated Vital Signs BP (!) 138/93 (BP Location: Right Arm)   Pulse 87   Temp 98 F (36.7 C) (Oral)   Resp 16   LMP 09/27/2012   SpO2 97%   Visual  Acuity Right Eye Distance:   Left Eye Distance:   Bilateral Distance:    Right Eye Near:   Left Eye Near:    Bilateral Near:     Physical Exam Vitals reviewed.  Constitutional:      General: She is awake. She is not in acute distress.    Appearance: Normal appearance. She is  normal weight. She is not ill-appearing.     Comments: Very pleasant female appears stated age in no acute distress sitting comfortably in exam room  HENT:     Head: Normocephalic and atraumatic.  Cardiovascular:     Rate and Rhythm: Normal rate and regular rhythm.     Pulses:          Radial pulses are 2+ on the right side and 2+ on the left side.     Heart sounds: Normal heart sounds, S1 normal and S2 normal. No murmur heard.    Comments: Capillary fill within 2 seconds bilateral hands Pulmonary:     Effort: Pulmonary effort is normal.     Breath sounds: Normal breath sounds. No wheezing, rhonchi or rales.     Comments: Clear to auscultation bilaterally Abdominal:     Palpations: Abdomen is soft.     Tenderness: There is no abdominal tenderness.  Musculoskeletal:     Right hand: No tenderness. Normal strength. Normal sensation. There is no disruption of two-point discrimination. Normal capillary refill.     Left hand: No tenderness. Normal strength. Normal sensation. There is no disruption of two-point discrimination. Normal capillary refill.     Comments: Right hand: Mild tenderness to palpation over dorsal right hand.  Subjective tenderness to palpation but hand neurovascularly intact with normal two-point discrimination.  Normal pincer grip strength bilaterally.  5 mm circular object with well-defined borders that is movable throughout dorsal right hand.  Psychiatric:        Behavior: Behavior is cooperative.     UC Treatments / Results  Labs (all labs ordered are listed, but only abnormal results are displayed) Labs Reviewed - No data to display  EKG   Radiology DG Hand Complete  Right  Result Date: 09/23/2020 CLINICAL DATA:  Retained foreign body EXAM: RIGHT HAND - COMPLETE 3+ VIEW COMPARISON:  None. FINDINGS: There is no evidence of fracture or dislocation. There is no evidence of arthropathy or other focal bone abnormality. Soft tissues are unremarkable. No retained radiopaque foreign body is identified. IMPRESSION: Negative. Electronically Signed   By: Fidela Salisbury MD   On: 09/23/2020 21:13    Procedures Procedures (including critical care time)  Medications Ordered in UC Medications - No data to display  Initial Impression / Assessment and Plan / UC Course  I have reviewed the triage vital signs and the nursing notes.  Pertinent labs & imaging results that were available during my care of the patient were reviewed by me and considered in my medical decision making (see chart for details).      X-ray obtained showed no acute abnormality.  Discussed with patient that area is likely benign given it is well-defined and freely mobile.  We would ultimately need ultrasound or more advanced imaging which is not something we can arrange in urgent care.  Encouraged her to use over-the-counter analgesics for associated pain.  Recommend she follow-up with her primary care provider to consider additional imaging should symptoms persist.  Discussed alarm symptoms that warrant emergent evaluation.  Strict return precautions given to which patient expressed understanding.  Final Clinical Impressions(s) / UC Diagnoses   Final diagnoses:  Right hand pain  Numbness of right hand  Mass of skin of right hand     Discharge Instructions      Your x-ray was normal so we likely need to get an ultrasound.  This is typically arranged through your primary care provider.  Please use over-the-counter medications such  as Tylenol and ibuprofen for pain relief and follow-up with your primary care provider for further evaluation.  If anything suddenly worsens please return for  reevaluation.     ED Prescriptions   None    PDMP not reviewed this encounter.   Terrilee Croak, PA-C 09/23/20 2120

## 2020-09-23 NOTE — ED Triage Notes (Signed)
Pt reports having surgery done on Monday. states she has been feeling hand numbness x 1 day. States she sees something moving in her hand.

## 2020-09-23 NOTE — Discharge Instructions (Signed)
Your x-ray was normal so we likely need to get an ultrasound.  This is typically arranged through your primary care provider.  Please use over-the-counter medications such as Tylenol and ibuprofen for pain relief and follow-up with your primary care provider for further evaluation.  If anything suddenly worsens please return for reevaluation.

## 2020-09-24 ENCOUNTER — Telehealth: Payer: Self-pay | Admitting: Family

## 2020-09-24 NOTE — Telephone Encounter (Signed)
Pt was just seen on 09/11/20 but is calling to ask for PCP to assist w/ arranging an [place] per ED's notes on 09/23/20 to seek PCP assistance. Pt had imaging done but needs further studies done.

## 2020-09-25 NOTE — Progress Notes (Signed)
Recall entered for 6 months for Dr. Rush Landmark

## 2020-09-30 ENCOUNTER — Ambulatory Visit: Payer: Medicaid Other | Admitting: Physician Assistant

## 2020-10-01 ENCOUNTER — Telehealth: Payer: Self-pay | Admitting: *Deleted

## 2020-10-01 ENCOUNTER — Encounter: Payer: No Typology Code available for payment source | Admitting: Obstetrics and Gynecology

## 2020-10-01 ENCOUNTER — Ambulatory Visit (HOSPITAL_COMMUNITY)
Admission: RE | Admit: 2020-10-01 | Discharge: 2020-10-01 | Disposition: A | Discharge status: Home / Self Care | Payer: Self-pay | Source: Ambulatory Visit | Attending: Physician Assistant | Admitting: Physician Assistant

## 2020-10-01 ENCOUNTER — Other Ambulatory Visit: Payer: Self-pay

## 2020-10-01 ENCOUNTER — Ambulatory Visit (INDEPENDENT_AMBULATORY_CARE_PROVIDER_SITE_OTHER): Payer: Self-pay | Admitting: Physician Assistant

## 2020-10-01 VITALS — BP 130/76 | HR 84 | Temp 98.2°F | Resp 18 | Ht 60.0 in | Wt 145.0 lb

## 2020-10-01 DIAGNOSIS — M7989 Other specified soft tissue disorders: Secondary | ICD-10-CM

## 2020-10-01 DIAGNOSIS — M79641 Pain in right hand: Secondary | ICD-10-CM

## 2020-10-01 DIAGNOSIS — R2231 Localized swelling, mass and lump, right upper limb: Secondary | ICD-10-CM | POA: Insufficient documentation

## 2020-10-01 DIAGNOSIS — M79631 Pain in right forearm: Secondary | ICD-10-CM | POA: Insufficient documentation

## 2020-10-01 NOTE — Telephone Encounter (Signed)
Patient verified DOB Patient is aware of no DVT being noted on the [place] and was advised to continue care using tylenol for pain and ice for swelling. Patient advised to follow up if concern worsens or does not resolve next week.

## 2020-10-01 NOTE — Progress Notes (Signed)
Established Patient Office Visit  Subjective:  Patient ID: Katherine Henson, female    DOB: January 02, 1961  Age: 60 y.o. MRN: [id number]  CC:  Chief Complaint  Patient presents with   Arm Swelling    right    HPI Katherine Henson reports that she had surgery on July 18 to remove a polyp from her small intestine.  Reports that there was difficulty placing that IV in her hand.  Reports that since then she has had a palpable mass on the top of her right hand, states that has become tender to the touch.  Reports that she started having swelling in her right forearm 2 days ago, states that she is now having numbness on her hand and in her fingers.  Has not tried anything for relief.  States that she was seen at urgent care on July 20 for same complaint.  Urgent care note:    X-ray obtained showed no acute abnormality.  Discussed with patient that area is likely benign given it is well-defined and freely mobile.  We would ultimately need ultrasound or more advanced imaging which is not something we can arrange in urgent care.  Encouraged her to use over-the-counter analgesics for associated pain.  Recommend she follow-up with her primary care provider to consider additional imaging should symptoms persist.  Discussed alarm symptoms that warrant emergent evaluation.  Strict return precautions given to which patient expressed understanding.      Past Medical History:  Diagnosis Date   Allergy    Chronic hepatitis C ([place]) 12/06/2018   Depression    HTN (hypertension)    Hyperlipidemia    Stroke [facility])     Past Surgical History:  Procedure Laterality Date   ENDOSCOPIC MUCOSAL RESECTION  09/21/2020   Procedure: ENDOSCOPIC MUCOSAL RESECTION;  Surgeon: Irving Copas., MD;  Location: [facility];  Service: Gastroenterology;;   ESOPHAGOGASTRODUODENOSCOPY (EGD) WITH PROPOFOL N/A 09/21/2020   Procedure: ESOPHAGOGASTRODUODENOSCOPY (EGD) WITH PROPOFOL;  Surgeon: Irving Copas., MD;   Location: [facility];  Service: Gastroenterology;  Laterality: N/A;   HEMOSTASIS CLIP PLACEMENT  09/21/2020   Procedure: HEMOSTASIS CLIP PLACEMENT;  Surgeon: Rush Landmark Telford Nab., MD;  Location: [facility];  Service: Gastroenterology;;   NO PAST SURGERIES     SUBMUCOSAL LIFTING INJECTION  09/21/2020   Procedure: SUBMUCOSAL LIFTING INJECTION;  Surgeon: Irving Copas., MD;  Location: [facility];  Service: Gastroenterology;;    Family History  Problem Relation Age of Onset   Stroke Mother    Anuerysm Mother        brain   Diabetes Sister    Breast cancer Sister    Diabetes Brother    Breast cancer Sister    Breast cancer Niece    Prostate cancer Brother    Colon cancer Neg Hx    Esophageal cancer Neg Hx    Liver disease Neg Hx    Stomach cancer Neg Hx    Pancreatic cancer Neg Hx    Inflammatory bowel disease Neg Hx    Rectal cancer Neg Hx     Social History   Socioeconomic History   Marital status: Single    Spouse name: Not on file   Number of children: 5   Years of education: Not on file   Highest education level: Not on file  Occupational History   Occupation: housekeeper  Tobacco Use   Smoking status: Former    Packs/day: 0.50    Years: 30.00    Pack years: 15.00  Types: Cigarettes    Quit date: 12/06/2010    Years since quitting: 9.8   Smokeless tobacco: Never  Vaping Use   Vaping Use: Never used  Substance and Sexual Activity   Alcohol use: Yes    Alcohol/week: 1.0 standard drink    Types: 1 Glasses of wine per week    Comment: occasional   Drug use: Not Currently    Types: Marijuana   Sexual activity: Not Currently  Other Topics Concern   Not on file  Social History Narrative   Left handed   One story home   No caffeine   Social Determinants of Health   Financial Resource Strain: Not on file  Food Insecurity: Not on file  Transportation Needs: Not on file  Physical Activity: Not on file  Stress: Not on file  Social  Connections: Not on file  Intimate Partner Violence: Not on file    Outpatient Medications Prior to Visit  Medication Sig Dispense Refill   amLODipine (NORVASC) 10 MG tablet Take 1 tablet (10 mg total) by mouth daily. 90 tablet 0   [START ON 10/05/2020] aspirin 81 MG chewable tablet Chew 1 tablet (81 mg total) by mouth daily. 30 tablet 0   cyanocobalamin 2000 MCG tablet Take 2,000 mcg by mouth daily.     ferrous sulfate 325 (65 FE) MG tablet Take 325 mg by mouth daily with breakfast.     losartan (COZAAR) 50 MG tablet Take 1 tablet (50 mg total) by mouth daily. 90 tablet 0   metoprolol tartrate (LOPRESSOR) 25 MG tablet Take 1/2 (one-half) tablet by mouth twice daily (Patient taking differently: Take 12.5 mg by mouth 2 (two) times daily.) 30 tablet 2   pantoprazole (PROTONIX) 40 MG tablet Take 1 tablet (40 mg total) by mouth 2 (two) times daily. 90 tablet 3   simvastatin (ZOCOR) 40 MG tablet Take 1 tablet (40 mg total) by mouth daily at 6 PM. 90 tablet 1   sucralfate (CARAFATE) 1 GM/10ML suspension Take 10 mLs (1 g total) by mouth 4 (four) times daily -  with meals and at bedtime. 420 mL 1   No facility-administered medications prior to visit.    Allergies  Allergen Reactions   Penicillins Anaphylaxis    Did it involve swelling of the face/tongue/throat, SOB, or low BP? No Did it involve sudden or severe rash/hives, skin peeling, or any reaction on the inside of your mouth or nose? Yes Did you need to seek medical attention at a hospital or doctor's office? No When did it last happen?  30 years ago     If all above answers are "NO", may proceed with cephalosporin use.    ROS Review of Systems  Constitutional:  Negative for chills and fever.  HENT: Negative.    Eyes: Negative.   Respiratory:  Negative for shortness of breath.   Cardiovascular:  Negative for chest pain.  Gastrointestinal: Negative.   Endocrine: Negative.   Genitourinary: Negative.   Musculoskeletal: Negative.    Skin:  Negative for color change.  Allergic/Immunologic: Negative.   Neurological: Negative.   Hematological: Negative.   Psychiatric/Behavioral: Negative.       Objective:    Physical Exam Vitals and nursing note reviewed.  Constitutional:      General: She is not in acute distress.    Appearance: Normal appearance.  HENT:     Head: Normocephalic and atraumatic.     Right Ear: External ear normal.     Left Ear: External  ear normal.     Nose: Nose normal.     Mouth/Throat:     Mouth: Mucous membranes are moist.     Pharynx: Oropharynx is clear.  Eyes:     Extraocular Movements: Extraocular movements intact.     Conjunctiva/sclera: Conjunctivae normal.     Pupils: Pupils are equal, round, and reactive to light.  Cardiovascular:     Rate and Rhythm: Normal rate and regular rhythm.     Pulses: Normal pulses.     Heart sounds: Normal heart sounds.  Pulmonary:     Effort: Pulmonary effort is normal.     Breath sounds: Normal breath sounds.  Musculoskeletal:        General: Normal range of motion.     Cervical back: Normal range of motion and neck supple.     Comments: See photo - warm to touch  Skin:    General: Skin is warm and dry.  Neurological:     General: No focal deficit present.     Mental Status: She is alert and oriented to person, place, and time.  Psychiatric:        Mood and Affect: Mood normal.        Behavior: Behavior normal.        Thought Content: Thought content normal.        Judgment: Judgment normal.        LMP 09/27/2012  Wt Readings from Last 3 Encounters:  09/21/20 144 lb (65.3 kg)  09/11/20 144 lb (65.3 kg)  07/21/20 147 lb 12.8 oz (67 kg)     Health Maintenance Due  Topic Date Due   Zoster Vaccines- Shingrix (1 of 2) Never done   COLON CANCER SCREENING ANNUAL FOBT  03/18/2020   COVID-19 Vaccine (4 - Booster for Pfizer series) 06/17/2020    There are no preventive care reminders to display for this patient.  Lab Results   Component Value Date   TSH 2.470 04/03/2019   Lab Results  Component Value Date   WBC 7.5 07/21/2020   HGB 11.8 (L) 07/21/2020   HCT 35.8 (L) 07/21/2020   MCV 83.2 07/21/2020   PLT 305.0 07/21/2020   Lab Results  Component Value Date   NA 143 07/21/2020   K 3.7 07/21/2020   CO2 26 07/21/2020   GLUCOSE 95 07/21/2020   BUN 12 07/21/2020   CREATININE 0.88 07/21/2020   BILITOT 0.5 06/05/2020   ALKPHOS 79 06/05/2020   AST 16 06/05/2020   ALT 13 06/05/2020   PROT 7.6 06/05/2020   ALBUMIN 4.2 06/05/2020   CALCIUM 9.9 07/21/2020   ANIONGAP 9 11/13/2019   GFR 71.33 07/21/2020   Lab Results  Component Value Date   CHOL 194 11/26/2019   Lab Results  Component Value Date   HDL 54 11/26/2019   Lab Results  Component Value Date   LDLCALC 124 (H) 11/26/2019   Lab Results  Component Value Date   TRIG 87 11/26/2019   Lab Results  Component Value Date   CHOLHDL 3.6 11/26/2019   Lab Results  Component Value Date   HGBA1C 6.1 (H) 12/06/2018      Assessment & Plan:   Problem List Items Addressed This Visit   None Visit Diagnoses     Subcutaneous mass of right hand    -  Primary   Relevant Orders   VAS [place] UPPER EXTREMITY VENOUS DUPLEX (Completed)   Right hand pain       Relevant Orders  VAS [place] UPPER EXTREMITY VENOUS DUPLEX (Completed)   Pain and swelling of right forearm       Relevant Orders   VAS [place] UPPER EXTREMITY VENOUS DUPLEX (Completed)       No orders of the defined types were placed in this encounter. 1. Subcutaneous mass of right hand Patient education given on supportive care.  Red flags given for prompt reevaluation.  Patient sent for stat imaging. - VAS [place] UPPER EXTREMITY VENOUS DUPLEX; Future  2. Right hand pain  - VAS [place] UPPER EXTREMITY VENOUS DUPLEX; Future  3. Pain and swelling of right forearm  - VAS [place] UPPER EXTREMITY VENOUS DUPLEX; Future   I have reviewed the patient's medical history (PMH, PSH, Social History, Family History,  Medications, and allergies) , and have been updated if relevant. I spent 32 minutes reviewing chart and  face to face time with patient.   Follow-up: Return if symptoms worsen or fail to improve.    Loraine Grip Mayers, PA-C

## 2020-10-01 NOTE — Progress Notes (Signed)
Patient noticed swelling from 09/23/20 with increased Wednesday, patient reports limiting use and extreme swelling. Patient has eaten and taken medication today.

## 2020-10-01 NOTE — Telephone Encounter (Signed)
-----   Message from Kennieth Rad, [place] sent at 10/01/2020  3:22 PM EDT ----- Please call patient and let her know that her ultrasound did not show any signs of a deep vein thrombosis.  I encourage her to take Tylenol to help with the pain and use ice to help with the swelling.

## 2020-10-01 NOTE — Progress Notes (Signed)
Upper extremity venous has been completed.   Preliminary results in CV Proc.   Abram Sander 10/01/2020 3:14 PM

## 2020-10-01 NOTE — Patient Instructions (Signed)
We will call you with the results of your ultrasound as soon as they are available.  I encourage you to use Tylenol to help with the pain, and icing as well.  Kennieth Rad, PA-C Physician Assistant [facility] [email or web address]   Hand Pain Many things can cause hand pain. Some common causes are: An injury. Repeating the same movement with your hand over and over (overuse). Osteoporosis. Arthritis. Lumps in the tendons or joints of the hand and wrist (ganglion cysts). Nerve compression syndromes (carpal tunnel syndrome). Inflammation of the tendons (tendinitis). Infection. Follow these instructions at home: Pay attention to any changes in your symptoms. Take these actions to help withyour discomfort: Managing pain, stiffness, and swelling  Take over-the-counter and prescription medicines only as told by your health care provider. Wear a hand splint or support as told by your health care provider. If directed, put ice on the affected area: Put ice in a plastic bag. Place a towel between your skin and the bag. Leave the ice on for 20 minutes, 2-3 times a day.  Activity Take breaks from repetitive activity often. Avoid activities that make your pain worse. Minimize stress on your hands and wrists as much as possible. Do stretches or exercises as told by your health care provider. Do not do activities that make your pain worse. Contact a health care provider if: Your pain does not get better after a few days of self-care. Your pain gets worse. Your pain affects your ability to do your daily activities. Get help right away if: Your hand becomes warm, red, or swollen. Your hand is numb or tingling. Your hand is extremely swollen or deformed. Your hand or fingers turn white or blue. You cannot move your hand, wrist, or fingers. Summary Many things can cause hand pain. Contact your health care provider if your pain  does not get better after a few days of self care. Minimize stress on your hands and wrists as much as possible. Do not do activities that make your pain worse. This information is not intended to replace advice given to you by your health care provider. Make sure you discuss any questions you have with your healthcare provider. Document Revised: 11/17/2017 Document Reviewed: 11/17/2017 Elsevier Patient Education  [place].

## 2020-10-02 ENCOUNTER — Encounter: Payer: Self-pay | Admitting: Physician Assistant

## 2020-10-05 ENCOUNTER — Encounter: Payer: Self-pay | Admitting: Physician Assistant

## 2020-10-30 ENCOUNTER — Other Ambulatory Visit: Payer: Self-pay

## 2020-10-30 ENCOUNTER — Encounter: Payer: Self-pay | Admitting: Family Medicine

## 2020-10-30 ENCOUNTER — Ambulatory Visit (INDEPENDENT_AMBULATORY_CARE_PROVIDER_SITE_OTHER): Payer: Self-pay | Admitting: Family Medicine

## 2020-10-30 VITALS — BP 136/71 | Ht 60.0 in | Wt 148.4 lb

## 2020-10-30 DIAGNOSIS — N838 Other noninflammatory disorders of ovary, fallopian tube and broad ligament: Secondary | ICD-10-CM

## 2020-10-30 DIAGNOSIS — Z23 Encounter for immunization: Secondary | ICD-10-CM

## 2020-10-30 NOTE — Progress Notes (Signed)
   GYNECOLOGY OFFICE VISIT NOTE  History:  60 y.o. [id number] here today for new visit for incidental CT scan finding of adnexal mass (most consistent with teratoma). She denies any abnormal vaginal discharge, bleeding, pelvic pain or other concerns.   The following portions of the patient's history were reviewed and updated as appropriate: allergies, current medications, past family history, past medical history, past social history, past surgical history and problem list.   Health Maintenance:  Normal pap and negative HRHPV on 12/2018.  Normal mammogram on 12/2019.   Review of Systems:  Pertinent items noted in HPI Review of Systems  Constitutional:  Negative for fever and malaise/fatigue.  Respiratory:  Negative for cough, sputum production and shortness of breath.   Cardiovascular:  Negative for chest pain.  Gastrointestinal:  Negative for abdominal pain, blood in stool and constipation.  Genitourinary:  Negative for dysuria and frequency.  All other systems reviewed and are negative.  Objective:  Physical Exam BP 136/71   Ht 5' (1.524 m)   Wt 148 lb 6.4 oz (67.3 kg)   LMP 09/27/2012   BMI 28.98 kg/m  Physical Exam Vitals and nursing note reviewed. Exam conducted with a chaperone present.  Constitutional:      Appearance: Normal appearance.  HENT:     Head: Normocephalic.  Cardiovascular:     Rate and Rhythm: Normal rate.  Pulmonary:     Effort: Pulmonary effort is normal.  Abdominal:     General: Abdomen is flat. There is no distension.     Tenderness: There is no abdominal tenderness. There is no guarding or rebound.  Genitourinary:    Comments: Bimanual exam without any evidence of large adnexal mass Skin:    Capillary Refill: Capillary refill takes less than 2 seconds.  Neurological:     Mental Status: She is alert.    Labs and Imaging Abd CT on 06/12/2020 1.8cm right ovaria lesion with several densities, likely teratoma   Assessment & Plan:  1. Right Ovarian  lesion Incidentally found 1.8cm right ovarian lesion on CT scan. Hx of chronic hep C, HTN, HLD. No prior hx of cancer. Reports family hx of breast cancer but no ovarian cancer (patient up to date on mammogram). No weight change, change in energy level, appetite, and no abdominal pain and normal exam. Will get [place] to better characterize mass and also CA125 given post menopausal  - US Pelvis Complete; Future - CA125  2. Flu vaccine need - Flu Vaccine QUAD 36+ mos IM (Fluarix, Quad PF)   Routine preventative health maintenance measures emphasized. Please refer to After Visit Summary for other counseling recommendations.   Return in about 4 weeks (around 11/27/2020) for Teratoma  workup follow up.  Discussed with Dr. Dione Plover and Dr. Elgie Congo  Total face-to-face time with patient: 30 minutes.  Over 50% of encounter was spent on counseling and coordination of care.  Renard Matter, MD, MPH OB Fellow, [place] for [facility], [place]

## 2020-10-31 LAB — CA 125: Cancer Antigen (CA) 125: 4.8 U/mL (ref 0.0–38.1)

## 2020-11-06 ENCOUNTER — Ambulatory Visit (HOSPITAL_COMMUNITY): Payer: Medicaid Other

## 2020-11-10 ENCOUNTER — Ambulatory Visit (HOSPITAL_COMMUNITY)
Admission: RE | Admit: 2020-11-10 | Discharge: 2020-11-10 | Disposition: A | Discharge status: Home / Self Care | Payer: Self-pay | Source: Ambulatory Visit | Attending: Family Medicine | Admitting: Family Medicine

## 2020-11-10 ENCOUNTER — Other Ambulatory Visit: Payer: Self-pay

## 2020-11-10 DIAGNOSIS — N838 Other noninflammatory disorders of ovary, fallopian tube and broad ligament: Secondary | ICD-10-CM | POA: Insufficient documentation

## 2020-11-27 ENCOUNTER — Ambulatory Visit: Payer: Medicaid Other | Admitting: Obstetrics and Gynecology

## 2020-12-09 NOTE — Progress Notes (Signed)
Patient ID: Katherine Henson, female    DOB: [birth year]  MRN: [id number]  CC: Hypertension Follow-Up   Subjective: Katherine Henson is a 60 y.o. female who presents for hypertension follow-up.   Her concerns today include:   HYPERTENSION FOLLOW-UP: 09/11/2020: - Continue Amlodipine, Losartan, and Metoprolol Tartrate as prescribed.   12/14/2020: Doing well on current regimen. No side effects. No issues/concerns. Denies chest pain and shortness of breath. Home blood pressures similar to today's visit.   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 30 tablet 0   cyanocobalamin 2000 MCG tablet Take 2,000 mcg by mouth daily.     ferrous sulfate 325 (65 FE) MG tablet Take 325 mg by mouth daily with breakfast.     pantoprazole (PROTONIX) 40 MG tablet Take 1 tablet (40 mg total) by mouth 2 (two) times daily. 90 tablet 3   simvastatin (ZOCOR) 40 MG tablet Take 1 tablet (40 mg total) by mouth daily at 6 PM. 90 tablet 1   sucralfate (CARAFATE) 1 GM/10ML suspension Take 10 mLs (1 g total) by mouth 4 (four) times daily -  with meals and at bedtime. 420 mL 1   No current facility-administered medications on file prior to visit.    Allergies  Allergen Reactions   Penicillins Anaphylaxis    Did it involve swelling of the face/tongue/throat, SOB, or low BP? No Did it involve sudden  or severe rash/hives, skin peeling, or any reaction on the inside of your mouth or nose? Yes Did you need to seek medical attention at a hospital or doctor's office? No When did it last happen?  30 years ago     If all above answers are "NO", may proceed with cephalosporin use.    Social History   Socioeconomic History   Marital status: Single    Spouse name: Not on file   Number of children: 5   Years of education: Not on file   Highest education level: Not on file  Occupational History   Occupation: housekeeper  Tobacco Use   Smoking status: Former    Packs/day: 0.50    Years: 30.00    Pack years: 15.00    Types: Cigarettes    Quit date: 12/06/2010    Years since quitting: 10.0   Smokeless tobacco: Never  Vaping Use   Vaping Use: Never used  Substance and Sexual Activity   Alcohol use: Yes    Alcohol/week: 1.0 standard drink    Types: 1 Glasses of wine per week    Comment: occasional   Drug use: Not Currently    Types: Marijuana   Sexual activity: Not Currently  Other Topics Concern   Not on file  Social History Narrative   Left handed   One story home   No caffeine   Social Determinants of Health   Financial Resource Strain: Not on file  Food Insecurity: Not on  file  Transportation Needs: Not on file  Physical Activity: Not on file  Stress: Not on file  Social Connections: Not on file  Intimate Partner Violence: Not on file    Family History  Problem Relation Age of Onset   Stroke Mother    Anuerysm Mother        brain   Diabetes Sister    Breast cancer Sister    Diabetes Brother    Breast cancer Sister    Breast cancer Niece    Prostate cancer Brother    Colon cancer Neg Hx    Esophageal cancer Neg Hx    Liver disease Neg Hx    Stomach cancer Neg Hx    Pancreatic cancer Neg Hx    Inflammatory bowel disease Neg Hx    Rectal cancer Neg Hx     Past Surgical History:  Procedure Laterality Date   ENDOSCOPIC MUCOSAL RESECTION  09/21/2020    Procedure: ENDOSCOPIC MUCOSAL RESECTION;  Surgeon: Rush Landmark, Telford Nab., MD;  Location: [facility];  Service: Gastroenterology;;   ESOPHAGOGASTRODUODENOSCOPY (EGD) WITH PROPOFOL N/A 09/21/2020   Procedure: ESOPHAGOGASTRODUODENOSCOPY (EGD) WITH PROPOFOL;  Surgeon: Irving Copas., MD;  Location: [facility];  Service: Gastroenterology;  Laterality: N/A;   HEMOSTASIS CLIP PLACEMENT  09/21/2020   Procedure: HEMOSTASIS CLIP PLACEMENT;  Surgeon: Irving Copas., MD;  Location: [facility];  Service: Gastroenterology;;   NO PAST SURGERIES     SUBMUCOSAL LIFTING INJECTION  09/21/2020   Procedure: SUBMUCOSAL LIFTING INJECTION;  Surgeon: Irving Copas., MD;  Location: [facility];  Service: Gastroenterology;;    ROS: Review of Systems Negative except as stated above  PHYSICAL EXAM: BP 118/73 (BP Location: Right Arm, Patient Position: Sitting, Cuff Size: Normal)   Pulse (!) 59   Temp 98.5 F (36.9 C) (Oral)   Resp 20   Ht 5' (1.524 m)   Wt 145 lb (65.8 kg)   LMP 09/27/2012   SpO2 97%   BMI 28.32 kg/m   Physical Exam HENT:     Head: Normocephalic and atraumatic.  Eyes:     Extraocular Movements: Extraocular movements intact.     Conjunctiva/sclera: Conjunctivae normal.     Pupils: Pupils are equal, round, and reactive to light.  Cardiovascular:     Rate and Rhythm: Bradycardia present.     Pulses: Normal pulses.     Heart sounds: Normal heart sounds.  Pulmonary:     Effort: Pulmonary effort is normal.     Breath sounds: Normal breath sounds.  Musculoskeletal:     Cervical back: Normal range of motion and neck supple.  Neurological:     General: No focal deficit present.     Mental Status: She is alert and oriented to person, place, and time.  Psychiatric:        Mood and Affect: Mood normal.        Behavior: Behavior normal.   ASSESSMENT AND PLAN: 1. Essential hypertension: - Continue Amlodipine, Losartan, and Metoprolol Tartrate as prescribed.   - Counseled on blood pressure goal of less than 130/80, low-sodium, DASH diet, medication compliance, 150 minutes of moderate intensity exercise per week as tolerated. Discussed medication compliance, adverse effects. - BMP to evaluate kidney function and electrolyte balance. - Follow-up with primary provider in 3 months or sooner if needed. - Basic Metabolic Panel - metoprolol tartrate (LOPRESSOR) 25 MG tablet; Take 1/2 tablet (12.5 mg total) by mouth twice daily.  Dispense: 30 tablet; Refill: 2 - amLODipine (NORVASC) 10 MG tablet;  Take 1 tablet (10 mg total) by mouth daily.  Dispense: 90 tablet; Refill: 0 - losartan (COZAAR) 50 MG tablet; Take 1 tablet (50 mg total) by mouth daily.  Dispense: 90 tablet; Refill: 0   Patient was given the opportunity to ask questions.  Patient verbalized understanding of the plan and was able to repeat key elements of the plan. Patient was given clear instructions to go to Emergency Department or return to medical center if symptoms don't improve, worsen, or new problems develop.The patient verbalized understanding.   Orders Placed This Encounter  Procedures   Basic Metabolic Panel    Requested Prescriptions   Signed Prescriptions Disp Refills   metoprolol tartrate (LOPRESSOR) 25 MG tablet 30 tablet 2    Sig: Take 1/2 tablet (12.5 mg total) by mouth twice daily.   amLODipine (NORVASC) 10 MG tablet 90 tablet 0    Sig: Take 1 tablet (10 mg total) by mouth daily.   losartan (COZAAR) 50 MG tablet 90 tablet 0    Sig: Take 1 tablet (50 mg total) by mouth daily.    Return in about 3 months (around 03/16/2021) for Follow-Up or next available hypertension .  Camillia Herter, NP

## 2020-12-14 ENCOUNTER — Other Ambulatory Visit: Payer: Self-pay

## 2020-12-14 ENCOUNTER — Encounter: Payer: Self-pay | Admitting: Family

## 2020-12-14 ENCOUNTER — Ambulatory Visit (INDEPENDENT_AMBULATORY_CARE_PROVIDER_SITE_OTHER): Payer: Self-pay | Admitting: Family

## 2020-12-14 VITALS — BP 118/73 | HR 59 | Temp 98.5°F | Resp 20 | Ht 60.0 in | Wt 145.0 lb

## 2020-12-14 DIAGNOSIS — I10 Essential (primary) hypertension: Secondary | ICD-10-CM

## 2020-12-14 MED ORDER — METOPROLOL TARTRATE 25 MG PO TABS: ORAL_TABLET | ORAL | 2 refills | Status: DC

## 2020-12-14 MED ORDER — LOSARTAN POTASSIUM 50 MG PO TABS: 50.0000 mg | ORAL_TABLET | Freq: Every day | ORAL | 0 refills | Status: DC

## 2020-12-14 MED ORDER — AMLODIPINE BESYLATE 10 MG PO TABS: 10.0000 mg | ORAL_TABLET | Freq: Every day | ORAL | 0 refills | Status: DC

## 2020-12-15 ENCOUNTER — Other Ambulatory Visit: Payer: Self-pay | Admitting: Family

## 2020-12-15 DIAGNOSIS — Z1231 Encounter for screening mammogram for malignant neoplasm of breast: Secondary | ICD-10-CM

## 2020-12-15 LAB — BASIC METABOLIC PANEL
BUN/Creatinine Ratio: 18 (ref 12–28)
BUN: 16 mg/dL (ref 8–27)
CO2: 22 mmol/L (ref 20–29)
Calcium: 9.6 mg/dL (ref 8.7–10.3)
Chloride: 106 mmol/L (ref 96–106)
Creatinine, Ser: 0.89 mg/dL (ref 0.57–1.00)
Glucose: 75 mg/dL (ref 70–99)
Potassium: 4.4 mmol/L (ref 3.5–5.2)
Sodium: 143 mmol/L (ref 134–144)
eGFR: 74 mL/min/{1.73_m2} (ref >59)

## 2020-12-15 NOTE — Progress Notes (Signed)
Please call patient with update.   Kidney function normal.

## 2020-12-18 ENCOUNTER — Other Ambulatory Visit (HOSPITAL_COMMUNITY): Payer: Self-pay

## 2021-01-01 ENCOUNTER — Ambulatory Visit: Payer: Medicaid Other

## 2021-01-06 ENCOUNTER — Ambulatory Visit: Payer: Medicaid Other | Attending: Family

## 2021-01-06 ENCOUNTER — Other Ambulatory Visit: Payer: Self-pay

## 2021-01-19 NOTE — Progress Notes (Deleted)
GYNECOLOGY OFFICE VISIT NOTE  History:   Katherine Henson is a 60 y.o. [id number] here today for right ovarian dermoid that is 1.4 cm in size. She had a CA 125 which was normal. She had the [place] on 11/11/2020. It was also present on her CT on 06/12/20 at which time is measured 1.8 cm.   She denies any abnormal vaginal discharge, bleeding, pelvic pain or other concerns.     Past Medical History:  Diagnosis Date   Allergy    Chronic hepatitis C ([place]) 12/06/2018   Depression    HTN (hypertension)    Hyperlipidemia    Stroke [facility])     Past Surgical History:  Procedure Laterality Date   ENDOSCOPIC MUCOSAL RESECTION  09/21/2020   Procedure: ENDOSCOPIC MUCOSAL RESECTION;  Surgeon: Irving Copas., MD;  Location: [facility];  Service: Gastroenterology;;   ESOPHAGOGASTRODUODENOSCOPY (EGD) WITH PROPOFOL N/A 09/21/2020   Procedure: ESOPHAGOGASTRODUODENOSCOPY (EGD) WITH PROPOFOL;  Surgeon: Irving Copas., MD;  Location: [facility];  Service: Gastroenterology;  Laterality: N/A;   HEMOSTASIS CLIP PLACEMENT  09/21/2020   Procedure: HEMOSTASIS CLIP PLACEMENT;  Surgeon: Rush Landmark Telford Nab., MD;  Location: [facility];  Service: Gastroenterology;;   NO PAST SURGERIES     SUBMUCOSAL LIFTING INJECTION  09/21/2020   Procedure: SUBMUCOSAL LIFTING INJECTION;  Surgeon: Irving Copas., MD;  Location: [facility];  Service: Gastroenterology;;    The following portions of the patient's history were reviewed and updated as appropriate: allergies, current medications, past family history, past medical history, past social history, past surgical history and problem list.   Health Maintenance:   Diagnosis  Date Value Ref Range Status  01/03/2019   Final   - Negative for intraepithelial lesion or malignancy (NILM)     Normal mammogram on 12/2019.   Review of Systems:  Pertinent items noted in HPI and remainder of comprehensive ROS otherwise negative.  Physical Exam:  LMP  09/27/2012  CONSTITUTIONAL: Well-developed, well-nourished female in no acute distress.  HEENT:  Normocephalic, atraumatic. External right and left ear normal. No scleral icterus.  NECK: Normal range of motion, supple, no masses noted on observation SKIN: No rash noted. Not diaphoretic. No erythema. No pallor. MUSCULOSKELETAL: Normal range of motion. No edema noted. NEUROLOGIC: Alert and oriented to person, place, and time. Normal muscle tone coordination. No cranial nerve deficit noted. PSYCHIATRIC: Normal mood and affect. Normal behavior. Normal judgment and thought content.  CARDIOVASCULAR: Normal heart rate noted RESPIRATORY: Effort and breath sounds normal, no problems with respiration noted ABDOMEN: No masses noted. No other overt distention noted.    PELVIC: Deferred  Labs and Imaging No results found for this or any previous visit (from the past 168 hour(s)). No results found.  Assessment and Plan:    1. Dermoid cyst of right ovary *** - We discussed options for the dermoid. She could opt to avoid surgery and follow the dermoid with ultrasound. It has not grown since at least August so this is an option given she has other medical co-morbidities that increase her risk at the time of surgery, namely her history of stroke and HTN.  - We also discussed the option for surgery consisting of a BSO. There is no indication at this age to have an ovarian cystectomy. We discussed the risks of surgery which include but are not limited to bleeding, infection, injury to surrounding organs/tissues, need for additional surgery, conversion to open surgery.    Routine preventative health maintenance measures emphasized. Please refer to  After Visit Summary for other counseling recommendations.   No follow-ups on file.   Radene Gunning, MD, [place] for [facility], [place]

## 2021-01-20 ENCOUNTER — Ambulatory Visit: Payer: Medicaid Other | Admitting: Obstetrics and Gynecology

## 2021-01-22 ENCOUNTER — Other Ambulatory Visit: Payer: Self-pay | Admitting: Family

## 2021-01-22 DIAGNOSIS — Z1231 Encounter for screening mammogram for malignant neoplasm of breast: Secondary | ICD-10-CM

## 2021-01-23 ENCOUNTER — Other Ambulatory Visit: Payer: Self-pay | Admitting: Family

## 2021-01-23 DIAGNOSIS — Z1231 Encounter for screening mammogram for malignant neoplasm of breast: Secondary | ICD-10-CM

## 2021-02-03 ENCOUNTER — Other Ambulatory Visit: Payer: Self-pay

## 2021-02-03 ENCOUNTER — Ambulatory Visit (INDEPENDENT_AMBULATORY_CARE_PROVIDER_SITE_OTHER): Payer: Self-pay | Admitting: Certified Nurse Midwife

## 2021-02-03 VITALS — BP 114/68 | HR 59 | Wt 150.4 lb

## 2021-02-03 DIAGNOSIS — Z5941 Food insecurity: Secondary | ICD-10-CM

## 2021-02-03 DIAGNOSIS — D270 Benign neoplasm of right ovary: Secondary | ICD-10-CM

## 2021-02-03 NOTE — Progress Notes (Signed)
History:  Ms. Katherine Henson is a 60 y.o. [id number] who presents to clinic today for discussion of recent results regarding a possible ovarian teratoma. Reports no physical complaints and denies abdominal/pelvic pain/pressure.  The following portions of the patient's history were reviewed and updated as appropriate: allergies, current medications, family history, past medical history, social history, past surgical history and problem list.  Review of Systems:  Pertinent items noted in HPI and remainder of comprehensive ROS otherwise negative.    Objective:  Physical Exam BP 114/68   Pulse (!) 59   Wt 150 lb 6.4 oz (68.2 kg)   LMP 09/27/2012   BMI 29.37 kg/m  Physical Exam Vitals and nursing note reviewed.  Constitutional:      General: She is not in acute distress.    Appearance: Normal appearance. She is normal weight. She is not ill-appearing.  HENT:     Head: Normocephalic and atraumatic.  Eyes:     Pupils: Pupils are equal, round, and reactive to light.  Cardiovascular:     Rate and Rhythm: Normal rate and regular rhythm.  Pulmonary:     Effort: Pulmonary effort is normal.  Abdominal:     Palpations: Abdomen is soft.     Tenderness: There is no abdominal tenderness.  Musculoskeletal:        General: Normal range of motion.  Skin:    General: Skin is warm.     Capillary Refill: Capillary refill takes less than 2 seconds.  Neurological:     Mental Status: She is alert and oriented to person, place, and time.  Psychiatric:        Mood and Affect: Mood normal.        Behavior: Behavior normal.        Thought Content: Thought content normal.        Judgment: Judgment normal.     Labs and Imaging Cancer Antigen (CA) 125 0.0 - 38.1 U/mL 4.8   Comment: Roche [employer] Electrochemiluminescence Immunoassay (ECLIA)  Values obtained with different assay methods or kits cannot be  used interchangeably.  Results cannot be interpreted as absolute  evidence of the presence  or absence of malignant disease.    US Pelvic Complete with Transvaginal CLINICAL DATA:  Initial evaluation for right ovarian teratoma. EXAM: TRANSABDOMINAL AND TRANSVAGINAL ULTRASOUND OF PELVIS TECHNIQUE: Both transabdominal and transvaginal ultrasound examinations of the pelvis were performed. Transabdominal technique was performed for global imaging of the pelvis including uterus, ovaries, adnexal regions, and pelvic cul-de-sac. It was necessary to proceed with endovaginal exam following the transabdominal exam to visualize the uterus, endometrium, and ovaries. COMPARISON:  Prior CT from 06/12/2020. FINDINGS: Uterus Measurements: 7.8 x 4.3 x 5.1 cm = volume: 90.0 mL. Uterus is anteverted. 1.1 x 1.0 x 1.3 cm intramural fibroid present at the posterior uterine body. Endometrium Thickness: 4 mm. No focal abnormality visualized. Trace simple fluid noted within the endometrial cavity. Right ovary Measurements: 2.1 x 1.6 x 1.9 cm = volume: 3.3 mL. 1.4 x 1.3 x 1.4 cm echogenic lesion, consistent with an ovarian dermoid, corresponding with abnormality on prior CT. No other adnexal mass. Left ovary Measurements: 2.0 x 2.1 x 1.1 cm = volume: 2.4 mL. Normal appearance/no adnexal mass. Other findings No abnormal free fluid. IMPRESSION: 1. 1.4 cm right ovarian dermoid, corresponding with abnormality on prior CT. 2. 1.3 cm intramural fibroid at the posterior uterine body. 3. Trace simple fluid within the endometrial cavity, nonspecific. Otherwise normal endometrium. 4. Normal left ovary.  No  other adnexal mass or free fluid.   Electronically Signed   By: Katherine Henson M.D.   On: 11/11/2020 04:50 Assessment & Plan:  1. Dermoid cyst of right ovary - Discussed findings with Dr. Ernestina Henson prior to visit. Reassured pt of presence of dermoid vs teratoma. Since no complaints of pain/pressure, it does not require surgical intervention but she can follow up if that changes. Pt expressed  understanding and relief.  2. Food insecurity - AMBULATORY REFERRAL TO [place] [facility]  Follow up for annual gynecological exam or PRN.   Katherine Henson, CNM 02/03/2021 9:35 AM

## 2021-02-04 ENCOUNTER — Other Ambulatory Visit: Payer: Self-pay | Admitting: Internal Medicine

## 2021-02-04 DIAGNOSIS — E782 Mixed hyperlipidemia: Secondary | ICD-10-CM

## 2021-02-11 ENCOUNTER — Other Ambulatory Visit: Payer: Self-pay

## 2021-02-11 ENCOUNTER — Ambulatory Visit
Admission: RE | Admit: 2021-02-11 | Discharge: 2021-02-11 | Disposition: A | Discharge status: Home / Self Care | Payer: Medicaid Other | Source: Ambulatory Visit | Attending: Family | Admitting: Family

## 2021-02-11 DIAGNOSIS — Z1231 Encounter for screening mammogram for malignant neoplasm of breast: Secondary | ICD-10-CM

## 2021-02-12 NOTE — Progress Notes (Signed)
Please call patient with update.   Mammogram without evidence of malignancy. Repeat in 12 months.

## 2021-03-10 NOTE — Progress Notes (Signed)
Patient ID: Katherine Henson, female    DOB: 1961-03-06  MRN: [id number]  CC: Hypertension Follow-Up   Subjective: Katherine Henson is a 61 y.o. female who presents for hypertension follow-up.   Her concerns today include:  HYPERTENSION FOLLOW-UP: 12/14/2020: - Continue Amlodipine, Losartan, and Metoprolol Tartrate as prescribed.   03/12/2021: Doing well on current regimen. No side effects. No issues/concerns. Denies chest pain and shortness of breath.   2. RIGHT KNEE PAIN: Began 3 days ago. Denies recent injury and/or trauma, swelling, redness, shortness of breath, chest pain, and additional red flag symptoms. Taking Tylenol and using ice packs without relief. Radiates down leg and up back.   Current Outpatient Medications on File Prior to Visit  Medication Sig Dispense Refill   aspirin 81 MG chewable tablet Chew 1 tablet (81 mg total) by mouth daily. 30 tablet 0   cyanocobalamin 2000 MCG tablet Take 2,000 mcg by mouth daily.     ferrous sulfate 325 (65 FE) MG tablet Take 325 mg by mouth daily with breakfast.     pantoprazole (PROTONIX) 40 MG tablet Take 1 tablet (40 mg total) by mouth 2 (two) times daily. 90 tablet 3   simvastatin (ZOCOR) 40 MG tablet TAKE 1 TABLET BY MOUTH ONCE DAILY AT  6PM 90 tablet 0   sucralfate (CARAFATE) 1 GM/10ML suspension Take 10 mLs (1 g total) by mouth 4 (four) times daily -  with meals and at bedtime. (Patient not taking: Reported on 02/03/2021) 420 mL 1   No current facility-administered medications on file prior to visit.    Allergies  Allergen Reactions   Penicillins Anaphylaxis    Did it involve swelling of the face/tongue/throat, SOB, or low BP? No Did it involve sudden or severe rash/hives, skin peeling, or any reaction on the inside of your mouth or nose? Yes Did you need to seek medical attention at a hospital or doctor's office? No When did it last happen?  30 years ago     If all above answers are NO, may proceed with cephalosporin use.     Social History   Socioeconomic History   Marital status: Single    Spouse name: Not on file   Number of children: 5   Years of education: Not on file   Highest education level: Not on file  Occupational History   Occupation: housekeeper  Tobacco Use   Smoking status: Former    Packs/day: 0.50    Years: 30.00    Pack years: 15.00    Types: Cigarettes    Quit date: 12/06/2010    Years since quitting: 10.2   Smokeless tobacco: Never  Vaping Use   Vaping Use: Never used  Substance and Sexual Activity   Alcohol use: Yes    Alcohol/week: 1.0 standard drink    Types: 1 Glasses of wine per week    Comment: occasional   Drug use: Not Currently    Types: Marijuana   Sexual activity: Not Currently  Other Topics Concern   Not on file  Social History Narrative   Left handed   One story home   No caffeine   Social Determinants of Health   Financial Resource Strain: Not on file  Food Insecurity: Food Insecurity Present   Worried About Charity fundraiser in the Last Year: Often true   Ran Out of Food in the Last Year: Sometimes true  Transportation Needs: Unmet Transportation Needs   Lack of Transportation (Medical): Yes   Lack  of Transportation (Non-Medical): Yes  Physical Activity: Not on file  Stress: Not on file  Social Connections: Not on file  Intimate Partner Violence: Not on file    Family History  Problem Relation Age of Onset   Stroke Mother    Anuerysm Mother        brain   Diabetes Sister    Breast cancer Sister    Breast cancer Sister    Diabetes Brother    Prostate cancer Brother    Breast cancer Niece    Breast cancer Niece    Colon cancer Neg Hx    Esophageal cancer Neg Hx    Liver disease Neg Hx    Stomach cancer Neg Hx    Pancreatic cancer Neg Hx    Inflammatory bowel disease Neg Hx    Rectal cancer Neg Hx     Past Surgical History:  Procedure Laterality Date   ENDOSCOPIC MUCOSAL RESECTION  09/21/2020   Procedure: ENDOSCOPIC MUCOSAL  RESECTION;  Surgeon: Rush Landmark, Telford Nab., MD;  Location: [facility];  Service: Gastroenterology;;   ESOPHAGOGASTRODUODENOSCOPY (EGD) WITH PROPOFOL N/A 09/21/2020   Procedure: ESOPHAGOGASTRODUODENOSCOPY (EGD) WITH PROPOFOL;  Surgeon: Irving Copas., MD;  Location: [facility];  Service: Gastroenterology;  Laterality: N/A;   HEMOSTASIS CLIP PLACEMENT  09/21/2020   Procedure: HEMOSTASIS CLIP PLACEMENT;  Surgeon: Rush Landmark Telford Nab., MD;  Location: [facility];  Service: Gastroenterology;;   NO PAST SURGERIES     SUBMUCOSAL LIFTING INJECTION  09/21/2020   Procedure: SUBMUCOSAL LIFTING INJECTION;  Surgeon: Irving Copas., MD;  Location: [facility];  Service: Gastroenterology;;    ROS: Review of Systems Negative except as stated above  PHYSICAL EXAM: BP 111/69 (BP Location: Left Arm, Patient Position: Sitting, Cuff Size: Normal)    Pulse 63    Temp 98.3 F (36.8 C)    Resp 18    Ht 5' (1.524 m)    LMP 09/27/2012    SpO2 95%    BMI 29.37 kg/m   Physical Exam HENT:     Head: Normocephalic and atraumatic.  Eyes:     Extraocular Movements: Extraocular movements intact.     Conjunctiva/sclera: Conjunctivae normal.     Pupils: Pupils are equal, round, and reactive to light.  Cardiovascular:     Rate and Rhythm: Normal rate and regular rhythm.     Pulses: Normal pulses.     Heart sounds: Normal heart sounds.  Pulmonary:     Effort: Pulmonary effort is normal.     Breath sounds: Normal breath sounds.  Musculoskeletal:     Cervical back: Normal range of motion and neck supple.     Right knee: Tenderness present.  Neurological:     General: No focal deficit present.     Mental Status: She is alert and oriented to person, place, and time.  Psychiatric:        Mood and Affect: Mood normal.        Behavior: Behavior normal.   ASSESSMENT AND PLAN: 1. Essential hypertension: - Continue Amlodipine, Losartan, and Metoprolol Tartrate as prescribed.  - Counseled on  blood pressure goal of less than 130/80, low-sodium, DASH diet, medication compliance, 150 minutes of moderate intensity exercise per week as tolerated. Discussed medication compliance, adverse effects. - Follow-up with primary provider in 4 months or sooner if needed.  - amLODipine (NORVASC) 10 MG tablet; Take 1 tablet (10 mg total) by mouth daily.  Dispense: 90 tablet; Refill: 0 - losartan (COZAAR) 50 MG tablet; Take 1  tablet (50 mg total) by mouth daily.  Dispense: 90 tablet; Refill: 0 - metoprolol tartrate (LOPRESSOR) 25 MG tablet; Take 1/2 tablet (12.5 mg total) by mouth twice daily.  Dispense: 90 tablet; Refill: 0  2. Acute pain of right knee: - Meloxicam as prescribed. Counseled on medication compliance and adverse effects. - Follow-up with primary provider in 4 weeks or sooner if needed.  - meloxicam (MOBIC) 7.5 MG tablet; Take 1 tablet (7.5 mg total) by mouth daily.  Dispense: 30 tablet; Refill: 0    Patient was given the opportunity to ask questions.  Patient verbalized understanding of the plan and was able to repeat key elements of the plan. Patient was given clear instructions to go to Emergency Department or return to medical center if symptoms don't improve, worsen, or new problems develop.The patient verbalized understanding.   Requested Prescriptions   Signed Prescriptions Disp Refills   amLODipine (NORVASC) 10 MG tablet 90 tablet 0    Sig: Take 1 tablet (10 mg total) by mouth daily.   losartan (COZAAR) 50 MG tablet 90 tablet 0    Sig: Take 1 tablet (50 mg total) by mouth daily.   metoprolol tartrate (LOPRESSOR) 25 MG tablet 90 tablet 0    Sig: Take 1/2 tablet (12.5 mg total) by mouth twice daily.    Return in about 4 months (around 07/10/2021) for Follow-Up or next available hypertension; 4 weeks knee pain.  Camillia Herter, NP

## 2021-03-12 ENCOUNTER — Ambulatory Visit (INDEPENDENT_AMBULATORY_CARE_PROVIDER_SITE_OTHER): Payer: Self-pay | Admitting: Family

## 2021-03-12 ENCOUNTER — Encounter (INDEPENDENT_AMBULATORY_CARE_PROVIDER_SITE_OTHER): Payer: Self-pay

## 2021-03-12 ENCOUNTER — Other Ambulatory Visit: Payer: Self-pay

## 2021-03-12 VITALS — BP 111/69 | HR 63 | Temp 98.3°F | Resp 18 | Ht 60.0 in

## 2021-03-12 DIAGNOSIS — M25561 Pain in right knee: Secondary | ICD-10-CM

## 2021-03-12 DIAGNOSIS — I10 Essential (primary) hypertension: Secondary | ICD-10-CM

## 2021-03-12 MED ORDER — METOPROLOL TARTRATE 25 MG PO TABS: ORAL_TABLET | ORAL | 0 refills | Status: DC

## 2021-03-12 MED ORDER — LOSARTAN POTASSIUM 50 MG PO TABS: 50.0000 mg | ORAL_TABLET | Freq: Every day | ORAL | 0 refills | Status: DC

## 2021-03-12 MED ORDER — AMLODIPINE BESYLATE 10 MG PO TABS: 10.0000 mg | ORAL_TABLET | Freq: Every day | ORAL | 0 refills | Status: DC

## 2021-03-12 MED ORDER — MELOXICAM 7.5 MG PO TABS
7.5000 mg | ORAL_TABLET | Freq: Every day | ORAL | 0 refills | Status: DC
Start: 2021-03-12 — End: 2021-10-11

## 2021-03-12 NOTE — Progress Notes (Signed)
Pt presents for hypertension follow-up, pt complains of right knee pain radiating up thigh that started 3-4 days ago

## 2021-03-23 ENCOUNTER — Ambulatory Visit: Payer: Medicaid Other | Admitting: Family

## 2021-05-14 ENCOUNTER — Other Ambulatory Visit: Payer: Self-pay | Admitting: Family Medicine

## 2021-05-14 DIAGNOSIS — E782 Mixed hyperlipidemia: Secondary | ICD-10-CM

## 2021-06-14 ENCOUNTER — Other Ambulatory Visit: Payer: Self-pay | Admitting: Gastroenterology

## 2021-06-14 ENCOUNTER — Telehealth: Payer: Self-pay

## 2021-06-14 NOTE — Telephone Encounter (Signed)
She was on the recall list. Thanks. ?

## 2021-06-14 NOTE — Telephone Encounter (Signed)
Patient needs office visit.  

## 2021-06-14 NOTE — Telephone Encounter (Signed)
Ro is this pt in your list of recalls?  If so, you can get rid of it.  I will work on getting her scheduled.  ?

## 2021-06-14 NOTE — Telephone Encounter (Signed)
Mansouraty, Telford Nab., MD  Aleatha Borer, LPN; Timothy Lasso, RN; Debbe Mounts, Gu [place]; Thornton Park, MD ?I am sure it is in our list of patients.  We can certainly do the repeat biopsies for H. pylori at the time of EGD, though it is likely going to not be until the end of May or into June based on what I am seeing my schedule is.  ?Katherine Henson or Katherine Henson please work on scheduling this patient's EGD follow-up 60-minute case EMR.  ?Thanks.  ?GM  ?

## 2021-06-15 ENCOUNTER — Other Ambulatory Visit: Payer: Self-pay

## 2021-06-15 DIAGNOSIS — D132 Benign neoplasm of duodenum: Secondary | ICD-10-CM

## 2021-06-15 NOTE — Telephone Encounter (Signed)
Left message on machine to call back  

## 2021-06-15 NOTE — Telephone Encounter (Signed)
EGD EMR  scheduled, pt instructed and medications reviewed.  Patient instructions mailed to home.  Patient to call with any questions or concerns.  

## 2021-06-15 NOTE — Telephone Encounter (Signed)
EGD EMR has been scheduled with [facility] at [facility] on 08/19/21 at 845 am  ?

## 2021-06-15 NOTE — Telephone Encounter (Signed)
All information mailed to the pt home  ?

## 2021-07-03 NOTE — Progress Notes (Signed)
? ? ?Patient ID: Katherine Henson, female    DOB: 23-Aug-1960  MRN: [id number] ? ?CC: Hypertension Follow-Up ? ?Subjective: ?Katherine Henson is a 61 y.o. female who presents for hypertension follow-up.  ? ?Her concerns today include:  ?Hypertension follow-up: ?03/12/2021: ?- Continue Amlodipine, Losartan, and Metoprolol Tartrate as prescribed.  ? ?07/09/2021: ?Doing well on current regimen. No side effects. No issues/concerns. Denies chest pain, shortness of breath, worst headache of life and additional red flag symptoms. Home blood pressures similar to today's office reading.   ? ?2. Hyperlipidemia follow-up: ?Doing well on current Simvastatin, no issues/concerns.   ? ?Patient Active Problem List  ? Diagnosis Date Noted  ? Abnormal findings on esophagogastroduodenoscopy (EGD) 07/22/2020  ? Dysphagia 07/22/2020  ? 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 Helicobacter pylori infection 07/22/2020  ? Esophagitis determined by endoscopy 07/22/2020  ? History of cerebellar stroke 11/26/2019  ? Mixed hyperlipidemia 11/26/2019  ? Positive double stranded DNA antibody test 04/10/2019  ? Chronic hepatitis C without hepatic coma ([place]) 12/06/2018  ? HTN (hypertension) 04/30/2018  ?  ? ?Current Outpatient Medications on File Prior to Visit  ?Medication Sig Dispense Refill  ? aspirin 81 MG chewable tablet Chew 1 tablet (81 mg total) by mouth daily. 30 tablet 0  ? cyanocobalamin 2000 MCG tablet Take 2,000 mcg by mouth daily.    ? ferrous sulfate 325 (65 FE) MG tablet Take 325 mg by mouth daily with breakfast.    ? meloxicam (MOBIC) 7.5 MG tablet Take 1 tablet (7.5 mg total) by mouth daily. 30 tablet 0  ? pantoprazole (PROTONIX) 40 MG tablet Take 1 tablet by mouth twice daily 180 tablet 0  ? sucralfate (CARAFATE) 1 GM/10ML suspension Take 10 mLs (1 g total) by mouth 4 (four) times daily -  with meals and at bedtime. (Patient not taking: Reported on 02/03/2021) 420 mL 1  ? ?No current facility-administered medications on file prior to  visit.  ? ? ?Allergies  ?Allergen Reactions  ? Penicillins Anaphylaxis  ?  Did it involve swelling of the face/tongue/throat, SOB, or low BP? No ?Did it involve sudden or severe rash/hives, skin peeling, or any reaction on the inside of your mouth or nose? Yes ?Did you need to seek medical attention at a hospital or doctor's office? No ?When did it last happen?  30 years ago     ?If all above answers are ?NO?, may proceed with cephalosporin use.  ? ? ?Social History  ? ?Socioeconomic History  ? Marital status: Single  ?  Spouse name: Not on file  ? Number of children: 5  ? Years of education: Not on file  ? Highest education level: Not on file  ?Occupational History  ? Occupation: housekeeper  ?Tobacco Use  ? Smoking status: Former  ?  Packs/day: 0.50  ?  Years: 30.00  ?  Pack years: 15.00  ?  Types: Cigarettes  ?  Quit date: 12/06/2010  ?  Years since quitting: 10.5  ? Smokeless tobacco: Never  ?Vaping Use  ? Vaping Use: Never used  ?Substance and Sexual Activity  ? Alcohol use: Yes  ?  Alcohol/week: 1.0 standard drink  ?  Types: 1 Glasses of wine per week  ?  Comment: occasional  ? Drug use: Not Currently  ?  Types: Marijuana  ? Sexual activity: Not Currently  ?Other Topics Concern  ? Not on file  ?Social History Narrative  ? Left handed  ? One story home  ? No caffeine  ? ?  Social Determinants of Health  ? ?Financial Resource Strain: Not on file  ?Food Insecurity: Food Insecurity Present  ? Worried About Charity fundraiser in the Last Year: Often true  ? Ran Out of Food in the Last Year: Sometimes true  ?Transportation Needs: Unmet Transportation Needs  ? Lack of Transportation (Medical): Yes  ? Lack of Transportation (Non-Medical): Yes  ?Physical Activity: Not on file  ?Stress: Not on file  ?Social Connections: Not on file  ?Intimate Partner Violence: Not on file  ? ? ?Family History  ?Problem Relation Age of Onset  ? Stroke Mother   ? Anuerysm Mother   ?     brain  ? Diabetes Sister   ? Breast cancer Sister   ?  Breast cancer Sister   ? Diabetes Brother   ? Prostate cancer Brother   ? Breast cancer Niece   ? Breast cancer Niece   ? Colon cancer Neg Hx   ? Esophageal cancer Neg Hx   ? Liver disease Neg Hx   ? Stomach cancer Neg Hx   ? Pancreatic cancer Neg Hx   ? Inflammatory bowel disease Neg Hx   ? Rectal cancer Neg Hx   ? ? ?Past Surgical History:  ?Procedure Laterality Date  ? ENDOSCOPIC MUCOSAL RESECTION  09/21/2020  ? Procedure: ENDOSCOPIC MUCOSAL RESECTION;  Surgeon: Rush Landmark Telford Nab., MD;  Location: [facility];  Service: Gastroenterology;;  ? ESOPHAGOGASTRODUODENOSCOPY (EGD) WITH PROPOFOL N/A 09/21/2020  ? Procedure: ESOPHAGOGASTRODUODENOSCOPY (EGD) WITH PROPOFOL;  Surgeon: Rush Landmark Telford Nab., MD;  Location: [facility];  Service: Gastroenterology;  Laterality: N/A;  ? HEMOSTASIS CLIP PLACEMENT  09/21/2020  ? Procedure: HEMOSTASIS CLIP PLACEMENT;  Surgeon: Irving Copas., MD;  Location: [facility];  Service: Gastroenterology;;  ? NO PAST SURGERIES    ? SUBMUCOSAL LIFTING INJECTION  09/21/2020  ? Procedure: SUBMUCOSAL LIFTING INJECTION;  Surgeon: Rush Landmark Telford Nab., MD;  Location: [facility];  Service: Gastroenterology;;  ? ? ?ROS: ?Review of Systems ?Negative except as stated above ? ?PHYSICAL EXAM: ?BP 115/74 (BP Location: Left Arm, Patient Position: Sitting, Cuff Size: Normal)   Pulse 61   Temp 98.5 ?F (36.9 ?C)   Resp 18   Ht 5' (1.524 m)   Wt 150 lb (68 kg)   LMP 09/27/2012   SpO2 96%   BMI 29.29 kg/m?  ? ?Physical Exam ?HENT:  ?   Head: Normocephalic and atraumatic.  ?Eyes:  ?   Extraocular Movements: Extraocular movements intact.  ?   Conjunctiva/sclera: Conjunctivae normal.  ?   Pupils: Pupils are equal, round, and reactive to light.  ?Cardiovascular:  ?   Rate and Rhythm: Normal rate and regular rhythm.  ?   Pulses: Normal pulses.  ?   Heart sounds: Normal heart sounds.  ?Pulmonary:  ?   Effort: Pulmonary effort is normal.  ?   Breath sounds: Normal breath sounds.   ?Musculoskeletal:  ?   Cervical back: Normal range of motion and neck supple.  ?Neurological:  ?   General: No focal deficit present.  ?   Mental Status: She is alert and oriented to person, place, and time.  ?Psychiatric:     ?   Mood and Affect: Mood normal.     ?   Behavior: Behavior normal.  ? ?Results for orders placed or performed in visit on 07/09/21  ?POCT glycosylated hemoglobin (Hb A1C)  ?Result Value Ref Range  ? Hemoglobin A1C 5.6 4.0 - 5.6 %  ? HbA1c POC (<> result,  manual entry)    ? HbA1c, POC (prediabetic range)    ? HbA1c, POC (controlled diabetic range)    ? ? ?ASSESSMENT AND PLAN: ?1. Essential (primary) hypertension: ?- Continue Amlodipine, Losartan, and Metoprolol Tartrate as prescribed.  ?- Counseled on blood pressure goal of less than 130/80, low-sodium, DASH diet, medication compliance, and 150 minutes of moderate intensity exercise per week as tolerated. Counseled on medication adherence and adverse effects. ?- Update BMP. ?- Follow-up with primary provider in 4 months or sooner if needed.  ?- Basic Metabolic Panel ?- amLODipine (NORVASC) 10 MG tablet; Take 1 tablet (10 mg total) by mouth daily.  Dispense: 120 tablet; Refill: 0 ?- losartan (COZAAR) 50 MG tablet; Take 1 tablet (50 mg total) by mouth daily.  Dispense: 120 tablet; Refill: 0 ?- metoprolol tartrate (LOPRESSOR) 25 MG tablet; Take 1/2 tablet (12.5 mg total) by mouth twice daily.  Dispense: 120 tablet; Refill: 0 ? ?2. Mixed hyperlipidemia: ?- Continue Simvastatin as prescribed.  ?- Patient plans to return to office for repeat fasting lipid panel.  ?- Follow-up with primary provider as scheduled.  ?- Lipid panel; Future ?- simvastatin (ZOCOR) 40 MG tablet; Take 1 tablet (40 mg total) by mouth daily at 6 PM.  Dispense: 90 tablet; Refill: 1 ? ?3. Prediabetes: ?- Hemoglobin A1c 5.6% and remaining consistent with prediabetes.  ?- Recheck in 6 months.  ?- POCT glycosylated hemoglobin (Hb A1C) ? ?Patient was given the opportunity to ask  questions.  Patient verbalized understanding of the plan and was able to repeat key elements of the plan. Patient was given clear instructions to go to Emergency Department or return to medical center if symptoms don't im

## 2021-07-09 ENCOUNTER — Ambulatory Visit (INDEPENDENT_AMBULATORY_CARE_PROVIDER_SITE_OTHER): Payer: Self-pay | Admitting: Family

## 2021-07-09 VITALS — BP 115/74 | HR 61 | Temp 98.5°F | Resp 18 | Ht 60.0 in | Wt 150.0 lb

## 2021-07-09 DIAGNOSIS — E782 Mixed hyperlipidemia: Secondary | ICD-10-CM

## 2021-07-09 DIAGNOSIS — I10 Essential (primary) hypertension: Secondary | ICD-10-CM

## 2021-07-09 DIAGNOSIS — R7303 Prediabetes: Secondary | ICD-10-CM

## 2021-07-09 LAB — POCT GLYCOSYLATED HEMOGLOBIN (HGB A1C): Hemoglobin A1C: 5.6 % (ref 4.0–5.6)

## 2021-07-09 MED ORDER — METOPROLOL TARTRATE 25 MG PO TABS: ORAL_TABLET | ORAL | 0 refills | Status: DC

## 2021-07-09 MED ORDER — AMLODIPINE BESYLATE 10 MG PO TABS: 10.0000 mg | ORAL_TABLET | Freq: Every day | ORAL | 0 refills | Status: DC

## 2021-07-09 MED ORDER — SIMVASTATIN 40 MG PO TABS: 40.0000 mg | ORAL_TABLET | Freq: Every day | ORAL | 1 refills | Status: DC

## 2021-07-09 MED ORDER — LOSARTAN POTASSIUM 50 MG PO TABS: 50.0000 mg | ORAL_TABLET | Freq: Every day | ORAL | 0 refills | Status: DC

## 2021-07-09 NOTE — Progress Notes (Signed)
Pt presents for hypertension f/u ?BMP collected lipid future dated pt not fasting  ?

## 2021-07-09 NOTE — Progress Notes (Signed)
Discussed in office

## 2021-07-10 LAB — BASIC METABOLIC PANEL
BUN/Creatinine Ratio: 14 (ref 12–28)
BUN: 13 mg/dL (ref 8–27)
CO2: 23 mmol/L (ref 20–29)
Calcium: 9.6 mg/dL (ref 8.7–10.3)
Chloride: 107 mmol/L — ABNORMAL HIGH (ref 96–106)
Creatinine, Ser: 0.91 mg/dL (ref 0.57–1.00)
Glucose: 103 mg/dL — ABNORMAL HIGH (ref 70–99)
Potassium: 4.2 mmol/L (ref 3.5–5.2)
Sodium: 145 mmol/L — ABNORMAL HIGH (ref 134–144)
eGFR: 72 mL/min/{1.73_m2} (ref >59)

## 2021-07-10 NOTE — Progress Notes (Signed)
Call patient with update.  ? ?Kidney function normal.  ? ?All other values are normal, stable or within acceptable limits. ? ?Katherine Herter, NP 07/10/2021 8:02 AM  ?

## 2021-08-09 ENCOUNTER — Encounter (HOSPITAL_COMMUNITY): Payer: Self-pay | Admitting: Gastroenterology

## 2021-10-04 ENCOUNTER — Telehealth: Payer: Self-pay | Admitting: Gastroenterology

## 2021-10-04 ENCOUNTER — Encounter (HOSPITAL_COMMUNITY): Payer: Self-pay | Admitting: Gastroenterology

## 2021-10-04 NOTE — Telephone Encounter (Signed)
The pt has been advised that arrival time is 945 am on 8/7 at [facility] with nothing to eat or drink after midnight.  She needs a driver and will be there 3-4 hours.  She has been advised to call if she has any further questions.

## 2021-10-04 NOTE — Progress Notes (Signed)
Attempted to obtain medical history via telephone, unable to reach at this time. HIPAA compliant voicemail message left requesting return call to pre surgical testing department. 

## 2021-10-04 NOTE — Telephone Encounter (Signed)
Patient called regarding her upcoming procedure at [facility] with Dr. Rush Landmark.  She needs to know the time she needs to arrive and if the instructions have changed from the previous date she had scheduled.  Please call patient and advise.  Thank you.

## 2021-10-10 NOTE — Anesthesia Preprocedure Evaluation (Addendum)
Anesthesia Evaluation  Patient identified by MRN, date of birth, ID band Patient awake    Reviewed: Allergy & Precautions, NPO status , Patient's Chart, lab work & pertinent test results  Airway Mallampati: II  TM Distance: >3 FB Neck ROM: Full    Dental no notable dental hx. (+) Missing, Dental Advisory Given, Poor Dentition,    Pulmonary Patient abstained from smoking., former smoker,    Pulmonary exam normal breath sounds clear to auscultation       Cardiovascular hypertension, Normal cardiovascular exam Rhythm:Regular Rate:Normal     Neuro/Psych Depression CVA, No Residual Symptoms    GI/Hepatic (+) Hepatitis -, C  Endo/Other    Renal/GU      Musculoskeletal   Abdominal   Peds  Hematology   Anesthesia Other Findings All: PCN  Reproductive/Obstetrics                            Anesthesia Physical Anesthesia Plan  ASA: 3  Anesthesia Plan: MAC   Post-op Pain Management:    Induction: Intravenous  PONV Risk Score and Plan: Treatment may vary due to age or medical condition, Propofol infusion and TIVA  Airway Management Planned: Nasal Cannula, Natural Airway and Simple Face Mask  Additional Equipment: None  Intra-op Plan:   Post-operative Plan:   Informed Consent: I have reviewed the patients History and Physical, chart, labs and discussed the procedure including the risks, benefits and alternatives for the proposed anesthesia with the patient or authorized representative who has indicated his/her understanding and acceptance.     Dental advisory given  Plan Discussed with:   Anesthesia Plan Comments: (EGD for Gastric polyp)       Anesthesia Quick Evaluation

## 2021-10-11 ENCOUNTER — Ambulatory Visit (HOSPITAL_COMMUNITY): Payer: Commercial Managed Care - HMO | Admitting: Anesthesiology

## 2021-10-11 ENCOUNTER — Ambulatory Visit (HOSPITAL_COMMUNITY)
Admission: RE | Admit: 2021-10-11 | Discharge: 2021-10-11 | Disposition: A | Discharge status: Home / Self Care | Payer: Commercial Managed Care - HMO | Source: Ambulatory Visit | Attending: Gastroenterology | Admitting: Gastroenterology

## 2021-10-11 ENCOUNTER — Other Ambulatory Visit: Payer: Self-pay

## 2021-10-11 ENCOUNTER — Encounter (HOSPITAL_COMMUNITY): Payer: Self-pay | Admitting: Gastroenterology

## 2021-10-11 ENCOUNTER — Encounter (HOSPITAL_COMMUNITY)
Admission: RE | Disposition: A | Discharge status: Home / Self Care | Payer: Self-pay | Source: Ambulatory Visit | Attending: Gastroenterology

## 2021-10-11 ENCOUNTER — Ambulatory Visit (HOSPITAL_BASED_OUTPATIENT_CLINIC_OR_DEPARTMENT_OTHER): Payer: Commercial Managed Care - HMO | Admitting: Anesthesiology

## 2021-10-11 DIAGNOSIS — Z09 Encounter for follow-up examination after completed treatment for conditions other than malignant neoplasm: Secondary | ICD-10-CM | POA: Insufficient documentation

## 2021-10-11 DIAGNOSIS — K3189 Other diseases of stomach and duodenum: Secondary | ICD-10-CM

## 2021-10-11 DIAGNOSIS — I10 Essential (primary) hypertension: Secondary | ICD-10-CM | POA: Diagnosis not present

## 2021-10-11 DIAGNOSIS — Z5941 Food insecurity: Secondary | ICD-10-CM | POA: Diagnosis not present

## 2021-10-11 DIAGNOSIS — K317 Polyp of stomach and duodenum: Secondary | ICD-10-CM

## 2021-10-11 DIAGNOSIS — Z87891 Personal history of nicotine dependence: Secondary | ICD-10-CM | POA: Diagnosis not present

## 2021-10-11 DIAGNOSIS — Z5982 Transportation insecurity: Secondary | ICD-10-CM | POA: Diagnosis not present

## 2021-10-11 DIAGNOSIS — D132 Benign neoplasm of duodenum: Secondary | ICD-10-CM | POA: Diagnosis not present

## 2021-10-11 HISTORY — PX: SUBMUCOSAL LIFTING INJECTION: SHX6855

## 2021-10-11 HISTORY — PX: ESOPHAGOGASTRODUODENOSCOPY (EGD) WITH PROPOFOL: SHX5813

## 2021-10-11 HISTORY — PX: SUBMUCOSAL TATTOO INJECTION: SHX6856

## 2021-10-11 HISTORY — PX: POLYPECTOMY: SHX5525

## 2021-10-11 HISTORY — PX: HEMOSTASIS CLIP PLACEMENT: SHX6857

## 2021-10-11 HISTORY — PX: ENDOSCOPIC MUCOSAL RESECTION: SHX6839

## 2021-10-11 SURGERY — ESOPHAGOGASTRODUODENOSCOPY (EGD) WITH PROPOFOL
Anesthesia: Monitor Anesthesia Care

## 2021-10-11 MED ORDER — SPOT INK MARKER SYRINGE KIT
PACK | SUBMUCOSAL | Status: DC | PRN
  Administered 2021-10-11: 1 mL via SUBMUCOSAL

## 2021-10-11 MED ORDER — GLUCAGON HCL RDNA (DIAGNOSTIC) 1 MG IJ SOLR
INTRAMUSCULAR | Status: DC | PRN
  Administered 2021-10-11 (×3): .25 mg via INTRAVENOUS

## 2021-10-11 MED ORDER — LACTATED RINGERS IV SOLN: INTRAVENOUS | Status: DC | PRN

## 2021-10-11 MED ORDER — PROPOFOL 10 MG/ML IV BOLUS
INTRAVENOUS | Status: DC | PRN
  Administered 2021-10-11: 50 mg via INTRAVENOUS

## 2021-10-11 MED ORDER — SODIUM CHLORIDE 0.9 % IV SOLN: INTRAVENOUS | Status: DC

## 2021-10-11 MED ORDER — ONDANSETRON HCL 4 MG/2ML IJ SOLN
INTRAMUSCULAR | Status: DC | PRN
  Administered 2021-10-11: 4 mg via INTRAVENOUS

## 2021-10-11 MED ORDER — PROPOFOL 500 MG/50ML IV EMUL
INTRAVENOUS | Status: DC | PRN
  Administered 2021-10-11: 125 ug/kg/min via INTRAVENOUS

## 2021-10-11 MED ORDER — PANTOPRAZOLE SODIUM 40 MG PO TBEC: 40.0000 mg | DELAYED_RELEASE_TABLET | Freq: Two times a day (BID) | ORAL | 3 refills | Status: DC

## 2021-10-11 MED ORDER — SUCRALFATE 1 GM/10ML PO SUSP: 1.0000 g | Freq: Two times a day (BID) | ORAL | 1 refills | Status: DC

## 2021-10-11 SURGICAL SUPPLY — 15 items

## 2021-10-11 NOTE — Anesthesia Postprocedure Evaluation (Signed)
Anesthesia Post Note  Patient: Katherine Henson  Procedure(s) Performed: ESOPHAGOGASTRODUODENOSCOPY (EGD) WITH PROPOFOL ENDOSCOPIC MUCOSAL RESECTION SUBMUCOSAL LIFTING INJECTION POLYPECTOMY HEMOSTASIS CLIP PLACEMENT SUBMUCOSAL TATTOO INJECTION     Patient location during evaluation: Endoscopy Anesthesia Type: MAC Level of consciousness: awake and alert Pain management: pain level controlled Vital Signs Assessment: post-procedure vital signs reviewed and stable Respiratory status: spontaneous breathing, nonlabored ventilation, respiratory function stable and patient connected to nasal cannula oxygen Cardiovascular status: blood pressure returned to baseline and stable Postop Assessment: no apparent nausea or vomiting Anesthetic complications: no   No notable events documented.  Last Vitals:  Vitals:   10/11/21 1227 10/11/21 1231  BP: 120/64 125/78  Pulse: (!) 57 (!) 55  Resp: 13 14  Temp:  (!) 36.3 C  SpO2: 100% 100%    Last Pain:  Vitals:   10/11/21 1231  TempSrc: Temporal  PainSc: 0-No pain                 Barnet Glasgow

## 2021-10-11 NOTE — Op Note (Signed)
[facility] Patient Name: Katherine Henson Procedure Date: 10/11/2021 MRN: [id number] Attending MD: Justice Britain , MD Date of Birth: [birth year] CSN: [id number] Age: 61 Admit Type: Outpatient Procedure:                Upper GI endoscopy Indications:              Follow-up of polyps in the duodenum Providers:                Justice Britain, MD, Doristine Johns, RN,                            William Dalton, Technician Referring MD:             Thornton Park MD, MD, Camillia Herter Medicines:                Monitored Anesthesia Care, Glucagon 0.97 mg IV Complications:            No immediate complications. Estimated Blood Loss:     Estimated blood loss was minimal. Procedure:                Pre-Anesthesia Assessment:                           - Prior to the procedure, a History and Physical                            was performed, and patient medications and                            allergies were reviewed. The patient's tolerance of                            previous anesthesia was also reviewed. The risks                            and benefits of the procedure and the sedation                            options and risks were discussed with the patient.                            All questions were answered, and informed consent                            was obtained. Prior Anticoagulants: The patient has                            taken no previous anticoagulant or antiplatelet                            agents. ASA Grade Assessment: III - A patient with                            severe systemic disease. After reviewing the risks  and benefits, the patient was deemed in                            satisfactory condition to undergo the procedure.                           After obtaining informed consent, the endoscope was                            passed under direct vision. Throughout the                             procedure, the patient's blood pressure, pulse, and                            oxygen saturations were monitored continuously. The                            [id number]-[id number] ([id number] Olympus therapeutic endoscope                            was introduced through the mouth, and advanced to                            the third part of duodenum. The upper GI endoscopy                            was accomplished without difficulty. The patient                            tolerated the procedure. Scope In: Scope Out: Findings:      No gross lesions were noted in the entire esophagus.      The Z-line was regular and was found 34 cm from the incisors.      No gross lesions were noted in the entire examined stomach.      The major papilla was normal.      A medium post mucosectomy scar was found in the second/third portion of       the duodenum. There was evidence of recurrence however (approximately 20       mm in size. JNET2a endoscopic appearance mostly, but there were some       areas of what appeared to be JNET2b imaging. Preparations were made for       attempt at further resection. NBI imaging and White-light endoscopy was       done to demarcate the borders of the lesion. Eleview was injected to       raise the lesion with some success. Piecemeal mucosal resection using a       snare was performed. Resection and retrieval were complete. Coagulation       for tissue destruction using argon plasma to the margin and the base of       the lesion was successful. To prevent bleeding after mucosal resection,       four hemostatic clips were successfully placed (MR conditional) (1       Mantis allowed closure of 50% of the area and  the other three closed       things further). There was no bleeding at the end of the procedure. Area       proximal to this was tattooed with an injection of Spot (carbon black)       for demarcation purposes.      No other gross lesions were noted in the duodenal bulb,  in the first       portion of the duodenum, in the second portion of the duodenum and in       the third portion of the duodenum. Impression:               - No gross lesions in esophagus. Z-line regular, 34                            cm from the incisors.                           - No gross lesions in the stomach.                           - Normal major papilla.                           - Duodenal scar with evidence of recurrence                            (JNET2a/2b as described above. Mucosal piecemeal                            resection performed then treated with argon plasma                            coagulation (APC) to margin/base. Clips (MR                            conditional) were placed. Tattooed proximally.                           - No other gross lesions in the duodenal bulb, in                            the first portion of the duodenum, in the second                            portion of the duodenum and in the third portion of                            the duodenum. Moderate Sedation:      Not Applicable - Patient had care per Anesthesia. Recommendation:           - The patient will be observed post-procedure,                            until all discharge criteria are met.                           -  Discharge patient to home.                           - Patient has a contact number available for                            emergencies. The signs and symptoms of potential                            delayed complications were discussed with the                            patient. Return to normal activities tomorrow.                            Written discharge instructions were provided to the                            patient.                           - Soft diet.                           - Continue twice daily PPI (refills given today).                           - Carafate twice daily for 1 month. If liquid is                            too expensive,  please let us know and we can send                            in tablet form.                           - Await pathology results.                           - Monitor for signs/symptoms of bleeding,                            perforation, and infection. If issues please call                            our number to get further assistance as needed.                           - Repeat upper endoscopy in 6-9 months for                            surveillance based on pathology results and no                            evidence of malignancy. Endorotor may be necessary  at that point.                           - The findings and recommendations were discussed                            with the patient.                           - The findings and recommendations were discussed                            with the designated responsible adult. Procedure Code(s):        --- Professional ---                           [phone], Esophagogastroduodenoscopy, flexible,                            transoral; with ablation of tumor(s), polyp(s), or                            other lesion(s) (includes pre- and post-dilation                            and guide wire passage, when performed) Diagnosis Code(s):        --- Professional ---                           K31.89, Other diseases of stomach and duodenum                           K31.7, Polyp of stomach and duodenum CPT copyright 2019 [employer]. All rights reserved. The codes documented in this report are preliminary and upon coder review may  be revised to meet current compliance requirements. Justice Britain, MD 10/11/2021 12:36:05 PM Number of Addenda: 0

## 2021-10-11 NOTE — Discharge Instructions (Signed)
YOU HAD AN ENDOSCOPIC PROCEDURE TODAY: Refer to the procedure report and other information in the discharge instructions given to you for any specific questions about what was found during the examination. If this information does not answer your questions, please call [facility] office at [phone] to clarify.  ° °YOU SHOULD EXPECT: Some feelings of bloating in the abdomen. Passage of more gas than usual. Walking can help get rid of the air that was put into your GI tract during the procedure and reduce the bloating. If you had a lower endoscopy (such as a colonoscopy or flexible sigmoidoscopy) you may notice spotting of blood in your stool or on the toilet paper. Some abdominal soreness may be present for a day or two, also. ° °DIET: Your first meal following the procedure should be a light meal and then it is ok to progress to your normal diet. A half-sandwich or bowl of soup is an example of a good first meal. Heavy or fried foods are harder to digest and may make you feel nauseous or bloated. Drink plenty of fluids but you should avoid alcoholic beverages for 24 hours. If you had a esophageal dilation, please see attached instructions for diet.   ° °ACTIVITY: Your care partner should take you home directly after the procedure. You should plan to take it easy, moving slowly for the rest of the day. You can resume normal activity the day after the procedure however YOU SHOULD NOT DRIVE, use power tools, machinery or perform tasks that involve climbing or major physical exertion for 24 hours (because of the sedation medicines used during the test).  ° °SYMPTOMS TO REPORT IMMEDIATELY: °A gastroenterologist can be reached at any hour. Please call [phone]  for any of the following symptoms:  °Following lower endoscopy (colonoscopy, flexible sigmoidoscopy) °Excessive amounts of blood in the stool  °Significant tenderness, worsening of abdominal pains  °Swelling of the abdomen that is new, acute  °Fever of 100° or  higher  °Following upper endoscopy (EGD, EUS, ERCP, esophageal dilation) °Vomiting of blood or coffee ground material  °New, significant abdominal pain  °New, significant chest pain or pain under the shoulder blades  °Painful or persistently difficult swallowing  °New shortness of breath  °Black, tarry-looking or red, bloody stools ° °FOLLOW UP:  °If any biopsies were taken you will be contacted by phone or by letter within the next 1-3 weeks. Call [phone]  if you have not heard about the biopsies in 3 weeks.  °Please also call with any specific questions about appointments or follow up tests. ° °

## 2021-10-11 NOTE — Transfer of Care (Signed)
Immediate Anesthesia Transfer of Care Note  Patient: Katherine Henson  Procedure(s) Performed: ESOPHAGOGASTRODUODENOSCOPY (EGD) WITH PROPOFOL ENDOSCOPIC MUCOSAL RESECTION SUBMUCOSAL LIFTING INJECTION POLYPECTOMY HEMOSTASIS CLIP PLACEMENT SUBMUCOSAL TATTOO INJECTION  Patient Location: PACU  Anesthesia Type:MAC  Level of Consciousness: awake, alert , oriented and patient cooperative  Airway & Oxygen Therapy: Patient Spontanous Breathing and Patient connected to face mask oxygen  Post-op Assessment: Report given to RN, Post -op Vital signs reviewed and stable and Patient moving all extremities X 4  Post vital signs: Reviewed and stable  Last Vitals:  Vitals Value Taken Time  BP 120/64 10/11/21 1227  Temp    Pulse 55 10/11/21 1230  Resp 14 10/11/21 1230  SpO2 100 % 10/11/21 1230  Vitals shown include unvalidated device data.  Last Pain:  Vitals:   10/11/21 1227  TempSrc:   PainSc: 0-No pain         Complications: No notable events documented.

## 2021-10-11 NOTE — H&P (Signed)
GASTROENTEROLOGY PROCEDURE H&P NOTE   Primary Care Physician: Camillia Herter, NP  HPI: Katherine Henson is a 61 y.o. 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  Past Medical History:  Diagnosis Date   Allergy    Chronic hepatitis C ([place]) 12/06/2018   Depression    HTN (hypertension)    Hyperlipidemia    Stroke [facility])    Past Surgical History:  Procedure Laterality Date   ENDOSCOPIC MUCOSAL RESECTION  09/21/2020   Procedure: ENDOSCOPIC MUCOSAL RESECTION;  Surgeon: Irving Copas., MD;  Location: [facility];  Service: Gastroenterology;;   ESOPHAGOGASTRODUODENOSCOPY (EGD) WITH PROPOFOL N/A 09/21/2020   Procedure: ESOPHAGOGASTRODUODENOSCOPY (EGD) WITH PROPOFOL;  Surgeon: Irving Copas., MD;  Location: [facility];  Service: Gastroenterology;  Laterality: N/A;   HEMOSTASIS CLIP PLACEMENT  09/21/2020   Procedure: HEMOSTASIS CLIP PLACEMENT;  Surgeon: Rush Landmark Telford Nab., MD;  Location: [facility];  Service: Gastroenterology;;   NO PAST SURGERIES     SUBMUCOSAL LIFTING INJECTION  09/21/2020   Procedure: SUBMUCOSAL LIFTING INJECTION;  Surgeon: Irving Copas., MD;  Location: [facility];  Service: Gastroenterology;;   Current Facility-Administered Medications  Medication Dose Route Frequency Provider Last Rate Last Admin   0.9 %  sodium chloride infusion   Intravenous Continuous Mansouraty, Telford Nab., MD 20 mL/hr at 10/11/21 1031 New Bag at 10/11/21 1031    Current Facility-Administered Medications:    0.9 %  sodium chloride infusion, , Intravenous, Continuous, Mansouraty, Telford Nab., MD, Last Rate: 20 mL/hr at 10/11/21 1031, New Bag at 10/11/21 1031 Allergies  Allergen Reactions   Penicillins Anaphylaxis    Did it involve swelling of the face/tongue/throat, SOB, or low BP? No Did it involve sudden or severe rash/hives, skin peeling, or any reaction on the inside of your  mouth or nose? Yes Did you need to seek medical attention at a hospital or doctor's office? No When did it last happen?  30 years ago     If all above answers are "NO", may proceed with cephalosporin use.   Family History  Problem Relation Age of Onset   Stroke Mother    Anuerysm Mother        brain   Diabetes Sister    Breast cancer Sister    Breast cancer Sister    Diabetes Brother    Prostate cancer Brother    Breast cancer Niece    Breast cancer Niece    Colon cancer Neg Hx    Esophageal cancer Neg Hx    Liver disease Neg Hx    Stomach cancer Neg Hx    Pancreatic cancer Neg Hx    Inflammatory bowel disease Neg Hx    Rectal cancer Neg Hx    Social History   Socioeconomic History   Marital status: Single    Spouse name: Not on file   Number of children: 5   Years of education: Not on file   Highest education level: Not on file  Occupational History   Occupation: housekeeper  Tobacco Use   Smoking status: Former    Packs/day: 0.50    Years: 30.00    Total pack years: 15.00    Types: Cigarettes    Quit date: 12/06/2010    Years since quitting: 10.8   Smokeless tobacco: Never  Vaping Use   Vaping Use: Never used  Substance and Sexual Activity   Alcohol use: Yes    Alcohol/week: 1.0 standard  drink of alcohol    Types: 1 Glasses of wine per week    Comment: occasional   Drug use: Not Currently    Types: Marijuana   Sexual activity: Not Currently  Other Topics Concern   Not on file  Social History Narrative   Left handed   One story home   No caffeine   Social Determinants of Health   Financial Resource Strain: Not on file  Food Insecurity: Food Insecurity Present (02/03/2021)   Hunger Vital Sign    Worried About Running Out of Food in the Last Year: Often true    Ran Out of Food in the Last Year: Sometimes true  Transportation Needs: Unmet Transportation Needs (02/03/2021)   PRAPARE - Hydrologist (Medical): Yes    Lack of  Transportation (Non-Medical): Yes  Physical Activity: Not on file  Stress: Not on file  Social Connections: Not on file  Intimate Partner Violence: Not on file    Physical Exam: Today's Vitals   10/11/21 0955  BP: 127/66  Pulse: (!) 56  Resp: 18  Temp: (!) 97.2 F (36.2 C)  TempSrc: Temporal  SpO2: 98%  Weight: 67.6 kg  Height: 5' (1.524 m)  PainSc: 0-No pain   Body mass index is 29.1 kg/m. GEN: NAD EYE: Sclerae anicteric ENT: MMM CV: Non-tachycardic GI: Soft, NT/ND NEURO:  Alert & Oriented x 3  Lab Results: No results for input(s): "WBC", "HGB", "HCT", "PLT" in the last 72 hours. BMET No results for input(s): "NA", "K", "CL", "CO2", "GLUCOSE", "BUN", "CREATININE", "CALCIUM" in the last 72 hours. LFT No results for input(s): "PROT", "ALBUMIN", "AST", "ALT", "ALKPHOS", "BILITOT", "BILIDIR", "IBILI" in the last 72 hours. PT/INR No results for input(s): "LABPROT", "INR" in the last 72 hours.   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  The risks and benefits of endoscopic evaluation/treatment were discussed with the patient and/or family; these include but are not limited to the risk of perforation, infection, bleeding, missed lesions, lack of diagnosis, severe illness requiring hospitalization, as well as anesthesia and sedation related illnesses.  The patient's history has been reviewed, patient examined, no change in status, and deemed stable for procedure.  The patient and/or family is agreeable to proceed.    Justice Britain, MD [facility] Office # [phone]

## 2021-10-12 ENCOUNTER — Telehealth: Payer: Self-pay | Admitting: Gastroenterology

## 2021-10-12 ENCOUNTER — Encounter (HOSPITAL_COMMUNITY): Payer: Self-pay | Admitting: Gastroenterology

## 2021-10-12 ENCOUNTER — Encounter: Payer: Self-pay | Admitting: Gastroenterology

## 2021-10-12 LAB — SURGICAL PATHOLOGY

## 2021-10-12 MED ORDER — SUCRALFATE 1 G PO TABS: ORAL_TABLET | ORAL | 3 refills | Status: DC

## 2021-10-12 NOTE — Telephone Encounter (Signed)
Yes, I did talk with her and her family yesterday that if she was having issues with the cost of the Carafate that it could be transition to tablet form. Thanks. GM

## 2021-10-12 NOTE — Telephone Encounter (Signed)
Medication requests go to the [place] [facility] with Dr Rush Landmark.  I will forward to [place].

## 2021-10-12 NOTE — Telephone Encounter (Signed)
Is it Okay to send Carafate tablets to make slurry for this patient?

## 2021-10-12 NOTE — Telephone Encounter (Signed)
PT is calling about sucralfate. Was prescribed the liquid form and it is too expensive. Wants to have it in pill form if possible. Please reach out to advise.  Thank you

## 2021-10-12 NOTE — Telephone Encounter (Signed)
Spoke with patient and she is aware that carafate tablets have been sent to pharmacy.  Patient agreed to plan and verbalized understanding.  No further questions.

## 2021-10-25 NOTE — Progress Notes (Deleted)
Patient ID: Katherine Henson, female    DOB: [birth year]  MRN: [id number]  CC: Chronic Care Management  Subjective: Katherine Henson is a 61 y.o. female who presents for chronic care management.   Her concerns today include:  HTN -  Amlodipine, Losartan, and Metoprolol Tartrate  HLD - Simvastatin    Patient Active Problem List   Diagnosis Date Noted   Abnormal findings on esophagogastroduodenoscopy (EGD) 07/22/2020   Dysphagia 07/22/2020   Adenomatous duodenal polyp 54/27/0623   Helicobacter pylori infection 07/22/2020   Esophagitis determined by endoscopy 07/22/2020   History of cerebellar stroke 11/26/2019   Mixed hyperlipidemia 11/26/2019   Positive double stranded DNA antibody test 04/10/2019   Chronic hepatitis C without hepatic coma ([place]) 12/06/2018   HTN (hypertension) 04/30/2018     Current Outpatient Medications on File Prior to Visit  Medication Sig Dispense Refill   amLODipine (NORVASC) 10 MG tablet Take 1 tablet (10 mg total) by mouth daily. 120 tablet 0   aspirin 81 MG chewable tablet Chew 1 tablet (81 mg total) by mouth daily. 30 tablet 0   cyanocobalamin 2000 MCG tablet Take 2,000 mcg by mouth daily.     ferrous sulfate 325 (65 FE) MG tablet Take 325 mg by mouth daily with breakfast.     losartan (COZAAR) 50 MG tablet Take 1 tablet (50 mg total) by mouth daily. 120 tablet 0   metoprolol tartrate (LOPRESSOR) 25 MG tablet Take 1/2 tablet (12.5 mg total) by mouth twice daily. 120 tablet 0   pantoprazole (PROTONIX) 40 MG tablet Take 1 tablet (40 mg total) by mouth 2 (two) times daily. 180 tablet 3   simvastatin (ZOCOR) 40 MG tablet Take 1 tablet (40 mg total) by mouth daily at 6 PM. 90 tablet 1   sucralfate (CARAFATE) 1 g tablet Crush 1 tablet and dissolve in 26m of warm water, mix well to create slurry and drink two times daily. 60 tablet 3   No current facility-administered medications on file prior to visit.    Allergies  Allergen Reactions   Penicillins  Anaphylaxis    Did it involve swelling of the face/tongue/throat, SOB, or low BP? No Did it involve sudden or severe rash/hives, skin peeling, or any reaction on the inside of your mouth or nose? Yes Did you need to seek medical attention at a hospital or doctor's office? No When did it last happen?  30 years ago     If all above answers are "NO", may proceed with cephalosporin use.    Social History   Socioeconomic History   Marital status: Single    Spouse name: Not on file   Number of children: 5   Years of education: Not on file   Highest education level: Not on file  Occupational History   Occupation: housekeeper  Tobacco Use   Smoking status: Former    Packs/day: 0.50    Years: 30.00    Total pack years: 15.00    Types: Cigarettes    Quit date: 12/06/2010    Years since quitting: 10.8   Smokeless tobacco: Never  Vaping Use   Vaping Use: Never used  Substance and Sexual Activity   Alcohol use: Yes    Alcohol/week: 1.0 standard drink of alcohol    Types: 1 Glasses of wine per week    Comment: occasional   Drug use: Not Currently    Types: Marijuana   Sexual activity: Not Currently  Other Topics Concern   Not on  file  Social History Narrative   Left handed   One story home   No caffeine   Social Determinants of Health   Financial Resource Strain: Not on file  Food Insecurity: Food Insecurity Present (02/03/2021)   Hunger Vital Sign    Worried About Running Out of Food in the Last Year: Often true    Ran Out of Food in the Last Year: Sometimes true  Transportation Needs: Unmet Transportation Needs (02/03/2021)   PRAPARE - Hydrologist (Medical): Yes    Lack of Transportation (Non-Medical): Yes  Physical Activity: Not on file  Stress: Not on file  Social Connections: Not on file  Intimate Partner Violence: Not on file    Family History  Problem Relation Age of Onset   Stroke Mother    Anuerysm Mother        brain   Diabetes  Sister    Breast cancer Sister    Breast cancer Sister    Diabetes Brother    Prostate cancer Brother    Breast cancer Niece    Breast cancer Niece    Colon cancer Neg Hx    Esophageal cancer Neg Hx    Liver disease Neg Hx    Stomach cancer Neg Hx    Pancreatic cancer Neg Hx    Inflammatory bowel disease Neg Hx    Rectal cancer Neg Hx     Past Surgical History:  Procedure Laterality Date   ENDOSCOPIC MUCOSAL RESECTION  09/21/2020   Procedure: ENDOSCOPIC MUCOSAL RESECTION;  Surgeon: Rush Landmark, Telford Nab., MD;  Location: [facility];  Service: Gastroenterology;;   ENDOSCOPIC MUCOSAL RESECTION N/A 10/11/2021   Procedure: ENDOSCOPIC MUCOSAL RESECTION;  Surgeon: Irving Copas., MD;  Location: [facility];  Service: Gastroenterology;  Laterality: N/A;   ESOPHAGOGASTRODUODENOSCOPY (EGD) WITH PROPOFOL N/A 09/21/2020   Procedure: ESOPHAGOGASTRODUODENOSCOPY (EGD) WITH PROPOFOL;  Surgeon: Rush Landmark Telford Nab., MD;  Location: [facility];  Service: Gastroenterology;  Laterality: N/A;   ESOPHAGOGASTRODUODENOSCOPY (EGD) WITH PROPOFOL N/A 10/11/2021   Procedure: ESOPHAGOGASTRODUODENOSCOPY (EGD) WITH PROPOFOL;  Surgeon: Rush Landmark Telford Nab., MD;  Location: [facility];  Service: Gastroenterology;  Laterality: N/A;   HEMOSTASIS CLIP PLACEMENT  09/21/2020   Procedure: HEMOSTASIS CLIP PLACEMENT;  Surgeon: Irving Copas., MD;  Location: [facility];  Service: Gastroenterology;;   HEMOSTASIS CLIP PLACEMENT  10/11/2021   Procedure: HEMOSTASIS CLIP PLACEMENT;  Surgeon: Irving Copas., MD;  Location: [facility];  Service: Gastroenterology;;   NO PAST SURGERIES     POLYPECTOMY  10/11/2021   Procedure: POLYPECTOMY;  Surgeon: Irving Copas., MD;  Location: [facility];  Service: Gastroenterology;;   SUBMUCOSAL LIFTING INJECTION  09/21/2020   Procedure: SUBMUCOSAL LIFTING INJECTION;  Surgeon: Irving Copas., MD;  Location: [facility];  Service:  Gastroenterology;;   [place] INJECTION  10/11/2021   Procedure: SUBMUCOSAL LIFTING INJECTION;  Surgeon: Irving Copas., MD;  Location: [facility];  Service: Gastroenterology;;   SUBMUCOSAL TATTOO INJECTION  10/11/2021   Procedure: SUBMUCOSAL TATTOO INJECTION;  Surgeon: Irving Copas., MD;  [facility]: [facility];  Service: Gastroenterology;;    ROS: Review of Systems Negative except as stated above  PHYSICAL EXAM: LMP 09/27/2012   Physical Exam  {female adult master:310786} {female adult master:310785}     Latest Ref Rng & Units 07/09/2021    2:03 PM 12/14/2020   11:05 AM 07/21/2020    4:07 PM  CMP  Glucose 70 - 99 mg/dL 103  75  95   BUN  8 - 27 mg/dL '13  16  12   '$ Creatinine 0.57 - 1.00 mg/dL 0.91  0.89  0.88   Sodium 134 - 144 mmol/L 145  143  143   Potassium 3.5 - 5.2 mmol/L 4.2  4.4  3.7   Chloride 96 - 106 mmol/L 107  106  109   CO2 20 - 29 mmol/L '23  22  26   '$ Calcium 8.7 - 10.3 mg/dL 9.6  9.6  9.9    Lipid Panel     Component Value Date/Time   CHOL 194 11/26/2019 1417   TRIG 87 11/26/2019 1417   HDL 54 11/26/2019 1417   CHOLHDL 3.6 11/26/2019 1417   CHOLHDL 4.6 05/01/2018 0338   VLDL 52 (H) 05/01/2018 0338   LDLCALC 124 (H) 11/26/2019 1417    CBC    Component Value Date/Time   WBC 7.5 07/21/2020 1607   RBC 4.30 07/21/2020 1607   HGB 11.8 (L) 07/21/2020 1607   HGB 12.2 04/03/2019 1001   HCT 35.8 (L) 07/21/2020 1607   HCT 36.9 04/03/2019 1001   PLT 305.0 07/21/2020 1607   PLT 316 04/03/2019 1001   MCV 83.2 07/21/2020 1607   MCV 85 04/03/2019 1001   MCH 27.4 11/13/2019 1722   MCHC 32.9 07/21/2020 1607   RDW 15.8 (H) 07/21/2020 1607   RDW 14.8 04/03/2019 1001   LYMPHSABS 2.3 01/28/2019 1452   MONOABS 0.6 01/28/2019 1452   EOSABS 0.1 01/28/2019 1452   BASOSABS 0.0 01/28/2019 1452    ASSESSMENT AND PLAN:  There are no diagnoses linked to this encounter.   Patient was given the opportunity to ask questions.  Patient  verbalized understanding of the plan and was able to repeat key elements of the plan. Patient was given clear instructions to go to Emergency Department or return to medical center if symptoms don't improve, worsen, or new problems develop.The patient verbalized understanding.   No orders of the defined types were placed in this encounter.    Requested Prescriptions    No prescriptions requested or ordered in this encounter    No follow-ups on file.  Camillia Herter, NP

## 2021-11-01 ENCOUNTER — Other Ambulatory Visit: Payer: Self-pay | Admitting: Family

## 2021-11-01 DIAGNOSIS — I10 Essential (primary) hypertension: Secondary | ICD-10-CM

## 2021-11-01 NOTE — Progress Notes (Signed)
Patient ID: Katherine Henson, female    DOB: 1960/06/12  MRN: [id number]  CC: Chronic Care Management  Subjective: Katherine Henson is a 61 y.o. female who presents for chronic care management.   Her concerns today include:  - Doing well on blood pressure and cholesterol medications without issues or concerns.  - Left foot swelling and discomfort began 1 week ago and worsening the past 2 days. Pain 8/10. Using Tylenol. Denies red flag symptoms such as but not limited to chest pain and shortness of breath.    Patient Active Problem List   Diagnosis Date Noted   Abnormal findings on esophagogastroduodenoscopy (EGD) 07/22/2020   Dysphagia 07/22/2020   Adenomatous duodenal polyp 82/99/3716   Helicobacter pylori infection 07/22/2020   Esophagitis determined by endoscopy 07/22/2020   History of cerebellar stroke 11/26/2019   Mixed hyperlipidemia 11/26/2019   Positive double stranded DNA antibody test 04/10/2019   Chronic hepatitis C without hepatic coma ([place]) 12/06/2018   HTN (hypertension) 04/30/2018     Current Outpatient Medications on File Prior to Visit  Medication Sig Dispense Refill   aspirin 81 MG chewable tablet Chew 1 tablet (81 mg total) by mouth daily. 30 tablet 0   cyanocobalamin 2000 MCG tablet Take 2,000 mcg by mouth daily.     ferrous sulfate 325 (65 FE) MG tablet Take 325 mg by mouth daily with breakfast.     losartan (COZAAR) 50 MG tablet Take 1 tablet by mouth once daily 90 tablet 0   metoprolol tartrate (LOPRESSOR) 25 MG tablet Take 1/2 tablet (12.5 mg total) by mouth twice daily. 120 tablet 0   pantoprazole (PROTONIX) 40 MG tablet Take 1 tablet (40 mg total) by mouth 2 (two) times daily. 180 tablet 3   sucralfate (CARAFATE) 1 g tablet Crush 1 tablet and dissolve in 70m of warm water, mix well to create slurry and drink two times daily. 60 tablet 3   No current facility-administered medications on file prior to visit.    Allergies  Allergen Reactions    Penicillins Anaphylaxis    Did it involve swelling of the face/tongue/throat, SOB, or low BP? No Did it involve sudden or severe rash/hives, skin peeling, or any reaction on the inside of your mouth or nose? Yes Did you need to seek medical attention at a hospital or doctor's office? No When did it last happen?  30 years ago     If all above answers are "NO", may proceed with cephalosporin use.    Social History   Socioeconomic History   Marital status: Single    Spouse name: Not on file   Number of children: 5   Years of education: Not on file   Highest education level: Not on file  Occupational History   Occupation: housekeeper  Tobacco Use   Smoking status: Former    Packs/day: 0.50    Years: 30.00    Total pack years: 15.00    Types: Cigarettes    Quit date: 12/06/2010    Years since quitting: 10.9   Smokeless tobacco: Never  Vaping Use   Vaping Use: Never used  Substance and Sexual Activity   Alcohol use: Yes    Alcohol/week: 1.0 standard drink of alcohol    Types: 1 Glasses of wine per week    Comment: occasional   Drug use: Not Currently    Types: Marijuana   Sexual activity: Not Currently  Other Topics Concern   Not on file  Social History Narrative  Left handed   One story home   No caffeine   Social Determinants of Health   Financial Resource Strain: Not on file  Food Insecurity: Food Insecurity Present (02/03/2021)   Hunger Vital Sign    Worried About Running Out of Food in the Last Year: Often true    Ran Out of Food in the Last Year: Sometimes true  Transportation Needs: Unmet Transportation Needs (02/03/2021)   PRAPARE - Hydrologist (Medical): Yes    Lack of Transportation (Non-Medical): Yes  Physical Activity: Not on file  Stress: Not on file  Social Connections: Not on file  Intimate Partner Violence: Not on file    Family History  Problem Relation Age of Onset   Stroke Mother    Anuerysm Mother        brain    Diabetes Sister    Breast cancer Sister    Breast cancer Sister    Diabetes Brother    Prostate cancer Brother    Breast cancer Niece    Breast cancer Niece    Colon cancer Neg Hx    Esophageal cancer Neg Hx    Liver disease Neg Hx    Stomach cancer Neg Hx    Pancreatic cancer Neg Hx    Inflammatory bowel disease Neg Hx    Rectal cancer Neg Hx     Past Surgical History:  Procedure Laterality Date   ENDOSCOPIC MUCOSAL RESECTION  09/21/2020   Procedure: ENDOSCOPIC MUCOSAL RESECTION;  Surgeon: Rush Landmark, Telford Nab., MD;  Location: [facility];  Service: Gastroenterology;;   ENDOSCOPIC MUCOSAL RESECTION N/A 10/11/2021   Procedure: ENDOSCOPIC MUCOSAL RESECTION;  Surgeon: Irving Copas., MD;  Location: [facility];  Service: Gastroenterology;  Laterality: N/A;   ESOPHAGOGASTRODUODENOSCOPY (EGD) WITH PROPOFOL N/A 09/21/2020   Procedure: ESOPHAGOGASTRODUODENOSCOPY (EGD) WITH PROPOFOL;  Surgeon: Rush Landmark Telford Nab., MD;  Location: [facility];  Service: Gastroenterology;  Laterality: N/A;   ESOPHAGOGASTRODUODENOSCOPY (EGD) WITH PROPOFOL N/A 10/11/2021   Procedure: ESOPHAGOGASTRODUODENOSCOPY (EGD) WITH PROPOFOL;  Surgeon: Rush Landmark Telford Nab., MD;  Location: [facility];  Service: Gastroenterology;  Laterality: N/A;   HEMOSTASIS CLIP PLACEMENT  09/21/2020   Procedure: HEMOSTASIS CLIP PLACEMENT;  Surgeon: Irving Copas., MD;  Location: [facility];  Service: Gastroenterology;;   HEMOSTASIS CLIP PLACEMENT  10/11/2021   Procedure: HEMOSTASIS CLIP PLACEMENT;  Surgeon: Irving Copas., MD;  Location: [facility];  Service: Gastroenterology;;   NO PAST SURGERIES     POLYPECTOMY  10/11/2021   Procedure: POLYPECTOMY;  Surgeon: Irving Copas., MD;  Location: [facility];  Service: Gastroenterology;;   SUBMUCOSAL LIFTING INJECTION  09/21/2020   Procedure: SUBMUCOSAL LIFTING INJECTION;  Surgeon: Irving Copas., MD;  Location: [facility];  Service:  Gastroenterology;;   [place] INJECTION  10/11/2021   Procedure: SUBMUCOSAL LIFTING INJECTION;  Surgeon: Irving Copas., MD;  Location: [facility];  Service: Gastroenterology;;   SUBMUCOSAL TATTOO INJECTION  10/11/2021   Procedure: SUBMUCOSAL TATTOO INJECTION;  Surgeon: Irving Copas., MD;  Location: [facility];  Service: Gastroenterology;;    ROS: Review of Systems Negative except as stated above  PHYSICAL EXAM: BP 105/70   Pulse 61   Temp 98.6 F (37 C) (Oral)   Resp 16   Ht 5' (1.524 m)   Wt 146 lb (66.2 kg)   LMP 09/27/2012   SpO2 94%   BMI 28.51 kg/m    Physical Exam HENT:     Head: Normocephalic and atraumatic.  Eyes:  Extraocular Movements: Extraocular movements intact.     Conjunctiva/sclera: Conjunctivae normal.     Pupils: Pupils are equal, round, and reactive to light.  Cardiovascular:     Rate and Rhythm: Normal rate and regular rhythm.     Pulses: Normal pulses.     Heart sounds: Normal heart sounds.  Pulmonary:     Effort: Pulmonary effort is normal.     Breath sounds: Normal breath sounds.  Musculoskeletal:     Cervical back: Normal range of motion and neck supple.     Right lower leg: Normal.     Left lower leg: Normal.     Right ankle: Normal.     Left ankle: Normal.     Right foot: Normal.     Left foot: Swelling and tenderness present.  Neurological:     General: No focal deficit present.     Mental Status: She is alert and oriented to person, place, and time.  Psychiatric:        Mood and Affect: Mood normal.        Behavior: Behavior normal.    ASSESSMENT AND PLAN: 1. Essential (primary) hypertension - Continue Losartan and Metoprolol Tartrate as prescribed. No refills needed as of present.  - Continue Amlodipine as prescribed.  - Counseled on blood pressure goal of less than 130/80, low-sodium, DASH diet, medication compliance, and 150 minutes of moderate intensity exercise per week as tolerated. Counseled  on medication adherence and adverse effects. - Follow-up with primary provider in 3 months or sooner if needed.  - amLODipine (NORVASC) 10 MG tablet; Take 1 tablet (10 mg total) by mouth daily.  Dispense: 30 tablet; Refill: 2  2. Mixed hyperlipidemia - Continue Simvastatin as prescribed.  - Follow-up with primary provider as scheduled.  - simvastatin (ZOCOR) 40 MG tablet; Take 1 tablet (40 mg total) by mouth daily at 6 PM.  Dispense: 30 tablet; Refill: 2  3. Localized swelling of left foot 4. Pain of left lower extremity - Furosemide as prescribed. Counseled on medication adherence and adverse effects.  - Vascular ultrasound lower extremity venous for further evaluation.  - Follow-up with primary care as scheduled.  - VAS [place] LOWER EXTREMITY VENOUS (DVT); Future - furosemide (LASIX) 20 MG tablet; Take 1 tablet (20 mg total) by mouth daily for 4 days.  Dispense: 4 tablet; Refill: 0    Patient was given the opportunity to ask questions.  Patient verbalized understanding of the plan and was able to repeat key elements of the plan. Patient was given clear instructions to go to Emergency Department or return to medical center if symptoms don't improve, worsen, or new problems develop.The patient verbalized understanding.   Orders Placed This Encounter  Procedures   VAS [place] LOWER EXTREMITY VENOUS (DVT)     Requested Prescriptions   Signed Prescriptions Disp Refills   simvastatin (ZOCOR) 40 MG tablet 30 tablet 2    Sig: Take 1 tablet (40 mg total) by mouth daily at 6 PM.   amLODipine (NORVASC) 10 MG tablet 30 tablet 2    Sig: Take 1 tablet (10 mg total) by mouth daily.   furosemide (LASIX) 20 MG tablet 4 tablet 0    Sig: Take 1 tablet (20 mg total) by mouth daily for 4 days.    Return in about 3 months (around 02/08/2022) for Follow-Up or next available chronic care mgmt .  Camillia Herter, NP

## 2021-11-02 ENCOUNTER — Ambulatory Visit: Payer: Self-pay | Admitting: Family

## 2021-11-02 DIAGNOSIS — I10 Essential (primary) hypertension: Secondary | ICD-10-CM

## 2021-11-02 DIAGNOSIS — E782 Mixed hyperlipidemia: Secondary | ICD-10-CM

## 2021-11-02 NOTE — Telephone Encounter (Signed)
Requested Prescriptions  Pending Prescriptions Disp Refills  . losartan (COZAAR) 50 MG tablet [Pharmacy Med Name: Losartan Potassium 50 MG Oral Tablet] 90 tablet 0    Sig: Take 1 tablet by mouth once daily     Cardiovascular:  Angiotensin Receptor Blockers Passed - 11/01/2021  5:06 PM      Passed - Cr in normal range and within 180 days    Creat  Date Value Ref Range Status  08/19/2019 0.96 0.50 - 1.05 mg/dL Final    Comment:    For patients >62 years of age, the reference limit for Creatinine is approximately 13% higher for people identified as African-American. .    Creatinine, Ser  Date Value Ref Range Status  07/09/2021 0.91 0.57 - 1.00 mg/dL Final         Passed - K in normal range and within 180 days    Potassium  Date Value Ref Range Status  07/09/2021 4.2 3.5 - 5.2 mmol/L Final         Passed - Patient is not pregnant      Passed - Last BP in normal range    BP Readings from Last 1 Encounters:  10/11/21 124/69         Passed - Valid encounter within last 6 months    Recent Outpatient Visits          3 months ago Essential (primary) hypertension   Primary Care at [facility], Amy J, NP   7 months ago Essential hypertension   Primary Care at [facility], Amy J, NP   10 months ago Essential hypertension   Primary Care at [facility], Amy J, NP   1 year ago Subcutaneous mass of right hand   Primary Care at [facility], Loraine Grip, PA-C   1 year ago Essential hypertension   Primary Care at [facility], Flonnie Hailstone, NP      Future Appointments            In 1 week Camillia Herter, NP Primary Care at [facility]

## 2021-11-05 ENCOUNTER — Other Ambulatory Visit: Payer: Self-pay

## 2021-11-05 ENCOUNTER — Ambulatory Visit: Payer: Self-pay | Admitting: Family

## 2021-11-05 DIAGNOSIS — I10 Essential (primary) hypertension: Secondary | ICD-10-CM

## 2021-11-05 MED ORDER — METOPROLOL TARTRATE 25 MG PO TABS: ORAL_TABLET | ORAL | 0 refills | Status: DC

## 2021-11-09 ENCOUNTER — Ambulatory Visit (INDEPENDENT_AMBULATORY_CARE_PROVIDER_SITE_OTHER): Payer: Self-pay | Admitting: Family

## 2021-11-09 ENCOUNTER — Encounter: Payer: Self-pay | Admitting: Family

## 2021-11-09 VITALS — BP 105/70 | HR 61 | Temp 98.6°F | Resp 16 | Ht 60.0 in | Wt 146.0 lb

## 2021-11-09 DIAGNOSIS — E782 Mixed hyperlipidemia: Secondary | ICD-10-CM

## 2021-11-09 DIAGNOSIS — I10 Essential (primary) hypertension: Secondary | ICD-10-CM

## 2021-11-09 DIAGNOSIS — M79605 Pain in left leg: Secondary | ICD-10-CM

## 2021-11-09 DIAGNOSIS — R2242 Localized swelling, mass and lump, left lower limb: Secondary | ICD-10-CM

## 2021-11-09 MED ORDER — FUROSEMIDE 20 MG PO TABS: 20.0000 mg | ORAL_TABLET | Freq: Every day | ORAL | 0 refills | Status: DC

## 2021-11-09 MED ORDER — SIMVASTATIN 40 MG PO TABS: 40.0000 mg | ORAL_TABLET | Freq: Every day | ORAL | 2 refills | Status: DC

## 2021-11-09 MED ORDER — AMLODIPINE BESYLATE 10 MG PO TABS: 10.0000 mg | ORAL_TABLET | Freq: Every day | ORAL | 2 refills | Status: DC

## 2021-11-09 NOTE — Progress Notes (Signed)
Left foot swelling and discomfort Pain 8/10  Progressing x 1 week over 2 days  OTC Tylenol

## 2021-11-10 ENCOUNTER — Other Ambulatory Visit: Payer: Self-pay

## 2021-11-10 DIAGNOSIS — I10 Essential (primary) hypertension: Secondary | ICD-10-CM

## 2021-11-10 MED ORDER — LOSARTAN POTASSIUM 50 MG PO TABS: 50.0000 mg | ORAL_TABLET | Freq: Every day | ORAL | 0 refills | Status: DC

## 2021-11-10 NOTE — Progress Notes (Signed)
Att to contact pt to confirm [facility]artan [facility]

## 2021-11-11 ENCOUNTER — Ambulatory Visit (HOSPITAL_COMMUNITY)
Admission: RE | Admit: 2021-11-11 | Discharge: 2021-11-11 | Disposition: A | Discharge status: Home / Self Care | Payer: Commercial Managed Care - HMO | Source: Ambulatory Visit | Attending: Family | Admitting: Family

## 2021-11-11 ENCOUNTER — Telehealth: Payer: Self-pay

## 2021-11-11 DIAGNOSIS — M79605 Pain in left leg: Secondary | ICD-10-CM | POA: Insufficient documentation

## 2021-11-11 DIAGNOSIS — R2242 Localized swelling, mass and lump, left lower limb: Secondary | ICD-10-CM | POA: Diagnosis present

## 2021-11-11 NOTE — Telephone Encounter (Signed)
Katherine Henson with [facility] reports pt. Left Lower study for DVT is negative. Results will be in Epic.

## 2021-11-11 NOTE — Progress Notes (Signed)
Left lower extremity venous duplex has been completed. Preliminary results can be found in CV Proc through chart review.  Results were given to [facility] at Durene Fruits' NP office.  11/11/21 9:38 AM Carlos Levering RVT

## 2021-11-12 ENCOUNTER — Ambulatory Visit (HOSPITAL_COMMUNITY): Payer: Commercial Managed Care - HMO

## 2022-01-10 ENCOUNTER — Other Ambulatory Visit: Payer: Self-pay | Admitting: Family

## 2022-01-10 DIAGNOSIS — Z1231 Encounter for screening mammogram for malignant neoplasm of breast: Secondary | ICD-10-CM

## 2022-01-28 ENCOUNTER — Other Ambulatory Visit: Payer: Self-pay | Admitting: Family

## 2022-01-28 DIAGNOSIS — I10 Essential (primary) hypertension: Secondary | ICD-10-CM

## 2022-01-31 NOTE — Telephone Encounter (Signed)
Requested Prescriptions  Pending Prescriptions Disp Refills   losartan (COZAAR) 50 MG tablet [Pharmacy Med Name: Losartan Potassium 50 MG Oral Tablet] 90 tablet 0    Sig: Take 1 tablet by mouth once daily     Cardiovascular:  Angiotensin Receptor Blockers Failed - 01/28/2022  6:34 AM      Failed - Cr in normal range and within 180 days    Creat  Date Value Ref Range Status  08/19/2019 0.96 0.50 - 1.05 mg/dL Final    Comment:    For patients >62 years of age, the reference limit for Creatinine is approximately 13% higher for people identified as African-American. .    Creatinine, Ser  Date Value Ref Range Status  07/09/2021 0.91 0.57 - 1.00 mg/dL Final         Failed - K in normal range and within 180 days    Potassium  Date Value Ref Range Status  07/09/2021 4.2 3.5 - 5.2 mmol/L Final         Passed - Patient is not pregnant      Passed - Last BP in normal range    BP Readings from Last 1 Encounters:  11/09/21 105/70         Passed - Valid encounter within last 6 months    Recent Outpatient Visits           2 months ago Essential (primary) hypertension   Primary Care at [facility], Amy J, NP   6 months ago Essential (primary) hypertension   Primary Care at [facility], Amy J, NP   10 months ago Essential hypertension   Primary Care at [facility], Amy J, NP   1 year ago Essential hypertension   Primary Care at [facility], Amy J, NP   1 year ago Subcutaneous mass of right hand   Primary Care at [facility], Loraine Grip, PA-C       Future Appointments             In 2 weeks Camillia Herter, NP Primary Care at [facility]

## 2022-02-04 ENCOUNTER — Other Ambulatory Visit: Payer: Self-pay | Admitting: Family

## 2022-02-04 DIAGNOSIS — I10 Essential (primary) hypertension: Secondary | ICD-10-CM

## 2022-02-07 NOTE — Progress Notes (Deleted)
Patient ID: Katherine Henson, female    DOB: Apr 11, 1960  MRN: [id number]  CC: Chronic Care Management  Subjective: Katherine Henson is a 61 y.o. female who presents for chronic care management.   Her concerns today include:  HTN - Amlodipine, Losartan, Metoprolol Tartrate  HLD - Simvastatin    Patient Active Problem List   Diagnosis Date Noted   Abnormal findings on esophagogastroduodenoscopy (EGD) 07/22/2020   Dysphagia 07/22/2020   Adenomatous duodenal polyp 81/82/9937   Helicobacter pylori infection 07/22/2020   Esophagitis determined by endoscopy 07/22/2020   History of cerebellar stroke 11/26/2019   Mixed hyperlipidemia 11/26/2019   Positive double stranded DNA antibody test 04/10/2019   Chronic hepatitis C without hepatic coma ([place]) 12/06/2018   HTN (hypertension) 04/30/2018     Current Outpatient Medications on File Prior to Visit  Medication Sig Dispense Refill   amLODipine (NORVASC) 10 MG tablet Take 1 tablet (10 mg total) by mouth daily. 30 tablet 2   aspirin 81 MG chewable tablet Chew 1 tablet (81 mg total) by mouth daily. 30 tablet 0   cyanocobalamin 2000 MCG tablet Take 2,000 mcg by mouth daily.     ferrous sulfate 325 (65 FE) MG tablet Take 325 mg by mouth daily with breakfast.     furosemide (LASIX) 20 MG tablet Take 1 tablet (20 mg total) by mouth daily for 4 days. 4 tablet 0   losartan (COZAAR) 50 MG tablet Take 1 tablet by mouth once daily 90 tablet 0   metoprolol tartrate (LOPRESSOR) 25 MG tablet Take 1/2 (one-half) tablet by mouth twice daily 120 tablet 0   pantoprazole (PROTONIX) 40 MG tablet Take 1 tablet (40 mg total) by mouth 2 (two) times daily. 180 tablet 3   simvastatin (ZOCOR) 40 MG tablet Take 1 tablet (40 mg total) by mouth daily at 6 PM. 30 tablet 2   sucralfate (CARAFATE) 1 g tablet Crush 1 tablet and dissolve in 65m of warm water, mix well to create slurry and drink two times daily. 60 tablet 3   No current facility-administered medications  on file prior to visit.    Allergies  Allergen Reactions   Penicillins Anaphylaxis    Did it involve swelling of the face/tongue/throat, SOB, or low BP? No Did it involve sudden or severe rash/hives, skin peeling, or any reaction on the inside of your mouth or nose? Yes Did you need to seek medical attention at a hospital or doctor's office? No When did it last happen?  30 years ago     If all above answers are "NO", may proceed with cephalosporin use.    Social History   Socioeconomic History   Marital status: Single    Spouse name: Not on file   Number of children: 5   Years of education: Not on file   Highest education level: Not on file  Occupational History   Occupation: housekeeper  Tobacco Use   Smoking status: Former    Packs/day: 0.50    Years: 30.00    Total pack years: 15.00    Types: Cigarettes    Quit date: 12/06/2010    Years since quitting: 11.1   Smokeless tobacco: Never  Vaping Use   Vaping Use: Never used  Substance and Sexual Activity   Alcohol use: Yes    Alcohol/week: 1.0 standard drink of alcohol    Types: 1 Glasses of wine per week    Comment: occasional   Drug use: Not Currently  Types: Marijuana   Sexual activity: Not Currently  Other Topics Concern   Not on file  Social History Narrative   Left handed   One story home   No caffeine   Social Determinants of Health   Financial Resource Strain: Not on file  Food Insecurity: Food Insecurity Present (02/03/2021)   Hunger Vital Sign    Worried About Running Out of Food in the Last Year: Often true    Ran Out of Food in the Last Year: Sometimes true  Transportation Needs: Unmet Transportation Needs (02/03/2021)   PRAPARE - Hydrologist (Medical): Yes    Lack of Transportation (Non-Medical): Yes  Physical Activity: Not on file  Stress: Not on file  Social Connections: Not on file  Intimate Partner Violence: Not on file    Family History  Problem Relation  Age of Onset   Stroke Mother    Anuerysm Mother        brain   Diabetes Sister    Breast cancer Sister    Breast cancer Sister    Diabetes Brother    Prostate cancer Brother    Breast cancer Niece    Breast cancer Niece    Colon cancer Neg Hx    Esophageal cancer Neg Hx    Liver disease Neg Hx    Stomach cancer Neg Hx    Pancreatic cancer Neg Hx    Inflammatory bowel disease Neg Hx    Rectal cancer Neg Hx     Past Surgical History:  Procedure Laterality Date   ENDOSCOPIC MUCOSAL RESECTION  09/21/2020   Procedure: ENDOSCOPIC MUCOSAL RESECTION;  Surgeon: Rush Landmark, Telford Nab., MD;  Location: [facility];  Service: Gastroenterology;;   ENDOSCOPIC MUCOSAL RESECTION N/A 10/11/2021   Procedure: ENDOSCOPIC MUCOSAL RESECTION;  Surgeon: Irving Copas., MD;  Location: [facility];  Service: Gastroenterology;  Laterality: N/A;   ESOPHAGOGASTRODUODENOSCOPY (EGD) WITH PROPOFOL N/A 09/21/2020   Procedure: ESOPHAGOGASTRODUODENOSCOPY (EGD) WITH PROPOFOL;  Surgeon: Rush Landmark Telford Nab., MD;  Location: [facility];  Service: Gastroenterology;  Laterality: N/A;   ESOPHAGOGASTRODUODENOSCOPY (EGD) WITH PROPOFOL N/A 10/11/2021   Procedure: ESOPHAGOGASTRODUODENOSCOPY (EGD) WITH PROPOFOL;  Surgeon: Rush Landmark Telford Nab., MD;  Location: [facility];  Service: Gastroenterology;  Laterality: N/A;   HEMOSTASIS CLIP PLACEMENT  09/21/2020   Procedure: HEMOSTASIS CLIP PLACEMENT;  Surgeon: Irving Copas., MD;  Location: [facility];  Service: Gastroenterology;;   HEMOSTASIS CLIP PLACEMENT  10/11/2021   Procedure: HEMOSTASIS CLIP PLACEMENT;  Surgeon: Irving Copas., MD;  Location: [facility];  Service: Gastroenterology;;   NO PAST SURGERIES     POLYPECTOMY  10/11/2021   Procedure: POLYPECTOMY;  Surgeon: Irving Copas., MD;  Location: [facility];  Service: Gastroenterology;;   SUBMUCOSAL LIFTING INJECTION  09/21/2020   Procedure: SUBMUCOSAL LIFTING INJECTION;  Surgeon:  Irving Copas., MD;  Location: [facility];  Service: Gastroenterology;;   [place] INJECTION  10/11/2021   Procedure: SUBMUCOSAL LIFTING INJECTION;  Surgeon: Irving Copas., MD;  Location: [facility];  Service: Gastroenterology;;   SUBMUCOSAL TATTOO INJECTION  10/11/2021   Procedure: SUBMUCOSAL TATTOO INJECTION;  Surgeon: Irving Copas., MD;  Location: [facility];  Service: Gastroenterology;;    ROS: Review of Systems Negative except as stated above  PHYSICAL EXAM: LMP 09/27/2012   Physical Exam  {female adult master:310786} {female adult master:310785}     Latest Ref Rng & Units 07/09/2021    2:03 PM 12/14/2020   11:05 AM 07/21/2020    4:07 PM  CMP  Glucose 70 - 99 mg/dL 103  75  95   BUN 8 - 27 mg/dL '13  16  12   '$ Creatinine 0.57 - 1.00 mg/dL 0.91  0.89  0.88   Sodium 134 - 144 mmol/L 145  143  143   Potassium 3.5 - 5.2 mmol/L 4.2  4.4  3.7   Chloride 96 - 106 mmol/L 107  106  109   CO2 20 - 29 mmol/L '23  22  26   '$ Calcium 8.7 - 10.3 mg/dL 9.6  9.6  9.9    Lipid Panel     Component Value Date/Time   CHOL 194 11/26/2019 1417   TRIG 87 11/26/2019 1417   HDL 54 11/26/2019 1417   CHOLHDL 3.6 11/26/2019 1417   CHOLHDL 4.6 05/01/2018 0338   VLDL 52 (H) 05/01/2018 0338   LDLCALC 124 (H) 11/26/2019 1417    CBC    Component Value Date/Time   WBC 7.5 07/21/2020 1607   RBC 4.30 07/21/2020 1607   HGB 11.8 (L) 07/21/2020 1607   HGB 12.2 04/03/2019 1001   HCT 35.8 (L) 07/21/2020 1607   HCT 36.9 04/03/2019 1001   PLT 305.0 07/21/2020 1607   PLT 316 04/03/2019 1001   MCV 83.2 07/21/2020 1607   MCV 85 04/03/2019 1001   MCH 27.4 11/13/2019 1722   MCHC 32.9 07/21/2020 1607   RDW 15.8 (H) 07/21/2020 1607   RDW 14.8 04/03/2019 1001   LYMPHSABS 2.3 01/28/2019 1452   MONOABS 0.6 01/28/2019 1452   EOSABS 0.1 01/28/2019 1452   BASOSABS 0.0 01/28/2019 1452    ASSESSMENT AND PLAN:  There are no diagnoses linked to this  encounter.   Patient was given the opportunity to ask questions.  Patient verbalized understanding of the plan and was able to repeat key elements of the plan. Patient was given clear instructions to go to Emergency Department or return to medical center if symptoms don't improve, worsen, or new problems develop.The patient verbalized understanding.   No orders of the defined types were placed in this encounter.    Requested Prescriptions    No prescriptions requested or ordered in this encounter    No follow-ups on file.  Camillia Herter, NP

## 2022-02-09 ENCOUNTER — Other Ambulatory Visit: Payer: Self-pay | Admitting: Gastroenterology

## 2022-02-09 ENCOUNTER — Other Ambulatory Visit: Payer: Self-pay | Admitting: Family

## 2022-02-09 DIAGNOSIS — I10 Essential (primary) hypertension: Secondary | ICD-10-CM

## 2022-02-14 ENCOUNTER — Ambulatory Visit: Payer: Commercial Managed Care - HMO | Admitting: Family

## 2022-02-14 DIAGNOSIS — E782 Mixed hyperlipidemia: Secondary | ICD-10-CM

## 2022-02-14 DIAGNOSIS — I10 Essential (primary) hypertension: Secondary | ICD-10-CM

## 2022-02-15 ENCOUNTER — Ambulatory Visit: Payer: Commercial Managed Care - HMO

## 2022-02-16 NOTE — Progress Notes (Signed)
Patient ID: Katherine Henson, female    DOB: 12/17/1960  MRN: [id number]  CC: Chronic Care Management  Subjective: Katherine Henson is a 61 y.o. female who presents for chronic care management.   Her concerns today include:  Doing well on chronic care medications, no issues/concerns. History right eye cataract. Recently worsening with blurry vision right eye. Request referral to eye doctor.   Patient Active Problem List   Diagnosis Date Noted   Abnormal findings on esophagogastroduodenoscopy (EGD) 07/22/2020   Dysphagia 07/22/2020   Adenomatous duodenal polyp 50/11/3816   Helicobacter pylori infection 07/22/2020   Esophagitis determined by endoscopy 07/22/2020   History of cerebellar stroke 11/26/2019   Mixed hyperlipidemia 11/26/2019   Positive double stranded DNA antibody test 04/10/2019   Chronic hepatitis C without hepatic coma ([place]) 12/06/2018   HTN (hypertension) 04/30/2018     Current Outpatient Medications on File Prior to Visit  Medication Sig Dispense Refill   amLODipine (NORVASC) 10 MG tablet Take 1 tablet by mouth once daily 90 tablet 0   aspirin 81 MG chewable tablet Chew 1 tablet (81 mg total) by mouth daily. 30 tablet 0   cyanocobalamin 2000 MCG tablet Take 2,000 mcg by mouth daily.     ferrous sulfate 325 (65 FE) MG tablet Take 325 mg by mouth daily with breakfast.     furosemide (LASIX) 20 MG tablet Take 1 tablet (20 mg total) by mouth daily for 4 days. 4 tablet 0   losartan (COZAAR) 50 MG tablet Take 1 tablet by mouth once daily 90 tablet 0   metoprolol tartrate (LOPRESSOR) 25 MG tablet Take 1/2 (one-half) tablet by mouth twice daily 120 tablet 0   pantoprazole (PROTONIX) 40 MG tablet Take 1 tablet (40 mg total) by mouth 2 (two) times daily. 180 tablet 3   simvastatin (ZOCOR) 40 MG tablet Take 1 tablet (40 mg total) by mouth daily at 6 PM. 30 tablet 2   sucralfate (CARAFATE) 1 g tablet CRUSH 1 TABLET AND DISSOLVE IN 10 ML OF WARM WATER, MIX WELL TO CREATE SLURRY  AND DRINK 2 TIMES DAILY 60 tablet 0   No current facility-administered medications on file prior to visit.    Allergies  Allergen Reactions   Penicillins Anaphylaxis    Did it involve swelling of the face/tongue/throat, SOB, or low BP? No Did it involve sudden or severe rash/hives, skin peeling, or any reaction on the inside of your mouth or nose? Yes Did you need to seek medical attention at a hospital or doctor's office? No When did it last happen?  30 years ago     If all above answers are "NO", may proceed with cephalosporin use.    Social History   Socioeconomic History   Marital status: Single    Spouse name: Not on file   Number of children: 5   Years of education: Not on file   Highest education level: Not on file  Occupational History   Occupation: housekeeper  Tobacco Use   Smoking status: Former    Packs/day: 0.50    Years: 30.00    Total pack years: 15.00    Types: Cigarettes    Quit date: 12/06/2010    Years since quitting: 11.2    Passive exposure: Past   Smokeless tobacco: Never  Vaping Use   Vaping Use: Never used  Substance and Sexual Activity   Alcohol use: Yes    Alcohol/week: 1.0 standard drink of alcohol    Types: 1 Glasses  of wine per week    Comment: occasional   Drug use: Not Currently    Types: Marijuana   Sexual activity: Not Currently  Other Topics Concern   Not on file  Social History Narrative   Left handed   One story home   No caffeine   Social Determinants of Health   Financial Resource Strain: Not on file  Food Insecurity: Food Insecurity Present (02/03/2021)   Hunger Vital Sign    Worried About Running Out of Food in the Last Year: Often true    Ran Out of Food in the Last Year: Sometimes true  Transportation Needs: Unmet Transportation Needs (02/03/2021)   PRAPARE - Hydrologist (Medical): Yes    Lack of Transportation (Non-Medical): Yes  Physical Activity: Not on file  Stress: Not on file   Social Connections: Not on file  Intimate Partner Violence: Not on file    Family History  Problem Relation Age of Onset   Stroke Mother    Anuerysm Mother        brain   Diabetes Sister    Breast cancer Sister    Breast cancer Sister    Diabetes Brother    Prostate cancer Brother    Breast cancer Niece    Breast cancer Niece    Colon cancer Neg Hx    Esophageal cancer Neg Hx    Liver disease Neg Hx    Stomach cancer Neg Hx    Pancreatic cancer Neg Hx    Inflammatory bowel disease Neg Hx    Rectal cancer Neg Hx     Past Surgical History:  Procedure Laterality Date   ENDOSCOPIC MUCOSAL RESECTION  09/21/2020   Procedure: ENDOSCOPIC MUCOSAL RESECTION;  Surgeon: Rush Landmark, Telford Nab., MD;  Location: [facility];  Service: Gastroenterology;;   ENDOSCOPIC MUCOSAL RESECTION N/A 10/11/2021   Procedure: ENDOSCOPIC MUCOSAL RESECTION;  Surgeon: Irving Copas., MD;  Location: [facility];  Service: Gastroenterology;  Laterality: N/A;   ESOPHAGOGASTRODUODENOSCOPY (EGD) WITH PROPOFOL N/A 09/21/2020   Procedure: ESOPHAGOGASTRODUODENOSCOPY (EGD) WITH PROPOFOL;  Surgeon: Rush Landmark Telford Nab., MD;  Location: [facility];  Service: Gastroenterology;  Laterality: N/A;   ESOPHAGOGASTRODUODENOSCOPY (EGD) WITH PROPOFOL N/A 10/11/2021   Procedure: ESOPHAGOGASTRODUODENOSCOPY (EGD) WITH PROPOFOL;  Surgeon: Rush Landmark Telford Nab., MD;  Location: [facility];  Service: Gastroenterology;  Laterality: N/A;   HEMOSTASIS CLIP PLACEMENT  09/21/2020   Procedure: HEMOSTASIS CLIP PLACEMENT;  Surgeon: Irving Copas., MD;  Location: [facility];  Service: Gastroenterology;;   HEMOSTASIS CLIP PLACEMENT  10/11/2021   Procedure: HEMOSTASIS CLIP PLACEMENT;  Surgeon: Irving Copas., MD;  Location: [facility];  Service: Gastroenterology;;   NO PAST SURGERIES     POLYPECTOMY  10/11/2021   Procedure: POLYPECTOMY;  Surgeon: Irving Copas., MD;  Location: [facility];  Service:  Gastroenterology;;   SUBMUCOSAL LIFTING INJECTION  09/21/2020   Procedure: SUBMUCOSAL LIFTING INJECTION;  Surgeon: Irving Copas., MD;  Location: [facility];  Service: Gastroenterology;;   [place] INJECTION  10/11/2021   Procedure: SUBMUCOSAL LIFTING INJECTION;  Surgeon: Irving Copas., MD;  Location: [facility];  Service: Gastroenterology;;   SUBMUCOSAL TATTOO INJECTION  10/11/2021   Procedure: SUBMUCOSAL TATTOO INJECTION;  Surgeon: Irving Copas., MD;  Location: [facility];  Service: Gastroenterology;;    ROS: Review of Systems Negative except as stated above  PHYSICAL EXAM: BP 111/75 (BP Location: Left Arm, Patient Position: Sitting, Cuff Size: Normal)   Pulse 78   Temp 98.3 F (  36.8 C)   Resp 16   Ht 5' (1.524 m)   Wt 146 lb (66.2 kg)   LMP 09/27/2012   SpO2 98%   BMI 28.51 kg/m   Physical Exam HENT:     Head: Normocephalic and atraumatic.  Eyes:     Extraocular Movements: Extraocular movements intact.     Conjunctiva/sclera: Conjunctivae normal.     Pupils: Pupils are equal, round, and reactive to light.  Cardiovascular:     Rate and Rhythm: Normal rate and regular rhythm.     Pulses: Normal pulses.     Heart sounds: Normal heart sounds.  Pulmonary:     Effort: Pulmonary effort is normal.     Breath sounds: Normal breath sounds.  Musculoskeletal:     Cervical back: Normal range of motion and neck supple.  Neurological:     General: No focal deficit present.     Mental Status: She is alert and oriented to person, place, and time.  Psychiatric:        Mood and Affect: Mood normal.        Behavior: Behavior normal.     ASSESSMENT AND PLAN: 1. Primary hypertension - Continue Amlodipine, Losartan, and Metoprolol Tartrate as prescribed. No refills needed as of present.  - Counseled on blood pressure goal of less than 130/80, low-sodium, DASH diet, medication compliance, and 150 minutes of moderate intensity exercise per week  as tolerated. Counseled on medication adherence and adverse effects. - Update BMP.  - Follow-up with primary provider in 3 months or sooner if needed. - Basic Metabolic Panel  2. Mixed hyperlipidemia - Update lipid panel. Will update medication regimen once lab results.  - Lipid panel  3. Prediabetes - Update hemoglobin A1c.  - Hemoglobin A1c  4. Cataract of right eye, unspecified cataract type 5. Routine eye exam - Referral to Ophthalmology for further evaluation/management.  - Ambulatory referral to Ophthalmology  Patient was given the opportunity to ask questions.  Patient verbalized understanding of the plan and was able to repeat key elements of the plan. Patient was given clear instructions to go to Emergency Department or return to medical center if symptoms don't improve, worsen, or new problems develop.The patient verbalized understanding.   Orders Placed This Encounter  Procedures   Basic Metabolic Panel   Lipid panel   Hemoglobin A1c   Ambulatory referral to Ophthalmology    Return in about 3 months (around 05/23/2022) for Follow-Up or next available chronic care mgmt .  Camillia Herter, NP

## 2022-02-21 ENCOUNTER — Encounter: Payer: Self-pay | Admitting: Family

## 2022-02-21 ENCOUNTER — Ambulatory Visit (INDEPENDENT_AMBULATORY_CARE_PROVIDER_SITE_OTHER): Payer: Commercial Managed Care - HMO | Admitting: Family

## 2022-02-21 VITALS — BP 111/75 | HR 78 | Temp 98.3°F | Resp 16 | Ht 60.0 in | Wt 146.0 lb

## 2022-02-21 DIAGNOSIS — H269 Unspecified cataract: Secondary | ICD-10-CM

## 2022-02-21 DIAGNOSIS — Z0100 Encounter for examination of eyes and vision without abnormal findings: Secondary | ICD-10-CM

## 2022-02-21 DIAGNOSIS — Z23 Encounter for immunization: Secondary | ICD-10-CM

## 2022-02-21 DIAGNOSIS — R7303 Prediabetes: Secondary | ICD-10-CM | POA: Diagnosis not present

## 2022-02-21 DIAGNOSIS — I10 Essential (primary) hypertension: Secondary | ICD-10-CM | POA: Diagnosis not present

## 2022-02-21 DIAGNOSIS — E782 Mixed hyperlipidemia: Secondary | ICD-10-CM

## 2022-02-21 NOTE — Progress Notes (Signed)
.  Pt presents for chronic care management

## 2022-02-22 ENCOUNTER — Other Ambulatory Visit: Payer: Self-pay | Admitting: Family

## 2022-02-22 DIAGNOSIS — E782 Mixed hyperlipidemia: Secondary | ICD-10-CM

## 2022-02-22 LAB — BASIC METABOLIC PANEL WITH GFR
BUN/Creatinine Ratio: 16 (ref 12–28)
BUN: 14 mg/dL (ref 8–27)
CO2: 22 mmol/L (ref 20–29)
Calcium: 10 mg/dL (ref 8.7–10.3)
Chloride: 108 mmol/L — ABNORMAL HIGH (ref 96–106)
Creatinine, Ser: 0.89 mg/dL (ref 0.57–1.00)
Glucose: 74 mg/dL (ref 70–99)
Potassium: 4.2 mmol/L (ref 3.5–5.2)
Sodium: 145 mmol/L — ABNORMAL HIGH (ref 134–144)
eGFR: 73 mL/min/{1.73_m2}

## 2022-02-22 LAB — LIPID PANEL
Chol/HDL Ratio: 2.7 ratio (ref 0.0–4.4)
Cholesterol, Total: 181 mg/dL (ref 100–199)
HDL: 67 mg/dL
LDL Chol Calc (NIH): 98 mg/dL (ref 0–99)
Triglycerides: 90 mg/dL (ref 0–149)
VLDL Cholesterol Cal: 16 mg/dL (ref 5–40)

## 2022-02-22 LAB — HEMOGLOBIN A1C
Est. average glucose Bld gHb Est-mCnc: 126 mg/dL
Hgb A1c MFr Bld: 6 % — ABNORMAL HIGH (ref 4.8–5.6)

## 2022-02-22 MED ORDER — SIMVASTATIN 40 MG PO TABS: 40.0000 mg | ORAL_TABLET | Freq: Every day | ORAL | 2 refills | Status: DC

## 2022-03-31 ENCOUNTER — Ambulatory Visit: Payer: Commercial Managed Care - HMO

## 2022-04-04 ENCOUNTER — Ambulatory Visit
Admission: RE | Admit: 2022-04-04 | Discharge: 2022-04-04 | Disposition: A | Discharge status: Home / Self Care | Payer: Commercial Managed Care - HMO | Source: Ambulatory Visit | Attending: Family | Admitting: Family

## 2022-04-04 DIAGNOSIS — Z1231 Encounter for screening mammogram for malignant neoplasm of breast: Secondary | ICD-10-CM

## 2022-04-07 NOTE — Progress Notes (Signed)
Result note read to pt, verbalizes understanding.

## 2022-04-11 NOTE — Progress Notes (Unsigned)
Patient ID: Katherine Henson, female    DOB: 06/25/1960  MRN: [id number]  CC: Chronic Care Management   Subjective: Katherine Henson is a 62 y.o. female who presents for chronic care management.   Her concerns today include:  HTN - Amlodipine, Losartan, and Metoprolol Tartrate  HLD - Simvastatin   Patient Active Problem List   Diagnosis Date Noted   Abnormal findings on esophagogastroduodenoscopy (EGD) 07/22/2020   Dysphagia 07/22/2020   Adenomatous duodenal polyp 10/93/2355   Helicobacter pylori infection 07/22/2020   Esophagitis determined by endoscopy 07/22/2020   History of cerebellar stroke 11/26/2019   Mixed hyperlipidemia 11/26/2019   Positive double stranded DNA antibody test 04/10/2019   Chronic hepatitis C without hepatic coma ([place]) 12/06/2018   HTN (hypertension) 04/30/2018     Current Outpatient Medications on File Prior to Visit  Medication Sig Dispense Refill   amLODipine (NORVASC) 10 MG tablet Take 1 tablet by mouth once daily 90 tablet 0   aspirin 81 MG chewable tablet Chew 1 tablet (81 mg total) by mouth daily. 30 tablet 0   cyanocobalamin 2000 MCG tablet Take 2,000 mcg by mouth daily.     ferrous sulfate 325 (65 FE) MG tablet Take 325 mg by mouth daily with breakfast.     furosemide (LASIX) 20 MG tablet Take 1 tablet (20 mg total) by mouth daily for 4 days. 4 tablet 0   losartan (COZAAR) 50 MG tablet Take 1 tablet by mouth once daily 90 tablet 0   metoprolol tartrate (LOPRESSOR) 25 MG tablet Take 1/2 (one-half) tablet by mouth twice daily 120 tablet 0   pantoprazole (PROTONIX) 40 MG tablet Take 1 tablet (40 mg total) by mouth 2 (two) times daily. 180 tablet 3   simvastatin (ZOCOR) 40 MG tablet Take 1 tablet (40 mg total) by mouth daily at 6 PM. 30 tablet 2   sucralfate (CARAFATE) 1 g tablet CRUSH 1 TABLET AND DISSOLVE IN 10 ML OF WARM WATER, MIX WELL TO CREATE SLURRY AND DRINK 2 TIMES DAILY 60 tablet 0   No current facility-administered medications on file  prior to visit.    Allergies  Allergen Reactions   Penicillins Anaphylaxis    Did it involve swelling of the face/tongue/throat, SOB, or low BP? No Did it involve sudden or severe rash/hives, skin peeling, or any reaction on the inside of your mouth or nose? Yes Did you need to seek medical attention at a hospital or doctor's office? No When did it last happen?  30 years ago     If all above answers are "NO", may proceed with cephalosporin use.    Social History   Socioeconomic History   Marital status: Single    Spouse name: Not on file   Number of children: 5   Years of education: Not on file   Highest education level: Not on file  Occupational History   Occupation: housekeeper  Tobacco Use   Smoking status: Former    Packs/day: 0.50    Years: 30.00    Total pack years: 15.00    Types: Cigarettes    Quit date: 12/06/2010    Years since quitting: 11.3    Passive exposure: Past   Smokeless tobacco: Never  Vaping Use   Vaping Use: Never used  Substance and Sexual Activity   Alcohol use: Yes    Alcohol/week: 1.0 standard drink of alcohol    Types: 1 Glasses of wine per week    Comment: occasional   Drug  use: Not Currently    Types: Marijuana   Sexual activity: Not Currently  Other Topics Concern   Not on file  Social History Narrative   Left handed   One story home   No caffeine   Social Determinants of Health   Financial Resource Strain: Not on file  Food Insecurity: Food Insecurity Present (02/03/2021)   Hunger Vital Sign    Worried About Running Out of Food in the Last Year: Often true    Ran Out of Food in the Last Year: Sometimes true  Transportation Needs: Unmet Transportation Needs (02/03/2021)   PRAPARE - Hydrologist (Medical): Yes    Lack of Transportation (Non-Medical): Yes  Physical Activity: Not on file  Stress: Not on file  Social Connections: Not on file  Intimate Partner Violence: Not on file    Family History   Problem Relation Age of Onset   Stroke Mother    Anuerysm Mother        brain   Diabetes Sister    Breast cancer Sister    Breast cancer Sister    Diabetes Brother    Prostate cancer Brother    Breast cancer Niece    Breast cancer Niece    Colon cancer Neg Hx    Esophageal cancer Neg Hx    Liver disease Neg Hx    Stomach cancer Neg Hx    Pancreatic cancer Neg Hx    Inflammatory bowel disease Neg Hx    Rectal cancer Neg Hx     Past Surgical History:  Procedure Laterality Date   ENDOSCOPIC MUCOSAL RESECTION  09/21/2020   Procedure: ENDOSCOPIC MUCOSAL RESECTION;  Surgeon: Rush Landmark, Telford Nab., MD;  Location: [facility];  Service: Gastroenterology;;   ENDOSCOPIC MUCOSAL RESECTION N/A 10/11/2021   Procedure: ENDOSCOPIC MUCOSAL RESECTION;  Surgeon: Irving Copas., MD;  Location: [facility];  Service: Gastroenterology;  Laterality: N/A;   ESOPHAGOGASTRODUODENOSCOPY (EGD) WITH PROPOFOL N/A 09/21/2020   Procedure: ESOPHAGOGASTRODUODENOSCOPY (EGD) WITH PROPOFOL;  Surgeon: Rush Landmark Telford Nab., MD;  Location: [facility];  Service: Gastroenterology;  Laterality: N/A;   ESOPHAGOGASTRODUODENOSCOPY (EGD) WITH PROPOFOL N/A 10/11/2021   Procedure: ESOPHAGOGASTRODUODENOSCOPY (EGD) WITH PROPOFOL;  Surgeon: Rush Landmark Telford Nab., MD;  Location: [facility];  Service: Gastroenterology;  Laterality: N/A;   HEMOSTASIS CLIP PLACEMENT  09/21/2020   Procedure: HEMOSTASIS CLIP PLACEMENT;  Surgeon: Irving Copas., MD;  Location: [facility];  Service: Gastroenterology;;   HEMOSTASIS CLIP PLACEMENT  10/11/2021   Procedure: HEMOSTASIS CLIP PLACEMENT;  Surgeon: Irving Copas., MD;  Location: [facility];  Service: Gastroenterology;;   NO PAST SURGERIES     POLYPECTOMY  10/11/2021   Procedure: POLYPECTOMY;  Surgeon: Irving Copas., MD;  Location: [facility];  Service: Gastroenterology;;   SUBMUCOSAL LIFTING INJECTION  09/21/2020   Procedure: SUBMUCOSAL LIFTING  INJECTION;  Surgeon: Irving Copas., MD;  Location: [facility];  Service: Gastroenterology;;   [place] INJECTION  10/11/2021   Procedure: SUBMUCOSAL LIFTING INJECTION;  Surgeon: Irving Copas., MD;  Location: [facility];  Service: Gastroenterology;;   SUBMUCOSAL TATTOO INJECTION  10/11/2021   Procedure: SUBMUCOSAL TATTOO INJECTION;  Surgeon: Irving Copas., MD;  Location: [facility];  Service: Gastroenterology;;    ROS: Review of Systems Negative except as stated above  PHYSICAL EXAM: LMP 09/27/2012   Physical Exam  {female adult master:310786} {female adult master:310785}     Latest Ref Rng & Units 02/21/2022    3:54 PM 07/09/2021    2:03  PM 12/14/2020   11:05 AM  CMP  Glucose 70 - 99 mg/dL 74  103  75   BUN 8 - 27 mg/dL '14  13  16   '$ Creatinine 0.57 - 1.00 mg/dL 0.89  0.91  0.89   Sodium 134 - 144 mmol/L 145  145  143   Potassium 3.5 - 5.2 mmol/L 4.2  4.2  4.4   Chloride 96 - 106 mmol/L 108  107  106   CO2 20 - 29 mmol/L '22  23  22   '$ Calcium 8.7 - 10.3 mg/dL 10.0  9.6  9.6    Lipid Panel     Component Value Date/Time   CHOL 181 02/21/2022 1554   TRIG 90 02/21/2022 1554   HDL 67 02/21/2022 1554   CHOLHDL 2.7 02/21/2022 1554   CHOLHDL 4.6 05/01/2018 0338   VLDL 52 (H) 05/01/2018 0338   LDLCALC 98 02/21/2022 1554    CBC    Component Value Date/Time   WBC 7.5 07/21/2020 1607   RBC 4.30 07/21/2020 1607   HGB 11.8 (L) 07/21/2020 1607   HGB 12.2 04/03/2019 1001   HCT 35.8 (L) 07/21/2020 1607   HCT 36.9 04/03/2019 1001   PLT 305.0 07/21/2020 1607   PLT 316 04/03/2019 1001   MCV 83.2 07/21/2020 1607   MCV 85 04/03/2019 1001   MCH 27.4 11/13/2019 1722   MCHC 32.9 07/21/2020 1607   RDW 15.8 (H) 07/21/2020 1607   RDW 14.8 04/03/2019 1001   LYMPHSABS 2.3 01/28/2019 1452   MONOABS 0.6 01/28/2019 1452   EOSABS 0.1 01/28/2019 1452   BASOSABS 0.0 01/28/2019 1452    ASSESSMENT AND PLAN:  There are no diagnoses linked to this  encounter.   Patient was given the opportunity to ask questions.  Patient verbalized understanding of the plan and was able to repeat key elements of the plan. Patient was given clear instructions to go to Emergency Department or return to medical center if symptoms don't improve, worsen, or new problems develop.The patient verbalized understanding.   No orders of the defined types were placed in this encounter.    Requested Prescriptions    No prescriptions requested or ordered in this encounter    No follow-ups on file.  Camillia Herter, NP

## 2022-04-12 ENCOUNTER — Ambulatory Visit: Payer: Medicaid Other | Admitting: Family

## 2022-04-12 VITALS — BP 109/64 | HR 62 | Temp 98.3°F | Resp 16 | Ht 60.0 in | Wt 146.0 lb

## 2022-04-12 DIAGNOSIS — E782 Mixed hyperlipidemia: Secondary | ICD-10-CM

## 2022-04-12 DIAGNOSIS — I10 Essential (primary) hypertension: Secondary | ICD-10-CM | POA: Diagnosis not present

## 2022-04-12 DIAGNOSIS — M542 Cervicalgia: Secondary | ICD-10-CM

## 2022-04-12 MED ORDER — LOSARTAN POTASSIUM 50 MG PO TABS: 50.0000 mg | ORAL_TABLET | Freq: Every day | ORAL | 2 refills | Status: DC

## 2022-04-12 MED ORDER — TRIAMCINOLONE ACETONIDE 40 MG/ML IJ SUSP
40.0000 mg | Freq: Once | INTRAMUSCULAR | Status: AC
  Administered 2022-04-12: 40 mg via INTRAMUSCULAR

## 2022-04-12 MED ORDER — KETOROLAC TROMETHAMINE 30 MG/ML IJ SOLN
30.0000 mg | Freq: Once | INTRAMUSCULAR | Status: AC
  Administered 2022-04-12: 30 mg via INTRAMUSCULAR

## 2022-04-12 MED ORDER — AMLODIPINE BESYLATE 10 MG PO TABS: 10.0000 mg | ORAL_TABLET | Freq: Every day | ORAL | 2 refills | Status: DC

## 2022-04-12 MED ORDER — METOPROLOL TARTRATE 25 MG PO TABS: ORAL_TABLET | ORAL | 2 refills | Status: DC

## 2022-04-12 MED ORDER — SIMVASTATIN 40 MG PO TABS: 40.0000 mg | ORAL_TABLET | Freq: Every day | ORAL | 2 refills | Status: DC

## 2022-04-12 NOTE — Progress Notes (Signed)
.  Pt presents for chronic care management   -pt states for about a week she has been experiencing neck pain that is radiating to arm -going on for a week

## 2022-05-10 ENCOUNTER — Encounter (HOSPITAL_COMMUNITY): Payer: Self-pay | Admitting: *Deleted

## 2022-05-10 ENCOUNTER — Ambulatory Visit (INDEPENDENT_AMBULATORY_CARE_PROVIDER_SITE_OTHER): Payer: Medicaid Other

## 2022-05-10 ENCOUNTER — Ambulatory Visit (HOSPITAL_COMMUNITY)
Admission: EM | Admit: 2022-05-10 | Discharge: 2022-05-10 | Disposition: A | Discharge status: Home / Self Care | Payer: Medicaid Other | Attending: Emergency Medicine | Admitting: Emergency Medicine

## 2022-05-10 DIAGNOSIS — J329 Chronic sinusitis, unspecified: Secondary | ICD-10-CM | POA: Diagnosis not present

## 2022-05-10 DIAGNOSIS — R0689 Other abnormalities of breathing: Secondary | ICD-10-CM | POA: Diagnosis not present

## 2022-05-10 DIAGNOSIS — R059 Cough, unspecified: Secondary | ICD-10-CM

## 2022-05-10 DIAGNOSIS — J069 Acute upper respiratory infection, unspecified: Secondary | ICD-10-CM | POA: Diagnosis not present

## 2022-05-10 MED ORDER — GUAIFENESIN 100 MG/5ML PO LIQD: 10.0000 mL | Freq: Four times a day (QID) | ORAL | 0 refills | Status: DC | PRN

## 2022-05-10 MED ORDER — AZELASTINE-FLUTICASONE 137-50 MCG/ACT NA SUSP: 1.0000 | Freq: Two times a day (BID) | NASAL | 2 refills | Status: DC

## 2022-05-10 MED ORDER — CETIRIZINE HCL 10 MG PO TABS: 10.0000 mg | ORAL_TABLET | Freq: Every day | ORAL | 1 refills | Status: AC

## 2022-05-10 MED ORDER — AZELASTINE-FLUTICASONE 137-50 MCG/ACT NA SUSP: 1.0000 | Freq: Two times a day (BID) | NASAL | 2 refills | Status: AC

## 2022-05-10 NOTE — ED Triage Notes (Signed)
Pt states she has had cough and congestion since Sunday. She has high BP so she has been taking OTC cold meds for people with high blood pressure. She said she doesn't know what to take. When she coughs a lot she gets dizzy.

## 2022-05-10 NOTE — ED Provider Notes (Signed)
[place]    CSN: [id number] Arrival date & time: 05/10/22  0945    HISTORY   Chief Complaint  Patient presents with   Cough   Nasal Congestion   HPI Katherine Henson is a pleasant, 62 y.o. female who presents to urgent care today. Pt states she has had cough and congestion x 2 days. States that bc she has high BP, she has been taking OTC cold meds for people with high blood pressure. She said she doesn't know what else to take. States that when she coughs a lot, she gets dizzy.  Patient's blood pressure is excellent on arrival today.  Patient does have an O2 sat of 94%.  Patient denies a history of allergies and asthma.  The history is provided by the patient.   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   Helicobacter pylori infection 07/22/2020   Esophagitis determined by endoscopy 07/22/2020   History of cerebellar stroke 11/26/2019   Mixed hyperlipidemia 11/26/2019   Positive double stranded DNA antibody test 04/10/2019   Chronic hepatitis C without hepatic coma ([place]) 12/06/2018   HTN (hypertension) 04/30/2018   Past Surgical History:  Procedure Laterality Date   ENDOSCOPIC MUCOSAL RESECTION  09/21/2020   Procedure: ENDOSCOPIC MUCOSAL RESECTION;  Surgeon: Irving Copas., MD;  Location: [facility];  Service: Gastroenterology;;   ENDOSCOPIC MUCOSAL RESECTION N/A 10/11/2021   Procedure: ENDOSCOPIC MUCOSAL RESECTION;  Surgeon: Irving Copas., MD;  Location: [facility];  Service: Gastroenterology;  Laterality: N/A;   ESOPHAGOGASTRODUODENOSCOPY (EGD) WITH PROPOFOL N/A 09/21/2020   Procedure: ESOPHAGOGASTRODUODENOSCOPY (EGD) WITH PROPOFOL;   Surgeon: Rush Landmark Telford Nab., MD;  Location: [facility] ENDOSCOPY;  Service: Gastroenterology;  Laterality: N/A;   ESOPHAGOGASTRODUODENOSCOPY (EGD) WITH PROPOFOL N/A 10/11/2021   Procedure: ESOPHAGOGASTRODUODENOSCOPY (EGD) WITH PROPOFOL;  Surgeon: Rush Landmark Telford Nab., MD;  Location: [facility] ENDOSCOPY;  Service: Gastroenterology;  Laterality: N/A;   HEMOSTASIS CLIP PLACEMENT  09/21/2020   Procedure: HEMOSTASIS CLIP PLACEMENT;  Surgeon: Irving Copas., MD;  Location: [facility];  Service: Gastroenterology;;   HEMOSTASIS CLIP PLACEMENT  10/11/2021   Procedure: HEMOSTASIS CLIP PLACEMENT;  Surgeon: Irving Copas., MD;  Location: [facility];  Service: Gastroenterology;;   NO PAST SURGERIES     POLYPECTOMY  10/11/2021   Procedure: POLYPECTOMY;  Surgeon: Irving Copas., MD;  Location: [facility];  Service: Gastroenterology;;   SUBMUCOSAL LIFTING INJECTION  09/21/2020   Procedure: SUBMUCOSAL LIFTING INJECTION;  Surgeon: Irving Copas., MD;  Location: [facility];  Service: Gastroenterology;;   [place] INJECTION  10/11/2021   Procedure: SUBMUCOSAL LIFTING INJECTION;  Surgeon: Irving Copas., MD;  Location: [facility];  Service: Gastroenterology;;   SUBMUCOSAL TATTOO INJECTION  10/11/2021   Procedure: SUBMUCOSAL TATTOO INJECTION;  Surgeon: Irving Copas., MD;  Location: [facility];  Service: Gastroenterology;;   OB History     Gravida  5   Para  5   Term  5   Preterm  0   AB  0   Living  5      SAB  0   IAB  0   Ectopic  0   Multiple  0   Live  Births  5          Home Medications    Prior to Admission medications   Medication Sig Start Date End Date Taking? Authorizing Provider  amLODipine (NORVASC) 10 MG tablet Take 1 tablet (10 mg total) by mouth daily. 04/12/22 07/11/22 Yes Camillia Herter, NP  aspirin 81 MG chewable tablet Chew 1 tablet (81 mg total) by mouth daily. 10/05/20  Yes Mansouraty, Telford Nab., MD   cyanocobalamin 2000 MCG tablet Take 2,000 mcg by mouth daily.   Yes [provider]  losartan (COZAAR) 50 MG tablet Take 1 tablet (50 mg total) by mouth daily. 04/12/22 07/11/22 Yes Minette Brine, Amy J, NP  metoprolol tartrate (LOPRESSOR) 25 MG tablet Take 1/2 (one-half) tablet by mouth twice daily 04/12/22  Yes Minette Brine, Amy J, NP  pantoprazole (PROTONIX) 40 MG tablet Take 1 tablet (40 mg total) by mouth 2 (two) times daily. 10/11/21  Yes Mansouraty, Telford Nab., MD  simvastatin (ZOCOR) 40 MG tablet Take 1 tablet (40 mg total) by mouth daily at 6 PM. 04/12/22 07/11/22 Yes Minette Brine, Amy J, NP  ferrous sulfate 325 (65 FE) MG tablet Take 325 mg by mouth daily with breakfast.    [provider]  furosemide (LASIX) 20 MG tablet Take 1 tablet (20 mg total) by mouth daily for 4 days. 11/09/21 11/13/21  Camillia Herter, NP  sucralfate (CARAFATE) 1 g tablet CRUSH 1 TABLET AND DISSOLVE IN 10 ML OF WARM WATER, MIX WELL TO CREATE SLURRY AND DRINK 2 TIMES DAILY 02/09/22   Mansouraty, Telford Nab., MD    Family History Family History  Problem Relation Age of Onset   Stroke Mother    Anuerysm Mother        brain   Diabetes Sister    Breast cancer Sister    Breast cancer Sister    Diabetes Brother    Prostate cancer Brother    Breast cancer Niece    Breast cancer Niece    Colon cancer Neg Hx    Esophageal cancer Neg Hx    Liver disease Neg Hx    Stomach cancer Neg Hx    Pancreatic cancer Neg Hx    Inflammatory bowel disease Neg Hx    Rectal cancer Neg Hx    Social History Social History   Tobacco Use   Smoking status: Former    Packs/day: 0.50    Years: 30.00    Total pack years: 15.00    Types: Cigarettes    Quit date: 12/06/2010    Years since quitting: 11.4    Passive exposure: Past   Smokeless tobacco: Never  Vaping Use   Vaping Use: Never used  Substance Use Topics   Alcohol use: Yes    Alcohol/week: 1.0 standard drink of alcohol    Types: 1 Glasses of wine per week    Comment:  occasional   Drug use: Not Currently    Types: Marijuana   Allergies   Penicillins  Review of Systems Review of Systems Pertinent findings revealed after performing a 14 point review of systems has been noted in the history of present illness.  Physical Exam Vital Signs BP 106/69 (BP Location: Left Arm)   Pulse 66   Temp 98.8 F (37.1 C) (Oral)   Resp 18   LMP 09/27/2012   SpO2 94%   No data found.  Physical Exam Vitals and nursing note reviewed.  Constitutional:      General: She is not in acute distress.  Appearance: Normal appearance. She is not ill-appearing.  HENT:     Head: Normocephalic and atraumatic.     Salivary Glands: Right salivary gland is not diffusely enlarged or tender. Left salivary gland is not diffusely enlarged or tender.     Right Ear: Ear canal and external ear normal. No drainage. A middle ear effusion is present. There is no impacted cerumen. Tympanic membrane is bulging. Tympanic membrane is not injected or erythematous.     Left Ear: Ear canal and external ear normal. No drainage. A middle ear effusion is present. There is no impacted cerumen. Tympanic membrane is bulging. Tympanic membrane is not injected or erythematous.     Ears:     Comments: Bilateral EACs normal, both TMs bulging with clear fluid    Nose: Rhinorrhea present. No nasal deformity, septal deviation, signs of injury, nasal tenderness, mucosal edema or congestion. Rhinorrhea is clear.     Right Nostril: Occlusion present. No foreign body, epistaxis or septal hematoma.     Left Nostril: Occlusion present. No foreign body, epistaxis or septal hematoma.     Right Turbinates: Enlarged, swollen and pale.     Left Turbinates: Enlarged, swollen and pale.     Right Sinus: No maxillary sinus tenderness or frontal sinus tenderness.     Left Sinus: No maxillary sinus tenderness or frontal sinus tenderness.     Mouth/Throat:     Lips: Pink. No lesions.     Mouth: Mucous membranes are  moist. No oral lesions.     Pharynx: Oropharynx is clear. Uvula midline. No posterior oropharyngeal erythema or uvula swelling.     Tonsils: No tonsillar exudate. 0 on the right. 0 on the left.     Comments: Postnasal drip Eyes:     General: Lids are normal.        Right eye: No discharge.        Left eye: No discharge.     Extraocular Movements: Extraocular movements intact.     Conjunctiva/sclera: Conjunctivae normal.     Right eye: Right conjunctiva is not injected.     Left eye: Left conjunctiva is not injected.  Neck:     Trachea: Trachea and phonation normal.  Cardiovascular:     Rate and Rhythm: Normal rate and regular rhythm.     Pulses: Normal pulses.     Heart sounds: Normal heart sounds. No murmur heard.    No friction rub. No gallop.  Pulmonary:     Effort: Pulmonary effort is normal. No accessory muscle usage, prolonged expiration or respiratory distress.     Breath sounds: No stridor, decreased air movement or transmitted upper airway sounds. Examination of the right-middle field reveals rales. Examination of the right-lower field reveals decreased breath sounds and rales. Decreased breath sounds and rales present. No wheezing or rhonchi.  Chest:     Chest wall: No tenderness.  Musculoskeletal:        General: Normal range of motion.     Cervical back: Normal range of motion and neck supple. Normal range of motion.  Lymphadenopathy:     Cervical: No cervical adenopathy.  Skin:    General: Skin is warm and dry.     Findings: No erythema or rash.  Neurological:     General: No focal deficit present.     Mental Status: She is alert and oriented to person, place, and time.  Psychiatric:        Mood and Affect: Mood normal.  Behavior: Behavior normal.     Visual Acuity Right Eye Distance:   Left Eye Distance:   Bilateral Distance:    Right Eye Near:   Left Eye Near:    Bilateral Near:     UC Couse / Diagnostics / Procedures:     Radiology DG Chest  2 View  Result Date: 05/10/2022 CLINICAL DATA:  decreased BS, rales RLL, productive cough EXAM: CHEST - 2 VIEW COMPARISON:  10/21/2019 FINDINGS: Cardiac silhouette is unremarkable. No pneumothorax or pleural effusion. The lungs are clear. Aorta is calcified. The visualized skeletal structures are unremarkable. IMPRESSION: No acute cardiopulmonary process. Electronically Signed   By: Sammie Bench M.D.   On: 05/10/2022 13:11    Procedures Procedures (including critical care time) EKG  Pending results:  Labs Reviewed - No data to display  Medications Ordered in UC: Medications - No data to display  UC Diagnoses / Final Clinical Impressions(s)   I have reviewed the triage vital signs and the nursing notes.  Pertinent labs & imaging results that were available during my care of the patient were reviewed by me and considered in my medical decision making (see chart for details).    Final diagnoses:  Viral URI with cough  Rhinosinusitis   Patient advised of x-ray findings.  Recommend allergy medications as prescribed and Robitussin for relief of symptoms.  Patient provided with a 200 mg dose of Robitussin given history of high blood pressure.  Conservative care recommended, return precautions advised.  Please see discharge instructions below for further details of plan of care as provided to patient. ED Prescriptions     Medication Sig Dispense Auth. Provider   cetirizine (ZYRTEC ALLERGY) 10 MG tablet Take 1 tablet (10 mg total) by mouth at bedtime. 90 tablet Lynden Oxford Scales, PA-C   Azelastine-Fluticasone [facility]) 137-50 MCG/ACT SUSP Place 1 spray into the nose every 12 (twelve) hours. 23 g Lynden Oxford Scales, PA-C   guaiFENesin (ROBITUSSIN) 100 MG/5ML liquid Take 10 mLs by mouth every 6 (six) hours as needed for cough or to loosen phlegm. 236 mL Lynden Oxford Scales, PA-C      PDMP not reviewed this encounter.  Disposition Upon Discharge:  Condition: stable for  discharge home Home: take medications as prescribed; routine discharge instructions as discussed; follow up as advised.  Patient presented with an acute illness with associated systemic symptoms and significant discomfort requiring urgent management. In my opinion, this is a condition that a prudent lay person (someone who possesses an average knowledge of health and medicine) may potentially expect to result in complications if not addressed urgently such as respiratory distress, impairment of bodily function or dysfunction of bodily organs.   Routine symptom specific, illness specific and/or disease specific instructions were discussed with the patient and/or caregiver at length.   As such, the patient has been evaluated and assessed, work-up was performed and treatment was provided in alignment with urgent care protocols and evidence based medicine.  Patient/parent/caregiver has been advised that the patient may require follow up for further testing and treatment if the symptoms continue in spite of treatment, as clinically indicated and appropriate.  If the patient was tested for COVID-19, Influenza and/or RSV, then the patient/parent/guardian was advised to isolate at home pending the results of his/her diagnostic coronavirus test and potentially longer if they're positive. I have also advised pt that if his/her COVID-19 test returns positive, it's recommended to self-isolate for at least 10 days after symptoms first appeared AND until fever-free for  24 hours without fever reducer AND other symptoms have improved or resolved. Discussed self-isolation recommendations as well as instructions for household member/close contacts as per the [facility] and  [facility], and also gave patient the [facility] packet with this information.  Patient/parent/caregiver has been advised to return to the [facility] or PCP in 3-5 days if no better; to PCP or the Emergency Department if new signs and symptoms develop, or if the current signs  or symptoms continue to change or worsen for further workup, evaluation and treatment as clinically indicated and appropriate  The patient will follow up with their current PCP if and as advised. If the patient does not currently have a PCP we will assist them in obtaining one.   The patient may need specialty follow up if the symptoms continue, in spite of conservative treatment and management, for further workup, evaluation, consultation and treatment as clinically indicated and appropriate.  Patient/parent/caregiver verbalized understanding and agreement of plan as discussed.  All questions were addressed during visit.  Please see discharge instructions below for further details of plan.  Discharge Instructions:   Discharge Instructions      The x-ray of your chest did not reveal any concerns for pneumonia or infection otherwise.  Antibiotics are not needed at this time.  Please read below to learn more about the medications, dosages and frequencies that I recommend to help alleviate your symptoms and to get you feeling better soon:   Zyrtec (cetirizine): This is an excellent second-generation antihistamine that helps to reduce respiratory inflammatory response to environmental allergens.  In some patients, this medication can cause daytime sleepiness so I recommend that you take 1 tablet daily at bedtime.     Dymista (fluticasone and azelastine): This is a combination nasal spray that contains both a nasal steroid and nasal antihistamine.  Please use 1 spray in each nare twice daily or every 12 hours.  This medication works best when it is used regularly, not "as needed".  You may find that it takes a few days for this to reach full effectiveness, please be patient with his onboarding process.  The most common side effect of this medication is nosebleeds.  Please discontinue use for 1 week if this occurs, then resume.    Robitussin, Mucinex (guaifenesin): This is an expectorant.  This helps  break up chest congestion and loosen up thick nasal drainage making phlegm and drainage more liquid and therefore easier to remove.  I recommend taking 200 mg three times daily as needed.      Please follow-up within the next 5-7 days either with your primary care provider or urgent care if your symptoms do not resolve.  If you do not have a primary care provider, we will assist you in finding one.        Thank you for visiting urgent care today.  We appreciate the opportunity to participate in your care.       This office note has been dictated using Museum/gallery curator.  Unfortunately, this method of dictation can sometimes lead to typographical or grammatical errors.  I apologize for your inconvenience in advance if this occurs.  Please do not hesitate to reach out to me if clarification is needed.      Lynden Oxford Scales, PA-C 05/10/22 1325

## 2022-05-10 NOTE — Discharge Instructions (Addendum)
The x-ray of your chest did not reveal any concerns for pneumonia or infection otherwise.  Antibiotics are not needed at this time.  Please read below to learn more about the medications, dosages and frequencies that I recommend to help alleviate your symptoms and to get you feeling better soon:   Zyrtec (cetirizine): This is an excellent second-generation antihistamine that helps to reduce respiratory inflammatory response to environmental allergens.  In some patients, this medication can cause daytime sleepiness so I recommend that you take 1 tablet daily at bedtime.     Dymista (fluticasone and azelastine): This is a combination nasal spray that contains both a nasal steroid and nasal antihistamine.  Please use 1 spray in each nare twice daily or every 12 hours.  This medication works best when it is used regularly, not "as needed".  You may find that it takes a few days for this to reach full effectiveness, please be patient with his onboarding process.  The most common side effect of this medication is nosebleeds.  Please discontinue use for 1 week if this occurs, then resume.    Robitussin, Mucinex (guaifenesin): This is an expectorant.  This helps break up chest congestion and loosen up thick nasal drainage making phlegm and drainage more liquid and therefore easier to remove.  I recommend taking 200 mg three times daily as needed.      Please follow-up within the next 5-7 days either with your primary care provider or urgent care if your symptoms do not resolve.  If you do not have a primary care provider, we will assist you in finding one.        Thank you for visiting urgent care today.  We appreciate the opportunity to participate in your care.

## 2022-05-23 ENCOUNTER — Ambulatory Visit: Payer: Commercial Managed Care - HMO | Admitting: Family

## 2022-06-17 DIAGNOSIS — H25812 Combined forms of age-related cataract, left eye: Secondary | ICD-10-CM | POA: Diagnosis not present

## 2022-07-07 NOTE — Progress Notes (Signed)
Patient ID: Katherine Henson, female    DOB: Mar 17, 1960  MRN: [id number]  CC: Chronic Care Management   Subjective: Katherine Henson is a 62 y.o. female who presents for chronic care management.   Her concerns today include:  - Doing well on blood pressure medications, no issues/concerns. She denies red flag symptoms such as but not limited to chest pain, shortness of breath, worst headache of life, nausea/vomiting.  - Doing well on cholesterol medication, no issues/concerns.  - Intermittent headache of right side. Radiates to right side of neck. She denies associated red flag symptoms. Taking over-the-counter analgesics with mild relief.  - No further issues/concerns for discussion today.   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 30 tablet 0   Azelastine-Fluticasone (DYMISTA) 137-50 MCG/ACT SUSP Place 1 spray into the nose every 12 (twelve) hours. 23 g 2   cetirizine (ZYRTEC ALLERGY) 10 MG tablet Take 1 tablet (10 mg total) by mouth at bedtime. 90 tablet 1   guaiFENesin (ROBITUSSIN) 100 MG/5ML liquid Take 10 mLs by mouth every 6 (six) hours as needed for cough or to loosen phlegm. 236 mL 0   pantoprazole (PROTONIX) 40 MG tablet Take 1 tablet (40 mg total) by mouth 2 (two) times daily. 180 tablet 3   No current facility-administered medications on file prior to visit.     Allergies  Allergen Reactions   Penicillins Anaphylaxis    Did it involve swelling of the face/tongue/throat, SOB, or low BP? No Did it involve sudden or severe rash/hives, skin peeling, or any reaction on the inside of your mouth or nose? Yes Did you need to seek medical attention at a hospital or doctor's office? No When did it last happen?  30 years ago     If all above answers are "NO", may proceed with cephalosporin use.    Social History   Socioeconomic History   Marital status: Single    Spouse name: Not on file   Number of children: 5   Years of education: Not on file   Highest education level: Not on file  Occupational History   Occupation: housekeeper  Tobacco Use   Smoking status: Former    Packs/day: 0.50    Years: 30.00    Additional pack years: 0.00    Total pack years: 15.00    Types: Cigarettes    Quit date: 12/06/2010    Years since quitting: 11.6    Passive exposure: Past   Smokeless tobacco: Never  Vaping Use   Vaping Use: Never used  Substance and Sexual Activity   Alcohol use: Yes    Alcohol/week: 1.0 standard drink of alcohol    Types: 1 Glasses of wine per week    Comment: occasional   Drug use: Not Currently    Types: Marijuana   Sexual activity: Not Currently  Other Topics Concern   Not on file  Social  History Narrative   Left handed   One story home   No caffeine   Social Determinants of Health   Financial Resource Strain: Not on file  Food Insecurity: Food Insecurity Present (02/03/2021)   Hunger Vital Sign    Worried About Running Out of Food in the Last Year: Often true    Ran Out of Food in the Last Year: Sometimes true  Transportation Needs: Unmet Transportation Needs (02/03/2021)   PRAPARE - Administrator, Civil Service (Medical): Yes    Lack of Transportation (Non-Medical): Yes  Physical Activity: Not on file  Stress: Not on file  Social Connections: Not on file  Intimate Partner Violence: Not on file     Family History  Problem Relation Age of Onset   Stroke Mother    Anuerysm Mother        brain   Diabetes Sister    Breast cancer Sister    Breast cancer Sister    Diabetes Brother    Prostate cancer Brother    Breast cancer Niece    Breast cancer Niece    Colon cancer Neg Hx    Esophageal cancer Neg Hx    Liver disease Neg Hx    Stomach cancer Neg Hx    Pancreatic cancer Neg Hx    Inflammatory bowel disease Neg Hx    Rectal cancer Neg Hx     Past Surgical History:  Procedure Laterality Date   ENDOSCOPIC MUCOSAL RESECTION  09/21/2020   Procedure: ENDOSCOPIC MUCOSAL RESECTION;  Surgeon: Meridee Score, Netty Starring., MD;  Location: [facility];  Service: Gastroenterology;;   ENDOSCOPIC MUCOSAL RESECTION N/A 10/11/2021   Procedure: ENDOSCOPIC MUCOSAL RESECTION;  Surgeon: Lemar Lofty., MD;  Location: [facility];  Service: Gastroenterology;  Laterality: N/A;   ESOPHAGOGASTRODUODENOSCOPY (EGD) WITH PROPOFOL N/A 09/21/2020   Procedure: ESOPHAGOGASTRODUODENOSCOPY (EGD) WITH PROPOFOL;  Surgeon: Meridee Score Netty Starring., MD;  Location: [facility];  Service: Gastroenterology;  Laterality: N/A;   ESOPHAGOGASTRODUODENOSCOPY (EGD) WITH PROPOFOL N/A 10/11/2021   Procedure: ESOPHAGOGASTRODUODENOSCOPY (EGD) WITH PROPOFOL;  Surgeon: Meridee Score Netty Starring., MD;  Location: [facility];  Service: Gastroenterology;  Laterality: N/A;   HEMOSTASIS CLIP PLACEMENT  09/21/2020   Procedure: HEMOSTASIS CLIP PLACEMENT;  Surgeon: Lemar Lofty., MD;  Location: [facility];  Service: Gastroenterology;;   HEMOSTASIS CLIP PLACEMENT  10/11/2021   Procedure: HEMOSTASIS CLIP PLACEMENT;  Surgeon: Lemar Lofty., MD;  Location: [facility];  Service: Gastroenterology;;   NO PAST SURGERIES     POLYPECTOMY  10/11/2021   Procedure: POLYPECTOMY;  Surgeon: Lemar Lofty., MD;  Location: [facility];  Service: Gastroenterology;;   SUBMUCOSAL LIFTING INJECTION  09/21/2020   Procedure:  SUBMUCOSAL LIFTING INJECTION;  Surgeon: Lemar Lofty., MD;  Location: [facility];  Service: Gastroenterology;;   SUBMUCOSAL LIFTING INJECTION  10/11/2021   Procedure: SUBMUCOSAL LIFTING INJECTION;  Surgeon: Lemar Lofty., MD;  Location: [facility];  Service: Gastroenterology;;   SUBMUCOSAL TATTOO INJECTION  10/11/2021   Procedure: SUBMUCOSAL TATTOO INJECTION;  Surgeon: Lemar Lofty., MD;  Location: [facility];  Service: Gastroenterology;;    ROS: Review of Systems Negative except as stated above  PHYSICAL EXAM: BP 106/66 (BP Location: Right Arm, Patient Position: Sitting, Cuff Size: Normal)   Pulse 66   Temp 98.7 F (37.1 C)   Resp 14   Ht 5' (1.524 m)   Wt 144 lb 3.2 oz (65.4 kg)   LMP 09/27/2012   SpO2 98%   BMI 28.16 kg/m   Physical  Exam HENT:     Head: Normocephalic and atraumatic.     Nose: Nose normal.     Mouth/Throat:     Mouth: Mucous membranes are moist.     Pharynx: Oropharynx is clear.  Eyes:     Extraocular Movements: Extraocular movements intact.     Conjunctiva/sclera: Conjunctivae normal.     Pupils: Pupils are equal, round, and reactive to light.  Cardiovascular:     Rate and Rhythm: Normal rate and regular rhythm.     Pulses: Normal pulses.     Heart sounds: Normal heart sounds.  Pulmonary:     Effort: Pulmonary effort is normal.     Breath sounds: Normal breath sounds.  Musculoskeletal:     Cervical back: Normal range of motion and neck supple.  Neurological:     General: No focal deficit present.     Mental Status: She is alert and oriented to person, place, and time.  Psychiatric:        Mood and Affect: Mood normal.        Behavior: Behavior normal.      ASSESSMENT AND PLAN: 1. Primary hypertension - Continue Amlodipine and Losartan as prescribed.  - Routine screening.  - Counseled on blood pressure goal of less than 130/80, low-sodium, DASH diet, medication compliance, and 150 minutes of moderate intensity  exercise per week as tolerated. Counseled on medication adherence and adverse effects. - Follow-up with primary provider in 6 months or sooner if needed.  - Basic Metabolic Panel - amLODipine (NORVASC) 10 MG tablet; Take 1 tablet (10 mg total) by mouth daily.  Dispense: 90 tablet; Refill: 0 - losartan (COZAAR) 50 MG tablet; Take 1 tablet (50 mg total) by mouth daily.  Dispense: 90 tablet; Refill: 0 - metoprolol tartrate (LOPRESSOR) 25 MG tablet; Take 1/2 (one-half) tablet by mouth twice daily  Dispense: 90 tablet; Refill: 0  2. Mixed hyperlipidemia - Continue Simvastatin as prescribed. - Follow-up with primary provider as scheduled.  - simvastatin (ZOCOR) 40 MG tablet; Take 1 tablet (40 mg total) by mouth daily at 6 PM.  Dispense: 90 tablet; Refill: 0  3. Acute nonintractable headache, unspecified headache type - Sumatriptan as prescribed. Counseled on medication adherence/adverse effects.  - Follow-up with primary provider in 4 weeks or sooner if needed.  - SUMAtriptan (IMITREX) 50 MG tablet; Take 50 mg (1 tablet total) by mouth at the start of the headache. May repeat in 2 hours x 1 if headache persists. Max of 2 tablets/24 hours.  Dispense: 30 tablet; Refill: 1   Patient was given the opportunity to ask questions.  Patient verbalized understanding of the plan and was able to repeat key elements of the plan. Patient was given clear instructions to go to Emergency Department or return to medical center if symptoms don't improve, worsen, or new problems develop.The patient verbalized understanding.   Orders Placed This Encounter  Procedures   Basic Metabolic Panel     Requested Prescriptions   Signed Prescriptions Disp Refills   amLODipine (NORVASC) 10 MG tablet 90 tablet 0    Sig: Take 1 tablet (10 mg total) by mouth daily.   losartan (COZAAR) 50 MG tablet 90 tablet 0    Sig: Take 1 tablet (50 mg total) by mouth daily.   metoprolol tartrate (LOPRESSOR) 25 MG tablet 90 tablet 0     Sig: Take 1/2 (one-half) tablet by mouth twice daily   simvastatin (ZOCOR) 40 MG tablet 90 tablet 0    Sig: Take  1 tablet (40 mg total) by mouth daily at 6 PM.   SUMAtriptan (IMITREX) 50 MG tablet 30 tablet 1    Sig: Take 50 mg (1 tablet total) by mouth at the start of the headache. May repeat in 2 hours x 1 if headache persists. Max of 2 tablets/24 hours.    Return in about 6 months (around 01/11/2023) for Follow-Up or next available chronic care mgmt and 4 weeks headache.  Rema Fendt, NP

## 2022-07-11 ENCOUNTER — Ambulatory Visit: Payer: Medicaid Other | Admitting: Family

## 2022-07-11 ENCOUNTER — Encounter: Payer: Self-pay | Admitting: Family

## 2022-07-11 ENCOUNTER — Ambulatory Visit (INDEPENDENT_AMBULATORY_CARE_PROVIDER_SITE_OTHER): Payer: Medicaid Other | Admitting: Family

## 2022-07-11 VITALS — BP 106/66 | HR 66 | Temp 98.7°F | Resp 14 | Ht 60.0 in | Wt 144.2 lb

## 2022-07-11 DIAGNOSIS — E782 Mixed hyperlipidemia: Secondary | ICD-10-CM | POA: Diagnosis not present

## 2022-07-11 DIAGNOSIS — I10 Essential (primary) hypertension: Secondary | ICD-10-CM | POA: Diagnosis not present

## 2022-07-11 DIAGNOSIS — R519 Headache, unspecified: Secondary | ICD-10-CM

## 2022-07-11 MED ORDER — METOPROLOL TARTRATE 25 MG PO TABS
ORAL_TABLET | ORAL | 0 refills | Status: DC
Start: 2022-07-11 — End: 2022-11-11

## 2022-07-11 MED ORDER — AMLODIPINE BESYLATE 10 MG PO TABS
10.0000 mg | ORAL_TABLET | Freq: Every day | ORAL | 0 refills | Status: DC
Start: 2022-07-11 — End: 2022-11-22

## 2022-07-11 MED ORDER — SIMVASTATIN 40 MG PO TABS
40.0000 mg | ORAL_TABLET | Freq: Every day | ORAL | 0 refills | Status: DC
Start: 2022-07-11 — End: 2022-09-22

## 2022-07-11 MED ORDER — SUMATRIPTAN SUCCINATE 50 MG PO TABS
ORAL_TABLET | ORAL | 1 refills | Status: DC
Start: 2022-07-11 — End: 2023-01-16

## 2022-07-11 MED ORDER — LOSARTAN POTASSIUM 50 MG PO TABS
50.0000 mg | ORAL_TABLET | Freq: Every day | ORAL | 0 refills | Status: DC
Start: 2022-07-11 — End: 2023-01-16

## 2022-07-11 NOTE — Progress Notes (Signed)
Pt here for chronic care mgt  Complaining of headaches and dizziness on and off for X1 week   Neck pain for X1 week  No injury to same per pt

## 2022-07-12 LAB — BASIC METABOLIC PANEL
BUN/Creatinine Ratio: 13 (ref 12–28)
BUN: 13 mg/dL (ref 8–27)
CO2: 24 mmol/L (ref 20–29)
Calcium: 9.7 mg/dL (ref 8.7–10.3)
Chloride: 107 mmol/L — ABNORMAL HIGH (ref 96–106)
Creatinine, Ser: 0.99 mg/dL (ref 0.57–1.00)
Glucose: 101 mg/dL — ABNORMAL HIGH (ref 70–99)
Potassium: 3.9 mmol/L (ref 3.5–5.2)
Sodium: 144 mmol/L (ref 134–144)
eGFR: 64 mL/min/{1.73_m2} (ref >59)

## 2022-07-22 DIAGNOSIS — H25811 Combined forms of age-related cataract, right eye: Secondary | ICD-10-CM | POA: Diagnosis not present

## 2022-08-12 NOTE — Progress Notes (Unsigned)
Patient ID: Katherine Henson, female    DOB: Dec 23, 1960  MRN: [id number]  CC: Headache Follow-Up  Subjective: Katherine Henson is a 62 y.o. female who presents for headache follow-up.   Her concerns today include:  Sumatriptan   Patient Active Problem List   Diagnosis Date Noted   Abnormal findings on esophagogastroduodenoscopy (EGD) 07/22/2020   Dysphagia 07/22/2020   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   Helicobacter pylori infection 07/22/2020   Esophagitis determined by endoscopy 07/22/2020   History of cerebellar stroke 11/26/2019   Mixed hyperlipidemia 11/26/2019   Positive double stranded DNA antibody test 04/10/2019   Chronic hepatitis C without hepatic coma (HCC) 12/06/2018   HTN (hypertension) 04/30/2018     Current Outpatient Medications on File Prior to Visit  Medication Sig Dispense Refill   amLODipine (NORVASC) 10 MG tablet Take 1 tablet (10 mg total) by mouth daily. 90 tablet 0   aspirin 81 MG chewable tablet Chew 1 tablet (81 mg total) by mouth daily. 30 tablet 0   Azelastine-Fluticasone (DYMISTA) 137-50 MCG/ACT SUSP Place 1 spray into the nose every 12 (twelve) hours. 23 g 2   cetirizine (ZYRTEC ALLERGY) 10 MG tablet Take 1 tablet (10 mg total) by mouth at bedtime. 90 tablet 1   guaiFENesin (ROBITUSSIN) 100 MG/5ML liquid Take 10 mLs by mouth every 6 (six) hours as needed for cough or to loosen phlegm. 236 mL 0   losartan (COZAAR) 50 MG tablet Take 1 tablet (50 mg total) by mouth daily. 90 tablet 0   metoprolol tartrate (LOPRESSOR) 25 MG tablet Take 1/2 (one-half) tablet by mouth twice daily 90 tablet 0   pantoprazole (PROTONIX) 40 MG tablet Take 1 tablet (40 mg total) by mouth 2 (two) times daily. 180 tablet 3   simvastatin (ZOCOR) 40 MG tablet Take 1 tablet (40 mg total) by mouth daily at 6 PM. 90 tablet 0   SUMAtriptan (IMITREX) 50 MG tablet Take 50 mg (1 tablet total) by mouth at the start of the headache. May repeat in 2 hours x 1 if headache persists. Max  of 2 tablets/24 hours. 30 tablet 1   No current facility-administered medications on file prior to visit.    Allergies  Allergen Reactions   Penicillins Anaphylaxis    Did it involve swelling of the face/tongue/throat, SOB, or low BP? No Did it involve sudden or severe rash/hives, skin peeling, or any reaction on the inside of your mouth or nose? Yes Did you need to seek medical attention at a hospital or doctor's office? No When did it last happen?  30 years ago     If all above answers are "NO", may proceed with cephalosporin use.    Social History   Socioeconomic History   Marital status: Single    Spouse name: Not on file   Number of children: 5   Years of education: Not on file   Highest education level: Not on file  Occupational History   Occupation: housekeeper  Tobacco Use   Smoking status: Former    Packs/day: 0.50    Years: 30.00    Additional pack years: 0.00    Total pack years: 15.00    Types: Cigarettes    Quit date: 12/06/2010    Years since quitting: 11.6    Passive exposure: Past   Smokeless tobacco: Never  Vaping Use   Vaping Use: Never used  Substance and Sexual Activity   Alcohol use: Yes    Alcohol/week: 1.0 standard drink of  alcohol    Types: 1 Glasses of wine per week    Comment: occasional   Drug use: Not Currently    Types: Marijuana   Sexual activity: Not Currently  Other Topics Concern   Not on file  Social History Narrative   Left handed   One story home   No caffeine   Social Determinants of Health   Financial Resource Strain: Not on file  Food Insecurity: Food Insecurity Present (02/03/2021)   Hunger Vital Sign    Worried About Running Out of Food in the Last Year: Often true    Ran Out of Food in the Last Year: Sometimes true  Transportation Needs: Unmet Transportation Needs (02/03/2021)   PRAPARE - Administrator, Civil Service (Medical): Yes    Lack of Transportation (Non-Medical): Yes  Physical Activity: Not  on file  Stress: Not on file  Social Connections: Not on file  Intimate Partner Violence: Not on file    Family History  Problem Relation Age of Onset   Stroke Mother    Anuerysm Mother        brain   Diabetes Sister    Breast cancer Sister    Breast cancer Sister    Diabetes Brother    Prostate cancer Brother    Breast cancer Niece    Breast cancer Niece    Colon cancer Neg Hx    Esophageal cancer Neg Hx    Liver disease Neg Hx    Stomach cancer Neg Hx    Pancreatic cancer Neg Hx    Inflammatory bowel disease Neg Hx    Rectal cancer Neg Hx     Past Surgical History:  Procedure Laterality Date   ENDOSCOPIC MUCOSAL RESECTION  09/21/2020   Procedure: ENDOSCOPIC MUCOSAL RESECTION;  Surgeon: Meridee Score, Netty Starring., MD;  Location: [facility];  Service: Gastroenterology;;   ENDOSCOPIC MUCOSAL RESECTION N/A 10/11/2021   Procedure: ENDOSCOPIC MUCOSAL RESECTION;  Surgeon: Lemar Lofty., MD;  Location: [facility];  Service: Gastroenterology;  Laterality: N/A;   ESOPHAGOGASTRODUODENOSCOPY (EGD) WITH PROPOFOL N/A 09/21/2020   Procedure: ESOPHAGOGASTRODUODENOSCOPY (EGD) WITH PROPOFOL;  Surgeon: Meridee Score Netty Starring., MD;  Location: [facility];  Service: Gastroenterology;  Laterality: N/A;   ESOPHAGOGASTRODUODENOSCOPY (EGD) WITH PROPOFOL N/A 10/11/2021   Procedure: ESOPHAGOGASTRODUODENOSCOPY (EGD) WITH PROPOFOL;  Surgeon: Meridee Score Netty Starring., MD;  Location: [facility];  Service: Gastroenterology;  Laterality: N/A;   HEMOSTASIS CLIP PLACEMENT  09/21/2020   Procedure: HEMOSTASIS CLIP PLACEMENT;  Surgeon: Lemar Lofty., MD;  Location: [facility];  Service: Gastroenterology;;   HEMOSTASIS CLIP PLACEMENT  10/11/2021   Procedure: HEMOSTASIS CLIP PLACEMENT;  Surgeon: Lemar Lofty., MD;  Location: [facility];  Service: Gastroenterology;;   NO PAST SURGERIES     POLYPECTOMY  10/11/2021   Procedure: POLYPECTOMY;  Surgeon: Lemar Lofty., MD;   Location: [facility];  Service: Gastroenterology;;   SUBMUCOSAL LIFTING INJECTION  09/21/2020   Procedure: SUBMUCOSAL LIFTING INJECTION;  Surgeon: Lemar Lofty., MD;  Location: [facility];  Service: Gastroenterology;;   SUBMUCOSAL LIFTING INJECTION  10/11/2021   Procedure: SUBMUCOSAL LIFTING INJECTION;  Surgeon: Lemar Lofty., MD;  Location: [facility];  Service: Gastroenterology;;   SUBMUCOSAL TATTOO INJECTION  10/11/2021   Procedure: SUBMUCOSAL TATTOO INJECTION;  Surgeon: Lemar Lofty., MD;  Location: [facility];  Service: Gastroenterology;;    ROS: Review of Systems Negative except as stated above  PHYSICAL EXAM: LMP 09/27/2012   Physical Exam  {female adult master:310786} {female adult master:310785}  Latest Ref Rng & Units 07/11/2022    3:57 PM 02/21/2022    3:54 PM 07/09/2021    2:03 PM  CMP  Glucose 70 - 99 mg/dL 213  74  086   BUN 8 - 27 mg/dL 13  14  13    Creatinine 0.57 - 1.00 mg/dL 5.78  4.69  6.29   Sodium 134 - 144 mmol/L 144  145  145   Potassium 3.5 - 5.2 mmol/L 3.9  4.2  4.2   Chloride 96 - 106 mmol/L 107  108  107   CO2 20 - 29 mmol/L 24  22  23    Calcium 8.7 - 10.3 mg/dL 9.7  52.8  9.6    Lipid Panel     Component Value Date/Time   CHOL 181 02/21/2022 1554   TRIG 90 02/21/2022 1554   HDL 67 02/21/2022 1554   CHOLHDL 2.7 02/21/2022 1554   CHOLHDL 4.6 05/01/2018 0338   VLDL 52 (H) 05/01/2018 0338   LDLCALC 98 02/21/2022 1554    CBC    Component Value Date/Time   WBC 7.5 07/21/2020 1607   RBC 4.30 07/21/2020 1607   HGB 11.8 (L) 07/21/2020 1607   HGB 12.2 04/03/2019 1001   HCT 35.8 (L) 07/21/2020 1607   HCT 36.9 04/03/2019 1001   PLT 305.0 07/21/2020 1607   PLT 316 04/03/2019 1001   MCV 83.2 07/21/2020 1607   MCV 85 04/03/2019 1001   MCH 27.4 11/13/2019 1722   MCHC 32.9 07/21/2020 1607   RDW 15.8 (H) 07/21/2020 1607   RDW 14.8 04/03/2019 1001   LYMPHSABS 2.3 01/28/2019 1452   MONOABS 0.6 01/28/2019 1452    EOSABS 0.1 01/28/2019 1452   BASOSABS 0.0 01/28/2019 1452    ASSESSMENT AND PLAN:  There are no diagnoses linked to this encounter.   Patient was given the opportunity to ask questions.  Patient verbalized understanding of the plan and was able to repeat key elements of the plan. Patient was given clear instructions to go to Emergency Department or return to medical center if symptoms don't improve, worsen, or new problems develop.The patient verbalized understanding.   No orders of the defined types were placed in this encounter.    Requested Prescriptions    No prescriptions requested or ordered in this encounter    No follow-ups on file.  Rema Fendt, NP

## 2022-08-15 ENCOUNTER — Encounter: Payer: Medicaid Other | Admitting: Family

## 2022-08-15 DIAGNOSIS — R519 Headache, unspecified: Secondary | ICD-10-CM

## 2022-09-21 ENCOUNTER — Other Ambulatory Visit: Payer: Self-pay | Admitting: Family

## 2022-09-21 DIAGNOSIS — E782 Mixed hyperlipidemia: Secondary | ICD-10-CM

## 2022-09-22 ENCOUNTER — Ambulatory Visit (HOSPITAL_COMMUNITY)
Admission: EM | Admit: 2022-09-22 | Discharge: 2022-09-22 | Disposition: A | Discharge status: Home / Self Care | Payer: Medicaid Other | Attending: Family Medicine | Admitting: Family Medicine

## 2022-09-22 ENCOUNTER — Encounter (HOSPITAL_COMMUNITY): Payer: Self-pay

## 2022-09-22 DIAGNOSIS — U071 COVID-19: Secondary | ICD-10-CM | POA: Diagnosis not present

## 2022-09-22 DIAGNOSIS — J0140 Acute pansinusitis, unspecified: Secondary | ICD-10-CM | POA: Diagnosis not present

## 2022-09-22 DIAGNOSIS — Z87891 Personal history of nicotine dependence: Secondary | ICD-10-CM | POA: Diagnosis not present

## 2022-09-22 DIAGNOSIS — R059 Cough, unspecified: Secondary | ICD-10-CM | POA: Diagnosis present

## 2022-09-22 MED ORDER — PROMETHAZINE-DM 6.25-15 MG/5ML PO SYRP: 5.0000 mL | ORAL_SOLUTION | Freq: Three times a day (TID) | ORAL | 0 refills | Status: AC | PRN

## 2022-09-22 MED ORDER — AZITHROMYCIN 250 MG PO TABS: ORAL_TABLET | ORAL | 0 refills | Status: DC

## 2022-09-22 NOTE — ED Provider Notes (Signed)
[facility]    CSN: [id number] Arrival date & time: 09/22/22  1019      History   Chief Complaint Chief Complaint  Patient presents with  . Cough    HPI Katherine Henson is a 62 y.o. female.   HPI Patient presents with URI symptoms including cough, sore throat, otalgia, nasal congestion, runny nose, and sinus pressure. Unknown of COVID exposure. COVID Vaccinated: Y/N Flu vaccinated: Y/N Denies worrisome symptoms of shortness of breath, weakness, N&V, chest pain or leg pain.  Past Medical History:  Diagnosis Date  . Allergy   . Chronic hepatitis C (HCC) 12/06/2018  . Depression   . HTN (hypertension)   . Hyperlipidemia   . Stroke [facility])     Patient Active Problem List   Diagnosis Date Noted  . Abnormal findings on esophagogastroduodenoscopy (EGD) 07/22/2020  . Dysphagia 07/22/2020  . 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 Helicobacter pylori infection 07/22/2020  . Esophagitis determined by endoscopy 07/22/2020  . History of cerebellar stroke 11/26/2019  . Mixed hyperlipidemia 11/26/2019  . Positive double stranded DNA antibody test 04/10/2019  . Chronic hepatitis C without hepatic coma (HCC) 12/06/2018  . HTN (hypertension) 04/30/2018    Past Surgical History:  Procedure Laterality Date  . ENDOSCOPIC MUCOSAL RESECTION  09/21/2020   Procedure: ENDOSCOPIC MUCOSAL RESECTION;  Surgeon: Meridee Score Netty Starring., MD;  Location: [facility];  Service: Gastroenterology;;  . ENDOSCOPIC MUCOSAL RESECTION N/A 10/11/2021   Procedure: ENDOSCOPIC MUCOSAL RESECTION;  Surgeon: Lemar Lofty., MD;  Location: [facility];  Service: Gastroenterology;  Laterality: N/A;  . ESOPHAGOGASTRODUODENOSCOPY (EGD) WITH PROPOFOL N/A 09/21/2020   Procedure: ESOPHAGOGASTRODUODENOSCOPY (EGD) WITH PROPOFOL;  Surgeon: Meridee Score Netty Starring., MD;  Location: [facility];  Service: Gastroenterology;  Laterality: N/A;  . ESOPHAGOGASTRODUODENOSCOPY (EGD) WITH PROPOFOL N/A 10/11/2021    Procedure: ESOPHAGOGASTRODUODENOSCOPY (EGD) WITH PROPOFOL;  Surgeon: Meridee Score Netty Starring., MD;  Location: [facility];  Service: Gastroenterology;  Laterality: N/A;  . HEMOSTASIS CLIP PLACEMENT  09/21/2020   Procedure: HEMOSTASIS CLIP PLACEMENT;  Surgeon: Lemar Lofty., MD;  Location: [facility];  Service: Gastroenterology;;  . HEMOSTASIS CLIP PLACEMENT  10/11/2021   Procedure: HEMOSTASIS CLIP PLACEMENT;  Surgeon: Lemar Lofty., MD;  Location: [facility];  Service: Gastroenterology;;  . NO PAST SURGERIES    . POLYPECTOMY  10/11/2021   Procedure: POLYPECTOMY;  Surgeon: Mansouraty, Netty Starring., MD;  Location: [facility];  Service: Gastroenterology;;  . Sunnie Nielsen LIFTING INJECTION  09/21/2020   Procedure: SUBMUCOSAL LIFTING INJECTION;  Surgeon: Lemar Lofty., MD;  Location: [facility];  Service: Gastroenterology;;  . Sunnie Nielsen LIFTING INJECTION  10/11/2021   Procedure: SUBMUCOSAL LIFTING INJECTION;  Surgeon: Lemar Lofty., MD;  Location: [facility];  Service: Gastroenterology;;  . Sunnie Nielsen TATTOO INJECTION  10/11/2021   Procedure: SUBMUCOSAL TATTOO INJECTION;  Surgeon: Lemar Lofty., MD;  Location: [facility];  Service: Gastroenterology;;    OB History     Gravida  5   Para  5   Term  5   Preterm  0   AB  0   Living  5      SAB  0   IAB  0   Ectopic  0   Multiple  0   Live Births  5            Home Medications    Prior to Admission medications   Medication Sig Start Date End Date Taking? Authorizing Provider  amLODipine (NORVASC) 10 MG tablet Take 1 tablet (10 mg  total) by mouth daily. 07/11/22 10/09/22  Rema Fendt, NP  aspirin 81 MG chewable tablet Chew 1 tablet (81 mg total) by mouth daily. 10/05/20   Mansouraty, Netty Starring., MD  Azelastine-Fluticasone [facility]) 137-50 MCG/ACT SUSP Place 1 spray into the nose every 12 (twelve) hours. 05/10/22 06/09/22  Theadora Rama Scales, PA-C  cetirizine (ZYRTEC  ALLERGY) 10 MG tablet Take 1 tablet (10 mg total) by mouth at bedtime. 05/10/22 11/06/22  Theadora Rama Scales, PA-C  guaiFENesin (ROBITUSSIN) 100 MG/5ML liquid Take 10 mLs by mouth every 6 (six) hours as needed for cough or to loosen phlegm. 05/10/22   Theadora Rama Scales, PA-C  losartan (COZAAR) 50 MG tablet Take 1 tablet (50 mg total) by mouth daily. 07/11/22 10/09/22  Rema Fendt, NP  metoprolol tartrate (LOPRESSOR) 25 MG tablet Take 1/2 (one-half) tablet by mouth twice daily 07/11/22   Rema Fendt, NP  pantoprazole (PROTONIX) 40 MG tablet Take 1 tablet (40 mg total) by mouth 2 (two) times daily. 10/11/21   Mansouraty, Netty Starring., MD  simvastatin (ZOCOR) 40 MG tablet Take 1 tablet (40 mg total) by mouth daily at 6 PM. 07/11/22 10/09/22  Rema Fendt, NP  SUMAtriptan (IMITREX) 50 MG tablet Take 50 mg (1 tablet total) by mouth at the start of the headache. May repeat in 2 hours x 1 if headache persists. Max of 2 tablets/24 hours. 07/11/22   Rema Fendt, NP    Family History Family History  Problem Relation Age of Onset  . Stroke Mother   . Anuerysm Mother        brain  . Diabetes Sister   . Breast cancer Sister   . Breast cancer Sister   . Diabetes Brother   . Prostate cancer Brother   . Breast cancer Niece   . Breast cancer Niece   . Colon cancer Neg Hx   . Esophageal cancer Neg Hx   . Liver disease Neg Hx   . Stomach cancer Neg Hx   . Pancreatic cancer Neg Hx   . Inflammatory bowel disease Neg Hx   . Rectal cancer Neg Hx     Social History Social History   Tobacco Use  . Smoking status: Former    Current packs/day: 0.00    Average packs/day: 0.5 packs/day for 30.0 years (15.0 ttl pk-yrs)    Types: Cigarettes    Start date: 12/05/1980    Quit date: 12/06/2010    Years since quitting: 11.8    Passive exposure: Past  . Smokeless tobacco: Never  Vaping Use  . Vaping status: Never Used  Substance Use Topics  . Alcohol use: Yes    Alcohol/week: 1.0 standard drink of  alcohol    Types: 1 Glasses of wine per week    Comment: occasional  . Drug use: Not Currently    Types: Marijuana     Allergies   Penicillins   Review of Systems Review of Systems   Physical Exam Triage Vital Signs ED Triage Vitals  Encounter Vitals Group     BP 09/22/22 1124 127/75     Systolic BP Percentile --      Diastolic BP Percentile --      Pulse Rate 09/22/22 1124 (!) 50     Resp 09/22/22 1124 16     Temp 09/22/22 1124 98.2 F (36.8 C)     Temp Source 09/22/22 1124 Oral     SpO2 09/22/22 1124 96 %     Weight --  Height --      Head Circumference --      Peak Flow --      Pain Score 09/22/22 1125 0     Pain Loc --      Pain Education --      Exclude from Growth Chart --    No data found.  Updated Vital Signs BP 127/75 (BP Location: Left Arm)   Pulse (!) 50   Temp 98.2 F (36.8 C) (Oral)   Resp 16   LMP 09/27/2012   SpO2 96%   Visual Acuity Right Eye Distance:   Left Eye Distance:   Bilateral Distance:    Right Eye Near:   Left Eye Near:    Bilateral Near:     Physical Exam   UC Treatments / Results  Labs (all labs ordered are listed, but only abnormal results are displayed) Labs Reviewed - No data to display  EKG   Radiology No results found.  Procedures Procedures (including critical care time)  Medications Ordered in UC Medications - No data to display  Initial Impression / Assessment and Plan / UC Course  I have reviewed the triage vital signs and the nursing notes.  Pertinent labs & imaging results that were available during my care of the patient were reviewed by me and considered in my medical decision making (see chart for details).     *** Final Clinical Impressions(s) / UC Diagnoses   Final diagnoses:  None   Discharge Instructions   None    ED Prescriptions   None    PDMP not reviewed this encounter.

## 2022-09-22 NOTE — ED Triage Notes (Signed)
Pt states cough and congestion, body aches for the past 4 days.  Has been taking OTC cold medicine with no relief.

## 2022-09-22 NOTE — Discharge Instructions (Addendum)
I am treating you for sinus infection.  However will check you for COVID given your symptoms of generalized fatigue and bodyaches. Recommend taking Tylenol for body aches.  I have prescribed you azithromycin to treat your sinus symptoms.  Continue taking Zyrtec. I have also prescribed you Promethazine DM as needed for cough.

## 2022-09-22 NOTE — Telephone Encounter (Signed)
Requested Prescriptions  Pending Prescriptions Disp Refills   simvastatin (ZOCOR) 40 MG tablet [Pharmacy Med Name: Simvastatin 40 MG Oral Tablet] 90 tablet 1    Sig: TAKE 1 TABLET BY MOUTH DAILY AT 6PM     Cardiovascular:  Antilipid - Statins Failed - 09/21/2022  6:12 PM      Failed - Lipid Panel in normal range within the last 12 months    Cholesterol, Total  Date Value Ref Range Status  02/21/2022 181 100 - 199 mg/dL Final   LDL Chol Calc (NIH)  Date Value Ref Range Status  02/21/2022 98 0 - 99 mg/dL Final   HDL  Date Value Ref Range Status  02/21/2022 67 >39 mg/dL Final   Triglycerides  Date Value Ref Range Status  02/21/2022 90 0 - 149 mg/dL Final         Passed - Patient is not pregnant      Passed - Valid encounter within last 12 months    Recent Outpatient Visits           2 months ago Primary hypertension   [place] [facility], [place], NP   5 months ago Primary hypertension   [place] [place] [facility], Amy J, NP   7 months ago Primary hypertension   [place] [facility], Amy J, NP   10 months ago Essential (primary) hypertension   [place] [facility], [place], NP   1 year ago Essential (primary) hypertension   [place] [facility], [place], NP       Future Appointments             In 3 months Rema Fendt, NP [facility]

## 2022-09-23 ENCOUNTER — Other Ambulatory Visit: Payer: Self-pay

## 2022-09-23 DIAGNOSIS — D132 Benign neoplasm of duodenum: Secondary | ICD-10-CM

## 2022-09-23 DIAGNOSIS — K317 Polyp of stomach and duodenum: Secondary | ICD-10-CM

## 2022-09-23 LAB — SARS CORONAVIRUS 2 (TAT 6-24 HRS): SARS Coronavirus 2: POSITIVE — AB

## 2022-10-04 DIAGNOSIS — H5213 Myopia, bilateral: Secondary | ICD-10-CM | POA: Diagnosis not present

## 2022-11-11 ENCOUNTER — Other Ambulatory Visit: Payer: Self-pay

## 2022-11-11 ENCOUNTER — Other Ambulatory Visit: Payer: Self-pay | Admitting: Family

## 2022-11-11 DIAGNOSIS — I10 Essential (primary) hypertension: Secondary | ICD-10-CM

## 2022-11-11 MED ORDER — METOPROLOL TARTRATE 25 MG PO TABS: ORAL_TABLET | ORAL | 0 refills | Status: DC

## 2022-11-14 DIAGNOSIS — H524 Presbyopia: Secondary | ICD-10-CM | POA: Diagnosis not present

## 2022-11-17 ENCOUNTER — Ambulatory Visit (AMBULATORY_SURGERY_CENTER): Payer: Medicaid Other | Admitting: *Deleted

## 2022-11-17 VITALS — Ht 60.0 in | Wt 150.0 lb

## 2022-11-17 DIAGNOSIS — D132 Benign neoplasm of duodenum: Secondary | ICD-10-CM

## 2022-11-17 NOTE — Progress Notes (Signed)
No egg or soy allergy known to patient  No issues known to pt with past sedation with any surgeries or procedures Patient denies ever being told they had issues or difficulty with intubation  No FH of Malignant Hyperthermia Pt is not on diet pills Pt is not on  home 02  Pt is not on blood thinners  Pt denies issues with constipation  No A fib or A flutter Have any cardiac testing pending--no Pt instructed to use [email or web address] or GoodRx for a price reduction on prep   

## 2022-11-22 ENCOUNTER — Other Ambulatory Visit: Payer: Self-pay | Admitting: Family

## 2022-11-22 DIAGNOSIS — I10 Essential (primary) hypertension: Secondary | ICD-10-CM

## 2022-11-24 ENCOUNTER — Other Ambulatory Visit: Payer: Self-pay

## 2022-11-24 ENCOUNTER — Encounter (HOSPITAL_COMMUNITY): Payer: Self-pay | Admitting: Gastroenterology

## 2022-11-24 NOTE — Progress Notes (Signed)
PCP - Delfin Gant , NP  LOV 07-11-22 Cardiologist -   PPM/ICD -  Device Orders -  Rep Notified -   Chest x-ray - 05-10-22 EKG -  Stress Test -2011  ECHO -  Cardiac Cath -   Sleep Study -  CPAP -   Fasting Blood Sugar -  Checks Blood Sugar _____ times a day  Blood Thinner Instructions: Aspirin Instructions:  ERAS Protcol - PRE-SURGERY Ensure or G2-   COVID TEST-  COVID vaccine -  Activity--Able to climb a flight of stairs without CP or SOB Anesthesia review: Stroke, Hep C pre dm  Patient denies shortness of breath, fever, cough and chest pain at PAT appointment   All instructions explained to the patient, with a verbal understanding of the material. Patient agrees to go over the instructions while at home for a better understanding. Patient also instructed to self quarantine after being tested for COVID-19. The opportunity to ask questions was provided.

## 2022-11-29 ENCOUNTER — Telehealth: Payer: Self-pay | Admitting: Gastroenterology

## 2022-11-29 NOTE — Telephone Encounter (Signed)
Mansouraty, Netty Starring., MD  Viviana Simpler, RN; Loretha Stapler, RN [facility], Thank you for update.  Neha Waight, Put in a recall for 75-months. Patient can call us when she is ready to reschedule but we'll have this documentation in the chart of her cancelling. GM

## 2022-11-29 NOTE — Progress Notes (Signed)
Pt called to reschedule. I told her Id take her off our schedule but she needed to call dr. Elesa Hacker office to reschedule. She agreed

## 2022-11-29 NOTE — Telephone Encounter (Signed)
The pt has been rescheduled to 01/26/23 at 1045 am at [facility] with GM.  New instructions have been mailed and given over the phone.  No further questions or concerns.

## 2022-11-29 NOTE — Telephone Encounter (Signed)
Inbound call from patient, would like to reschedule Colonoscopy at [facility] with Dr. Meridee Score.   Thank you.

## 2022-12-01 ENCOUNTER — Ambulatory Visit (HOSPITAL_COMMUNITY): Admission: RE | Admit: 2022-12-01 | Payer: Medicaid Other | Source: Home / Self Care | Admitting: Gastroenterology

## 2022-12-01 HISTORY — DX: Unspecified osteoarthritis, unspecified site: M19.90

## 2022-12-01 SURGERY — ESOPHAGOGASTRODUODENOSCOPY (EGD) WITH PROPOFOL
Anesthesia: Monitor Anesthesia Care

## 2022-12-28 ENCOUNTER — Other Ambulatory Visit: Payer: Self-pay | Admitting: Family

## 2022-12-28 DIAGNOSIS — I10 Essential (primary) hypertension: Secondary | ICD-10-CM

## 2023-01-16 ENCOUNTER — Encounter: Payer: Self-pay | Admitting: Family

## 2023-01-16 ENCOUNTER — Ambulatory Visit: Payer: Medicaid Other | Admitting: Family

## 2023-01-16 ENCOUNTER — Ambulatory Visit: Payer: Medicaid Other

## 2023-01-16 VITALS — BP 115/76 | HR 60 | Temp 98.4°F | Ht 60.0 in | Wt 141.2 lb

## 2023-01-16 DIAGNOSIS — E785 Hyperlipidemia, unspecified: Secondary | ICD-10-CM

## 2023-01-16 DIAGNOSIS — M25511 Pain in right shoulder: Secondary | ICD-10-CM

## 2023-01-16 DIAGNOSIS — Z23 Encounter for immunization: Secondary | ICD-10-CM | POA: Diagnosis not present

## 2023-01-16 DIAGNOSIS — I10 Essential (primary) hypertension: Secondary | ICD-10-CM | POA: Diagnosis not present

## 2023-01-16 MED ORDER — LOSARTAN POTASSIUM 50 MG PO TABS: 50.0000 mg | ORAL_TABLET | Freq: Every day | ORAL | 0 refills | Status: DC

## 2023-01-16 MED ORDER — METOPROLOL TARTRATE 25 MG PO TABS: ORAL_TABLET | ORAL | 0 refills | Status: DC

## 2023-01-16 MED ORDER — AMLODIPINE BESYLATE 10 MG PO TABS: 10.0000 mg | ORAL_TABLET | Freq: Every day | ORAL | 0 refills | Status: DC

## 2023-01-16 MED ORDER — SIMVASTATIN 40 MG PO TABS: 40.0000 mg | ORAL_TABLET | Freq: Every day | ORAL | 0 refills | Status: DC

## 2023-01-16 MED ORDER — GABAPENTIN 300 MG PO CAPS
300.0000 mg | ORAL_CAPSULE | Freq: Every day | ORAL | 1 refills | Status: DC
Start: 2023-01-16 — End: 2023-04-18

## 2023-01-16 NOTE — Progress Notes (Signed)
Patient ID: Katherine Henson, female    DOB: Sep 12, 1960  MRN: [id number]  CC: Chronic Conditions Follow-Up  Subjective: Katherine Henson is a 62 y.o. female who presents for chronic conditions follow-up.   Her concerns today include:  - Doing well on Amlodipine, Losartan, and Metoprolol Tartrate no issues/concerns. She does not complain of red flag symptoms such as but not limited to chest pain, shortness of breath, worst headache of life, nausea/vomiting.  - Doing well on Simvastatin, no issues/concerns.  - Reports right shoulder pain x 1 week. Reports pain radiating to right side of neck and right arm. States decreased grip of right hand. She denies recent trauma/injury and red flag symptoms. Taking over-the-counter Tylenol with minimal relief.    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 30 tablet 0   Azelastine-Fluticasone (DYMISTA) 137-50 MCG/ACT SUSP Place 1 spray into the nose every 12 (twelve) hours. 23 g 2   cetirizine (ZYRTEC ALLERGY) 10 MG tablet Take 1 tablet (10 mg total) by mouth at bedtime. 90 tablet 1   losartan (COZAAR) 50 MG tablet Take 50 mg by mouth daily.     pantoprazole (PROTONIX) 40 MG tablet Take 1 tablet (40 mg total) by mouth 2 (two) times daily. 180 tablet 3   promethazine-dextromethorphan (PROMETHAZINE-DM) 6.25-15 MG/5ML syrup Take 5 mLs by  mouth 3 (three) times daily as needed for cough. 240 mL 0   No current facility-administered medications on file prior to visit.    Allergies  Allergen Reactions   Penicillins Anaphylaxis    Did it involve swelling of the face/tongue/throat, SOB, or low BP? No Did it involve sudden or severe rash/hives, skin peeling, or any reaction on the inside of your mouth or nose? Yes Did you need to seek medical attention at a hospital or doctor's office? No When did it last happen?  30 years ago     If all above answers are "NO", may proceed with cephalosporin use.    Social History   Socioeconomic History   Marital status: Single    Spouse name: Not on file   Number of children: 5   Years of education: Not on file   Highest education level: Not on file  Occupational History   Occupation: housekeeper  Tobacco Use   Smoking status: Former    Current packs/day: 0.00    Average packs/day: 0.5 packs/day for 30.0 years (15.0 ttl pk-yrs)    Types: Cigarettes    Start date: 12/05/1980    Quit date: 12/06/2010    Years since quitting: 12.1    Passive exposure: Past   Smokeless tobacco: Never   Tobacco comments:    Quit again 2021  Vaping Use   Vaping status: Never Used  Substance and Sexual Activity   Alcohol use: Yes    Alcohol/week: 1.0 standard drink of alcohol    Types: 1 Glasses of wine per week  Comment: occasional   Drug use: Not Currently    Types: Marijuana   Sexual activity: Not Currently  Other Topics Concern   Not on file  Social History Narrative   Left handed   One story home   No caffeine   Social Determinants of Health   Financial Resource Strain: Not on file  Food Insecurity: Food Insecurity Present (02/03/2021)   Hunger Vital Sign    Worried About Running Out of Food in the Last Year: Often true    Ran Out of Food in the Last Year: Sometimes true  Transportation Needs: Unmet Transportation Needs (02/03/2021)   PRAPARE - Scientist, research (physical sciences) (Medical): Yes    Lack of Transportation (Non-Medical): Yes  Physical Activity: Not on file  Stress: Not on file  Social Connections: Not on file  Intimate Partner Violence: Not on file    Family History  Problem Relation Age of Onset   Stroke Mother    Anuerysm Mother        brain   Diabetes Sister    Breast cancer Sister    Breast cancer Sister    Diabetes Brother    Prostate cancer Brother    Breast cancer Niece    Breast cancer Niece    Colon cancer Neg Hx    Esophageal cancer Neg Hx    Liver disease Neg Hx    Stomach cancer Neg Hx    Pancreatic cancer Neg Hx    Inflammatory bowel disease Neg Hx    Rectal cancer Neg Hx    Colon polyps Neg Hx    Crohn's disease Neg Hx    Ulcerative colitis Neg Hx     Past Surgical History:  Procedure Laterality Date   COLONOSCOPY     ENDOSCOPIC MUCOSAL RESECTION  09/21/2020   Procedure: ENDOSCOPIC MUCOSAL RESECTION;  Surgeon: Meridee Score, Netty Starring., MD;  Location: [facility];  Service: Gastroenterology;;   ENDOSCOPIC MUCOSAL RESECTION N/A 10/11/2021   Procedure: ENDOSCOPIC MUCOSAL RESECTION;  Surgeon: Lemar Lofty., MD;  Location: [facility];  Service: Gastroenterology;  Laterality: N/A;   ESOPHAGOGASTRODUODENOSCOPY (EGD) WITH PROPOFOL N/A 09/21/2020   Procedure: ESOPHAGOGASTRODUODENOSCOPY (EGD) WITH PROPOFOL;  Surgeon: Meridee Score Netty Starring., MD;  Location: [facility];  Service: Gastroenterology;  Laterality: N/A;   ESOPHAGOGASTRODUODENOSCOPY (EGD) WITH PROPOFOL N/A 10/11/2021   Procedure: ESOPHAGOGASTRODUODENOSCOPY (EGD) WITH PROPOFOL;  Surgeon: Meridee Score Netty Starring., MD;  Location: [facility];  Service: Gastroenterology;  Laterality: N/A;   HEMOSTASIS CLIP PLACEMENT  09/21/2020   Procedure: HEMOSTASIS CLIP PLACEMENT;  Surgeon: Lemar Lofty., MD;  Location: [facility];  Service: Gastroenterology;;   HEMOSTASIS CLIP PLACEMENT  10/11/2021   Procedure: HEMOSTASIS CLIP PLACEMENT;   Surgeon: Lemar Lofty., MD;  Location: [facility];  Service: Gastroenterology;;   NO PAST SURGERIES     POLYPECTOMY  10/11/2021   Procedure: POLYPECTOMY;  Surgeon: Lemar Lofty., MD;  Location: [facility];  Service: Gastroenterology;;   SUBMUCOSAL LIFTING INJECTION  09/21/2020   Procedure: SUBMUCOSAL LIFTING INJECTION;  Surgeon: Lemar Lofty., MD;  Location: [facility];  Service: Gastroenterology;;   SUBMUCOSAL LIFTING INJECTION  10/11/2021   Procedure: SUBMUCOSAL LIFTING INJECTION;  Surgeon: Lemar Lofty., MD;  Location: [facility];  Service: Gastroenterology;;   SUBMUCOSAL TATTOO INJECTION  10/11/2021   Procedure: SUBMUCOSAL TATTOO INJECTION;  Surgeon: Lemar Lofty., MD;  Location: [facility];  Service: Gastroenterology;;    ROS: Review of Systems Negative except as stated above  PHYSICAL EXAM: BP 115/76  Pulse 60   Temp 98.4 F (36.9 C) (Oral)   Ht 5' (1.524 m)   Wt 141 lb 3.2 oz (64 kg)   LMP 09/27/2012   SpO2 98%   BMI 27.58 kg/m   Physical Exam HENT:     Head: Normocephalic and atraumatic.     Nose: Nose normal.     Mouth/Throat:     Mouth: Mucous membranes are moist.     Pharynx: Oropharynx is clear.  Eyes:     Extraocular Movements: Extraocular movements intact.     Conjunctiva/sclera: Conjunctivae normal.     Pupils: Pupils are equal, round, and reactive to light.  Cardiovascular:     Rate and Rhythm: Normal rate and regular rhythm.     Pulses: Normal pulses.     Heart sounds: Normal heart sounds.  Pulmonary:     Effort: Pulmonary effort is normal.     Breath sounds: Normal breath sounds.  Musculoskeletal:     Right shoulder: Decreased range of motion.     Left shoulder: Normal.     Right upper arm: Normal.     Left upper arm: Normal.     Right elbow: Normal.     Left elbow: Normal.     Right forearm: Normal.     Left forearm: Normal.     Right wrist: Normal.     Left wrist: Normal.      Right hand: Normal.     Left hand: Normal.     Cervical back: Normal, normal range of motion and neck supple.     Thoracic back: Normal.     Lumbar back: Normal.     Right hip: Normal.     Left hip: Normal.     Right upper leg: Normal.     Left upper leg: Normal.     Right knee: Normal.     Left knee: Normal.     Right lower leg: Normal.     Left lower leg: Normal.     Right ankle: Normal.     Left ankle: Normal.     Right foot: Normal.     Left foot: Normal.  Neurological:     General: No focal deficit present.     Mental Status: She is alert and oriented to person, place, and time.  Psychiatric:        Mood and Affect: Mood normal.        Behavior: Behavior normal.      ASSESSMENT AND PLAN: 1. Primary hypertension - Continue Amlodipine, Losartan, and Metoprolol Tartrate as prescribed.  - Routine screening.  - Counseled on blood pressure goal of less than 130/80, low-sodium, DASH diet, medication compliance, and 150 minutes of moderate intensity exercise per week as tolerated. Counseled on medication adherence and adverse effects. - Follow-up with primary provider in 3 months or sooner if needed.  - Basic Metabolic Panel - amLODipine (NORVASC) 10 MG tablet; Take 1 tablet (10 mg total) by mouth daily.  Dispense: 90 tablet; Refill: 0 - losartan (COZAAR) 50 MG tablet; Take 1 tablet (50 mg total) by mouth daily.  Dispense: 90 tablet; Refill: 0 - metoprolol tartrate (LOPRESSOR) 25 MG tablet; Take 1/2 (one-half) tablet by mouth twice daily  Dispense: 90 tablet; Refill: 0  2. Hyperlipidemia, unspecified hyperlipidemia type - Continue Simvastatin as prescribed. Counseled on medication adherence/adverse effects.  - Routine screening.  - Follow-up with primary provider as scheduled.  - Lipid panel - simvastatin (ZOCOR) 40 MG tablet; Take 1 tablet (40  mg total) by mouth daily at 6 PM.  Dispense: 90 tablet; Refill: 0  3. Right shoulder pain, unspecified chronicity - Gabapentin as  prescribed. Counseled on medication adherence/adverse effects.  - Diagnostic xray right shoulder for further evaluation.  - Referral to Orthopedic Surgery for evaluation/management. During the interim follow-up with primary provider as scheduled until established with referral.  - gabapentin (NEURONTIN) 300 MG capsule; Take 1 capsule (300 mg total) by mouth at bedtime.  Dispense: 30 capsule; Refill: 1 - DG Shoulder Right; Future - Ambulatory referral to Orthopedic Surgery     Patient was given the opportunity to ask questions.  Patient verbalized understanding of the plan and was able to repeat key elements of the plan. Patient was given clear instructions to go to Emergency Department or return to medical center if symptoms don't improve, worsen, or new problems develop.The patient verbalized understanding.   Orders Placed This Encounter  Procedures   DG Shoulder Right   Lipid panel   Basic Metabolic Panel   Ambulatory referral to Orthopedic Surgery     Requested Prescriptions   Signed Prescriptions Disp Refills   amLODipine (NORVASC) 10 MG tablet 90 tablet 0    Sig: Take 1 tablet (10 mg total) by mouth daily.   losartan (COZAAR) 50 MG tablet 90 tablet 0    Sig: Take 1 tablet (50 mg total) by mouth daily.   simvastatin (ZOCOR) 40 MG tablet 90 tablet 0    Sig: Take 1 tablet (40 mg total) by mouth daily at 6 PM.   gabapentin (NEURONTIN) 300 MG capsule 30 capsule 1    Sig: Take 1 capsule (300 mg total) by mouth at bedtime.   metoprolol tartrate (LOPRESSOR) 25 MG tablet 90 tablet 0    Sig: Take 1/2 (one-half) tablet by mouth twice daily    Return in about 3 months (around 04/18/2023) for Follow-Up or next available chronic conditions.  Rema Fendt, NP

## 2023-01-16 NOTE — Progress Notes (Signed)
Patient states right side she can't lay on. States it feels tingly.   Patient wants Flu Vaccine.

## 2023-01-17 LAB — BASIC METABOLIC PANEL
BUN/Creatinine Ratio: 15 (ref 12–28)
BUN: 12 mg/dL (ref 8–27)
CO2: 23 mmol/L (ref 20–29)
Calcium: 9.9 mg/dL (ref 8.7–10.3)
Chloride: 106 mmol/L (ref 96–106)
Creatinine, Ser: 0.78 mg/dL (ref 0.57–1.00)
Glucose: 75 mg/dL (ref 70–99)
Potassium: 4.2 mmol/L (ref 3.5–5.2)
Sodium: 145 mmol/L — ABNORMAL HIGH (ref 134–144)
eGFR: 85 mL/min/{1.73_m2} (ref >59)

## 2023-01-17 LAB — LIPID PANEL
Chol/HDL Ratio: 3 ratio (ref 0.0–4.4)
Cholesterol, Total: 178 mg/dL (ref 100–199)
HDL: 60 mg/dL (ref >39)
LDL Chol Calc (NIH): 105 mg/dL — ABNORMAL HIGH (ref 0–99)
Triglycerides: 71 mg/dL (ref 0–149)
VLDL Cholesterol Cal: 13 mg/dL (ref 5–40)

## 2023-01-18 ENCOUNTER — Ambulatory Visit: Payer: Medicaid Other | Admitting: Physician Assistant

## 2023-01-18 ENCOUNTER — Other Ambulatory Visit: Payer: Self-pay

## 2023-01-18 ENCOUNTER — Encounter: Payer: Self-pay | Admitting: Physician Assistant

## 2023-01-18 DIAGNOSIS — M25511 Pain in right shoulder: Secondary | ICD-10-CM | POA: Diagnosis not present

## 2023-01-18 DIAGNOSIS — M542 Cervicalgia: Secondary | ICD-10-CM | POA: Diagnosis not present

## 2023-01-18 MED ORDER — LIDOCAINE HCL 1 % IJ SOLN
3.0000 mL | INTRAMUSCULAR | Status: AC | PRN
  Administered 2023-01-18: 3 mL

## 2023-01-18 MED ORDER — METHYLPREDNISOLONE ACETATE 40 MG/ML IJ SUSP
40.0000 mg | INTRAMUSCULAR | Status: AC | PRN
  Administered 2023-01-18: 40 mg via INTRA_ARTICULAR

## 2023-01-18 NOTE — Progress Notes (Signed)
Office Visit Note   Patient: Katherine Henson           Date of Birth: 07-Oct-1960           MRN: [id number] Visit Date: 01/18/2023              Requested by: Rema Fendt, NP [address] [place],  [place] [address] PCP: Rema Fendt, NP   Assessment & Plan: Visit Diagnoses:  1. Neck pain   2. Acute pain of right shoulder     Plan: Pleasant 62 year old woman left-hand-dominant with 3 to 4-day history of right shoulder pain she had no message of injury although she does work at [employer] and does a lot of reaching.  No popping she also is complaining of tingling down her right arm into her fingers with some right sided neck pain.  She was seen by her primary care provider who gave her some gabapentin which is helping somewhat.  Findings consistent with rotator cuff tendinitis and arthritis in her neck.  With regards to the neck she would if this was problematic enough get an MRI and consider an injection.  With regards to her shoulder I discussed trying a subacromial injection today as well as beginning therapy she is in agreement with this plan she will call me if she does not get improvement  Follow-Up Instructions: As needed  Orders:  Orders Placed This Encounter  Procedures  . XR Cervical Spine 2 or 3 views   No orders of the defined types were placed in this encounter.     Procedures: Large Joint Inj: R subacromial bursa on 01/18/2023 3:04 PM Indications: diagnostic evaluation and pain Details: 25 G 1.5 in needle, posterior approach  Arthrogram: No  Medications: 3 mL lidocaine 1 %; 40 mg methylPREDNISolone acetate 40 MG/ML Outcome: tolerated well, no immediate complications Procedure, treatment alternatives, risks and benefits explained, specific risks discussed. Consent was given by the patient.     Clinical Data: No additional findings.   Subjective: Chief Complaint  Patient presents with  . Right Shoulder - Pain    HPI Pleasant  62 year old left-hand-dominant woman with a chief complaint of right shoulder and neck pain.  Denies any injury.  She does have a job that requires quite a bit of reaching.  She started having trouble about 3 to 4 days ago.  Not taking any particular medication for Tylenol she cannot take anti-inflammatories because of her blood pressure Review of Systems  All other systems reviewed and are negative.    Objective: Vital Signs: LMP 09/27/2012   Physical Exam Constitutional:      Appearance: Normal appearance.  Pulmonary:     Effort: Pulmonary effort is normal.  Skin:    General: Skin is warm and dry.  Neurological:     General: No focal deficit present.     Mental Status: She is alert and oriented to person, place, and time.  Psychiatric:        Mood and Affect: Mood normal.        Behavior: Behavior normal.    Ortho Exam Neck she has no pain with forward flexion but does have reproduction of her symptoms with extension of her neck and turning away from her right shoulder grip strength is intact she does have brisk capillary refill. Examination of her right shoulder she has forward elevation to about 170 degrees but it is quite painful.  She resist doing internal rotation behind her back  because of pain abduction external and internal rotation strength is intact she has a positive empty can test positive impingement findings  Specialty Comments:  No specialty comments available.  Imaging: No results found.   PMFS History: Patient Active Problem List   Diagnosis Date Noted  . Pain in right shoulder 01/18/2023  . Abnormal findings on esophagogastroduodenoscopy (EGD) 07/22/2020  . Dysphagia 07/22/2020  . 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 Helicobacter pylori infection 07/22/2020  . Esophagitis determined by endoscopy 07/22/2020  . History of cerebellar stroke 11/26/2019  . Mixed hyperlipidemia 11/26/2019  . Positive double stranded DNA antibody test 04/10/2019  .  Chronic hepatitis C without hepatic coma (HCC) 12/06/2018  . HTN (hypertension) 04/30/2018   Past Medical History:  Diagnosis Date  . Allergy   . Arthritis   . Cataract    bilateral removed  . Chronic hepatitis C (HCC) 12/06/2018   treated for  . Depression   . GERD (gastroesophageal reflux disease)   . HTN (hypertension)   . Hyperlipidemia   . Pre-diabetes   . Stroke [facility])    mini stroke 2021    Family History  Problem Relation Age of Onset  . Stroke Mother   . Anuerysm Mother        brain  . Diabetes Sister   . Breast cancer Sister   . Breast cancer Sister   . Diabetes Brother   . Prostate cancer Brother   . Breast cancer Niece   . Breast cancer Niece   . Colon cancer Neg Hx   . Esophageal cancer Neg Hx   . Liver disease Neg Hx   . Stomach cancer Neg Hx   . Pancreatic cancer Neg Hx   . Inflammatory bowel disease Neg Hx   . Rectal cancer Neg Hx   . Colon polyps Neg Hx   . Crohn's disease Neg Hx   . Ulcerative colitis Neg Hx     Past Surgical History:  Procedure Laterality Date  . COLONOSCOPY    . ENDOSCOPIC MUCOSAL RESECTION  09/21/2020   Procedure: ENDOSCOPIC MUCOSAL RESECTION;  Surgeon: Meridee Score Netty Starring., MD;  Location: [facility];  Service: Gastroenterology;;  . ENDOSCOPIC MUCOSAL RESECTION N/A 10/11/2021   Procedure: ENDOSCOPIC MUCOSAL RESECTION;  Surgeon: Lemar Lofty., MD;  Location: [facility];  Service: Gastroenterology;  Laterality: N/A;  . ESOPHAGOGASTRODUODENOSCOPY (EGD) WITH PROPOFOL N/A 09/21/2020   Procedure: ESOPHAGOGASTRODUODENOSCOPY (EGD) WITH PROPOFOL;  Surgeon: Meridee Score Netty Starring., MD;  Location: [facility];  Service: Gastroenterology;  Laterality: N/A;  . ESOPHAGOGASTRODUODENOSCOPY (EGD) WITH PROPOFOL N/A 10/11/2021   Procedure: ESOPHAGOGASTRODUODENOSCOPY (EGD) WITH PROPOFOL;  Surgeon: Meridee Score Netty Starring., MD;  Location: [facility];  Service: Gastroenterology;  Laterality: N/A;  . HEMOSTASIS CLIP PLACEMENT   09/21/2020   Procedure: HEMOSTASIS CLIP PLACEMENT;  Surgeon: Lemar Lofty., MD;  Location: [facility];  Service: Gastroenterology;;  . HEMOSTASIS CLIP PLACEMENT  10/11/2021   Procedure: HEMOSTASIS CLIP PLACEMENT;  Surgeon: Lemar Lofty., MD;  Location: [facility];  Service: Gastroenterology;;  . NO PAST SURGERIES    . POLYPECTOMY  10/11/2021   Procedure: POLYPECTOMY;  Surgeon: Mansouraty, Netty Starring., MD;  Location: [facility];  Service: Gastroenterology;;  . Sunnie Nielsen LIFTING INJECTION  09/21/2020   Procedure: SUBMUCOSAL LIFTING INJECTION;  Surgeon: Lemar Lofty., MD;  Location: [facility];  Service: Gastroenterology;;  . Sunnie Nielsen LIFTING INJECTION  10/11/2021   Procedure: SUBMUCOSAL LIFTING INJECTION;  Surgeon: Lemar Lofty., MD;  Location: [facility];  Service: Gastroenterology;;  . Sunnie Nielsen TATTOO  INJECTION  10/11/2021   Procedure: SUBMUCOSAL TATTOO INJECTION;  Surgeon: Meridee Score Netty Starring., MD;  Location: [facility];  Service: Gastroenterology;;   Social History   Occupational History  . Occupation: housekeeper  Tobacco Use  . Smoking status: Former    Current packs/day: 0.00    Average packs/day: 0.5 packs/day for 30.0 years (15.0 ttl pk-yrs)    Types: Cigarettes    Start date: 12/05/1980    Quit date: 12/06/2010    Years since quitting: 12.1    Passive exposure: Past  . Smokeless tobacco: Never  . Tobacco comments:    Quit again 2021  Vaping Use  . Vaping status: Never Used  Substance and Sexual Activity  . Alcohol use: Yes    Alcohol/week: 1.0 standard drink of alcohol    Types: 1 Glasses of wine per week    Comment: occasional  . Drug use: Not Currently    Types: Marijuana  . Sexual activity: Not Currently

## 2023-01-19 ENCOUNTER — Encounter (HOSPITAL_COMMUNITY): Payer: Self-pay | Admitting: Gastroenterology

## 2023-01-19 NOTE — Progress Notes (Signed)
Attempted to obtain medical history for pre op call via telephone, unable to reach at this time. HIPAA compliant voicemail message left requesting return call to pre surgical testing department.

## 2023-01-25 NOTE — Anesthesia Preprocedure Evaluation (Signed)
Anesthesia Evaluation  Patient identified by MRN, date of birth, ID band Patient awake    Reviewed: Allergy & Precautions, NPO status , Patient's Chart, lab work & pertinent test results, reviewed documented beta blocker date and time   Airway Mallampati: II  TM Distance: >3 FB Neck ROM: Full    Dental no notable dental hx. (+) Missing, Poor Dentition, Dental Advisory Given, Chipped,    Pulmonary Patient abstained from smoking., former smoker   Pulmonary exam normal breath sounds clear to auscultation       Cardiovascular hypertension, Pt. on home beta blockers and Pt. on medications Normal cardiovascular exam Rhythm:Regular Rate:Normal     Neuro/Psych  PSYCHIATRIC DISORDERS  Depression    CVA, No Residual Symptoms    GI/Hepatic ,GERD  Controlled and Medicated,,(+) Hepatitis -, C  Endo/Other    Renal/GU      Musculoskeletal  (+) Arthritis ,    Abdominal   Peds  Hematology   Anesthesia Other Findings All: PCN  Reproductive/Obstetrics                             Anesthesia Physical Anesthesia Plan  ASA: 3  Anesthesia Plan: MAC   Post-op Pain Management: Minimal or no pain anticipated   Induction: Intravenous  PONV Risk Score and Plan: 2 and Treatment may vary due to age or medical condition, Propofol infusion and TIVA  Airway Management Planned: Nasal Cannula, Natural Airway and Simple Face Mask  Additional Equipment: None  Intra-op Plan:   Post-operative Plan:   Informed Consent: I have reviewed the patients History and Physical, chart, labs and discussed the procedure including the risks, benefits and alternatives for the proposed anesthesia with the patient or authorized representative who has indicated his/her understanding and acceptance.     Dental advisory given  Plan Discussed with: CRNA  Anesthesia Plan Comments: (EGD for Gastric polyp)        Anesthesia  Quick Evaluation

## 2023-01-26 ENCOUNTER — Ambulatory Visit (HOSPITAL_BASED_OUTPATIENT_CLINIC_OR_DEPARTMENT_OTHER): Payer: Medicaid Other | Admitting: Anesthesiology

## 2023-01-26 ENCOUNTER — Ambulatory Visit (HOSPITAL_COMMUNITY): Payer: Self-pay | Admitting: Anesthesiology

## 2023-01-26 ENCOUNTER — Encounter (HOSPITAL_COMMUNITY)
Admission: RE | Disposition: A | Discharge status: Home / Self Care | Payer: Self-pay | Source: Home / Self Care | Attending: Gastroenterology

## 2023-01-26 ENCOUNTER — Other Ambulatory Visit: Payer: Self-pay

## 2023-01-26 ENCOUNTER — Encounter (HOSPITAL_COMMUNITY): Payer: Self-pay | Admitting: Gastroenterology

## 2023-01-26 ENCOUNTER — Ambulatory Visit (HOSPITAL_COMMUNITY)
Admission: RE | Admit: 2023-01-26 | Discharge: 2023-01-26 | Disposition: A | Discharge status: Home / Self Care | Payer: Medicaid Other | Attending: Gastroenterology | Admitting: Gastroenterology

## 2023-01-26 DIAGNOSIS — Z09 Encounter for follow-up examination after completed treatment for conditions other than malignant neoplasm: Secondary | ICD-10-CM | POA: Diagnosis not present

## 2023-01-26 DIAGNOSIS — B182 Chronic viral hepatitis C: Secondary | ICD-10-CM | POA: Insufficient documentation

## 2023-01-26 DIAGNOSIS — K2950 Unspecified chronic gastritis without bleeding: Secondary | ICD-10-CM | POA: Insufficient documentation

## 2023-01-26 DIAGNOSIS — K317 Polyp of stomach and duodenum: Secondary | ICD-10-CM

## 2023-01-26 DIAGNOSIS — I10 Essential (primary) hypertension: Secondary | ICD-10-CM | POA: Insufficient documentation

## 2023-01-26 DIAGNOSIS — Z87891 Personal history of nicotine dependence: Secondary | ICD-10-CM | POA: Insufficient documentation

## 2023-01-26 DIAGNOSIS — K2980 Duodenitis without bleeding: Secondary | ICD-10-CM | POA: Diagnosis not present

## 2023-01-26 DIAGNOSIS — K2970 Gastritis, unspecified, without bleeding: Secondary | ICD-10-CM

## 2023-01-26 DIAGNOSIS — Z8673 Personal history of transient ischemic attack (TIA), and cerebral infarction without residual deficits: Secondary | ICD-10-CM | POA: Insufficient documentation

## 2023-01-26 DIAGNOSIS — K219 Gastro-esophageal reflux disease without esophagitis: Secondary | ICD-10-CM | POA: Insufficient documentation

## 2023-01-26 DIAGNOSIS — K449 Diaphragmatic hernia without obstruction or gangrene: Secondary | ICD-10-CM | POA: Insufficient documentation

## 2023-01-26 DIAGNOSIS — K3189 Other diseases of stomach and duodenum: Secondary | ICD-10-CM

## 2023-01-26 DIAGNOSIS — D132 Benign neoplasm of duodenum: Secondary | ICD-10-CM

## 2023-01-26 DIAGNOSIS — K2990 Gastroduodenitis, unspecified, without bleeding: Secondary | ICD-10-CM | POA: Diagnosis not present

## 2023-01-26 HISTORY — PX: BIOPSY: SHX5522

## 2023-01-26 HISTORY — PX: ESOPHAGOGASTRODUODENOSCOPY (EGD) WITH PROPOFOL: SHX5813

## 2023-01-26 SURGERY — ESOPHAGOGASTRODUODENOSCOPY (EGD) WITH PROPOFOL
Anesthesia: Monitor Anesthesia Care

## 2023-01-26 MED ORDER — PROPOFOL 10 MG/ML IV BOLUS
INTRAVENOUS | Status: DC | PRN
  Administered 2023-01-26: 30 mg via INTRAVENOUS
  Administered 2023-01-26: 20 mg via INTRAVENOUS

## 2023-01-26 MED ORDER — SODIUM CHLORIDE 0.9 % IV SOLN: INTRAVENOUS | Status: DC | PRN

## 2023-01-26 MED ORDER — SODIUM CHLORIDE 0.9 % IV SOLN: INTRAVENOUS | Status: DC

## 2023-01-26 MED ORDER — PROPOFOL 500 MG/50ML IV EMUL
INTRAVENOUS | Status: DC | PRN
  Administered 2023-01-26: 75 ug/kg/min via INTRAVENOUS

## 2023-01-26 SURGICAL SUPPLY — 14 items

## 2023-01-26 NOTE — Anesthesia Postprocedure Evaluation (Signed)
Anesthesia Post Note  Patient: Katherine Henson  Procedure(s) Performed: ESOPHAGOGASTRODUODENOSCOPY (EGD) WITH PROPOFOL BIOPSY     Patient location during evaluation: PACU Anesthesia Type: MAC Level of consciousness: awake and alert Pain management: pain level controlled Vital Signs Assessment: post-procedure vital signs reviewed and stable Respiratory status: spontaneous breathing Cardiovascular status: stable Anesthetic complications: no   No notable events documented.  Last Vitals:  Vitals:   01/26/23 1041 01/26/23 1050  BP: 101/65 (!) 130/59  Pulse: (!) 58 (!) 57  Resp: 17 18  Temp:    SpO2: 100% 96%    Last Pain:  Vitals:   01/26/23 1050  TempSrc:   PainSc: 0-No pain                 Lewie Loron

## 2023-01-26 NOTE — Op Note (Signed)
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 Estimated Blood Loss:     Estimated blood loss was minimal. Procedure:                Pre-Anesthesia Assessment:                           - Prior to the procedure, a History and Physical                            was performed, and patient medications and                            allergies were reviewed. The patient's tolerance of                            previous anesthesia was also reviewed. The risks                            and benefits of the procedure and the sedation                            options and risks were discussed with the patient.                            All questions were answered, and informed consent                            was obtained. Prior Anticoagulants: The patient has                            taken no anticoagulant or antiplatelet agents  except for aspirin. ASA Grade Assessment: III - A                            patient with severe systemic disease. After                            reviewing the risks and benefits, the patient was                            deemed in satisfactory condition to undergo the                            procedure.                           After obtaining informed consent, the endoscope  was                            passed under direct vision. Throughout the                            procedure, the patient's blood pressure, pulse, and                            oxygen saturations were monitored continuously. The                            [id number]-[id number] ([id number]) Olympus therapeutic endoscope                            was introduced through the mouth, and advanced to                            the second part of duodenum. The upper GI endoscopy                            was accomplished without difficulty. The patient                            tolerated the procedure. Scope In: Scope Out: Findings:      No gross lesions were noted in the entire esophagus.      The Z-line was regular and was found 35 cm from the incisors.      A 1 cm hiatal hernia was present.      Patchy mildly erythematous mucosa without bleeding was found in the       gastric antrum.      No other gross lesions were noted in the entire examined stomach.       Biopsies were taken with a cold forceps for histology and Helicobacter       pylori testing.      A medium post mucosectomy scar was found in the second portion of the       duodenum with tattoo noted proximal. There was still attached a single       Hemoclip. At the area where the Hemoclip was  placed, there appeared to       be some polypoid tissue. This Hemoclip was removed and the tissue       underneath was removed with a cold snare for histology purposes to see       if this was granulation versus persistent adenoma.      No other gross lesions were noted in the duodenal bulb, in the first       portion of the duodenum and in the second portion of the duodenum. Impression:               - No gross lesions in the entire esophagus. Z-line                            regular, 35 cm from the incisors.                           - 1 cm hiatal hernia.                           - Erythematous mucosa in the antrum. No other gross                             lesions in the entire stomach. Biopsied.                           - Duodenal scar with evidence of Hemoclip and                            polypoid tissue underneath the clip. Hemoclip was                            removed and the area was sampled to rule out                            persistent/recurrent adenoma.                           - No gross lesions in the duodenal bulb, in the                            first portion of the duodenum and in the second                            portion of the duodenum. Moderate Sedation:      Not Applicable - Patient had care per Anesthesia. Recommendation:           - The patient will be observed post-procedure,                            until all discharge criteria are met.                           - Discharge patient to home.                           -  Patient has a contact number available for                            emergencies. The signs and symptoms of potential                            delayed complications were discussed with the                            patient. Return to normal activities tomorrow.                            Written discharge instructions were provided to the                            patient.                           - Resume previous diet.                           - No ibuprofen, naproxen, or other non-steroidal                            anti-inflammatory drugs for 1 week.                           - Continue present medications otherwise.                           - Await pathology results.                           - Repeat upper endoscopy in 1 year for surveillance                            based on pathology results if adenoma is found. If                            no evidence of adenoma then consider repeat upper                            endoscopy in 1 to 2 years.                           - The findings and recommendations were discussed                            with the  patient.                           - The findings and recommendations were discussed                            with the patient's family. Procedure Code(s):        ---  Professional ---                           [phone], Esophagogastroduodenoscopy, flexible,                            transoral; with removal of tumor(s), polyp(s), or                            other lesion(s) by snare technique                           43239, 59, Esophagogastroduodenoscopy, flexible,                            transoral; with biopsy, single or multiple Diagnosis Code(s):        --- Professional ---                           K44.9, Diaphragmatic hernia without obstruction or                            gangrene                           K31.89, Other diseases of stomach and duodenum                           K31.7, Polyp of stomach and duodenum CPT copyright 2022 American [employer]. All rights reserved. The codes documented in this report are preliminary and upon coder review may  be revised to meet current compliance requirements. Corliss Parish, MD 01/26/2023 10:44:37 AM Number of Addenda: 0

## 2023-01-26 NOTE — Discharge Instructions (Signed)

## 2023-01-26 NOTE — Addendum Note (Signed)
Addendum  created 01/26/23 1419 by Deri Fuelling, CRNA   Flowsheet accepted

## 2023-01-26 NOTE — Transfer of Care (Signed)
Immediate Anesthesia Transfer of Care Note  Patient: Katherine Henson  Procedure(s) Performed: ESOPHAGOGASTRODUODENOSCOPY (EGD) WITH PROPOFOL BIOPSY  Patient Location: PACU and Endoscopy Unit  Anesthesia Type:MAC  Level of Consciousness: awake and alert   Airway & Oxygen Therapy: Patient Spontanous Breathing and Patient connected to nasal cannula oxygen  Post-op Assessment: Report given to RN and Post -op Vital signs reviewed and stable  Post vital signs: Reviewed and stable  Last Vitals:  Vitals Value Taken Time  BP 98/57 01/26/23 1037  Temp    Pulse 54 01/26/23 1039  Resp 16 01/26/23 1039  SpO2 100 % 01/26/23 1039  Vitals shown include unfiled device data.  Last Pain:  Vitals:   01/26/23 0925  TempSrc: Temporal  PainSc: 0-No pain         Complications: No notable events documented.

## 2023-01-26 NOTE — H&P (Signed)
GASTROENTEROLOGY PROCEDURE H&P NOTE   Primary Care Physician: Rema Fendt, NP  HPI: Katherine Henson is a 62 y.o. female who presents for EGD for followup of Duodenal Adenoma with recurrence in 2023.  Past Medical History:  Diagnosis Date   Allergy    Arthritis    Cataract    bilateral removed   Chronic hepatitis C (HCC) 12/06/2018   treated for   Depression    GERD (gastroesophageal reflux disease)    HTN (hypertension)    Hyperlipidemia    Pre-diabetes    Stroke [facility])    mini stroke 2021   Past Surgical History:  Procedure Laterality Date   COLONOSCOPY     ENDOSCOPIC MUCOSAL RESECTION  09/21/2020   Procedure: ENDOSCOPIC MUCOSAL RESECTION;  Surgeon: Lemar Lofty., MD;  Location: [facility];  Service: Gastroenterology;;   ENDOSCOPIC MUCOSAL RESECTION N/A 10/11/2021   Procedure: ENDOSCOPIC MUCOSAL RESECTION;  Surgeon: Lemar Lofty., MD;  Location: [facility];  Service: Gastroenterology;  Laterality: N/A;   ESOPHAGOGASTRODUODENOSCOPY (EGD) WITH PROPOFOL N/A 09/21/2020   Procedure: ESOPHAGOGASTRODUODENOSCOPY (EGD) WITH PROPOFOL;  Surgeon: Meridee Score Netty Starring., MD;  Location: [facility];  Service: Gastroenterology;  Laterality: N/A;   ESOPHAGOGASTRODUODENOSCOPY (EGD) WITH PROPOFOL N/A 10/11/2021   Procedure: ESOPHAGOGASTRODUODENOSCOPY (EGD) WITH PROPOFOL;  Surgeon: Meridee Score Netty Starring., MD;  Location: [facility];  Service: Gastroenterology;  Laterality: N/A;   HEMOSTASIS CLIP PLACEMENT  09/21/2020   Procedure: HEMOSTASIS CLIP PLACEMENT;  Surgeon: Lemar Lofty., MD;  Location: [facility];  Service: Gastroenterology;;   HEMOSTASIS CLIP PLACEMENT  10/11/2021   Procedure: HEMOSTASIS CLIP PLACEMENT;  Surgeon: Lemar Lofty., MD;  Location: [facility];  Service: Gastroenterology;;   NO PAST SURGERIES     POLYPECTOMY  10/11/2021   Procedure: POLYPECTOMY;  Surgeon: Lemar Lofty., MD;  Location: [facility];   Service: Gastroenterology;;   SUBMUCOSAL LIFTING INJECTION  09/21/2020   Procedure: SUBMUCOSAL LIFTING INJECTION;  Surgeon: Lemar Lofty., MD;  Location: [facility];  Service: Gastroenterology;;   SUBMUCOSAL LIFTING INJECTION  10/11/2021   Procedure: SUBMUCOSAL LIFTING INJECTION;  Surgeon: Lemar Lofty., MD;  Location: [facility];  Service: Gastroenterology;;   SUBMUCOSAL TATTOO INJECTION  10/11/2021   Procedure: SUBMUCOSAL TATTOO INJECTION;  Surgeon: Lemar Lofty., MD;  Location: [facility];  Service: Gastroenterology;;   Current Facility-Administered Medications  Medication Dose Route Frequency Provider Last Rate Last Admin   0.9 %  sodium chloride infusion   Intravenous Continuous Mansouraty, Netty Starring., MD        Current Facility-Administered Medications:    0.9 %  sodium chloride infusion, , Intravenous, Continuous, Mansouraty, Netty Starring., MD Allergies  Allergen Reactions   Penicillins Anaphylaxis    Did it involve swelling of the face/tongue/throat, SOB, or low BP? No Did it involve sudden or severe rash/hives, skin peeling, or any reaction on the inside of your mouth or nose? Yes Did you need to seek medical attention at a hospital or doctor's office? No When did it last happen?  30 years ago     If all above answers are "NO", may proceed with cephalosporin use.   Family History  Problem Relation Age of Onset   Stroke Mother    Anuerysm Mother        brain   Diabetes Sister    Breast cancer Sister    Breast cancer Sister    Diabetes Brother    Prostate cancer Brother    Breast cancer Niece    Breast cancer  Niece    Colon cancer Neg Hx    Esophageal cancer Neg Hx    Liver disease Neg Hx    Stomach cancer Neg Hx    Pancreatic cancer Neg Hx    Inflammatory bowel disease Neg Hx    Rectal cancer Neg Hx    Colon polyps Neg Hx    Crohn's disease Neg Hx    Ulcerative colitis Neg Hx    Social History   Socioeconomic History    Marital status: Single    Spouse name: Not on file   Number of children: 5   Years of education: Not on file   Highest education level: Not on file  Occupational History   Occupation: housekeeper  Tobacco Use   Smoking status: Former    Current packs/day: 0.00    Average packs/day: 0.5 packs/day for 30.0 years (15.0 ttl pk-yrs)    Types: Cigarettes    Start date: 12/05/1980    Quit date: 12/06/2010    Years since quitting: 12.1    Passive exposure: Past   Smokeless tobacco: Never   Tobacco comments:    Quit again 2021  Vaping Use   Vaping status: Never Used  Substance and Sexual Activity   Alcohol use: Yes    Alcohol/week: 1.0 standard drink of alcohol    Types: 1 Glasses of wine per week    Comment: occasional   Drug use: Not Currently    Types: Marijuana   Sexual activity: Not Currently  Other Topics Concern   Not on file  Social History Narrative   Left handed   One story home   No caffeine   Social Determinants of Health   Financial Resource Strain: Not on file  Food Insecurity: Food Insecurity Present (02/03/2021)   Hunger Vital Sign    Worried About Running Out of Food in the Last Year: Often true    Ran Out of Food in the Last Year: Sometimes true  Transportation Needs: Unmet Transportation Needs (02/03/2021)   PRAPARE - Administrator, Civil Service (Medical): Yes    Lack of Transportation (Non-Medical): Yes  Physical Activity: Not on file  Stress: Not on file  Social Connections: Not on file  Intimate Partner Violence: Not on file    Physical Exam: Today's Vitals   01/26/23 0925  BP: 126/72  Pulse: 63  Resp: 17  Temp: (!) 97.4 F (36.3 C)  TempSrc: Temporal  SpO2: 100%  Weight: 66.2 kg  Height: 5' (1.524 m)  PainSc: 0-No pain   Body mass index is 28.51 kg/m. GEN: NAD EYE: Sclerae anicteric ENT: MMM CV: Non-tachycardic GI: Soft, NT/ND NEURO:  Alert & Oriented x 3  Lab Results: No results for input(s): "WBC", "HGB", "HCT",  "PLT" in the last 72 hours. BMET No results for input(s): "NA", "K", "CL", "CO2", "GLUCOSE", "BUN", "CREATININE", "CALCIUM" in the last 72 hours. LFT No results for input(s): "PROT", "ALBUMIN", "AST", "ALT", "ALKPHOS", "BILITOT", "BILIDIR", "IBILI" in the last 72 hours. PT/INR No results for input(s): "LABPROT", "INR" in the last 72 hours.   Impression / Plan: This is a 62 y.o.female who presents for EGD for followup of Duodenal Adenoma with recurrence in 2023.  The risks and benefits of endoscopic evaluation/treatment were discussed with the patient and/or family; these include but are not limited to the risk of perforation, infection, bleeding, missed lesions, lack of diagnosis, severe illness requiring hospitalization, as well as anesthesia and sedation related illnesses.  The patient's history has been  reviewed, patient examined, no change in status, and deemed stable for procedure.  The patient and/or family is agreeable to proceed.    Corliss Parish, MD [facility] Office # [phone]

## 2023-01-29 ENCOUNTER — Encounter (HOSPITAL_COMMUNITY): Payer: Self-pay | Admitting: Gastroenterology

## 2023-01-31 ENCOUNTER — Encounter: Payer: Self-pay | Admitting: Gastroenterology

## 2023-01-31 ENCOUNTER — Other Ambulatory Visit: Payer: Self-pay

## 2023-01-31 ENCOUNTER — Telehealth: Payer: Self-pay | Admitting: Gastroenterology

## 2023-01-31 LAB — SURGICAL PATHOLOGY

## 2023-01-31 MED ORDER — BISMUTH/METRONIDAZ/TETRACYCLIN 140-125-125 MG PO CAPS: 3.0000 | ORAL_CAPSULE | Freq: Four times a day (QID) | ORAL | 0 refills | Status: DC

## 2023-01-31 NOTE — Telephone Encounter (Signed)
Inbound call from patient returning phone call regarding recent lab results. Requesting a call back. Please advise, thank you.

## 2023-01-31 NOTE — Telephone Encounter (Signed)
See results note dated 11/26

## 2023-02-09 NOTE — Therapy (Incomplete)
OUTPATIENT PHYSICAL THERAPY SHOULDER EVALUATION   Patient Name: Katherine Henson MRN: [id number] DOB:September 21, 1960, 62 y.o., female Today's Date: 02/09/2023  END OF SESSION:   Past Medical History:  Diagnosis Date   Allergy    Arthritis    Cataract    bilateral removed   Chronic hepatitis C (HCC) 12/06/2018   treated for   Depression    GERD (gastroesophageal reflux disease)    HTN (hypertension)    Hyperlipidemia    Pre-diabetes    Stroke [facility])    mini stroke 2021   Past Surgical History:  Procedure Laterality Date   BIOPSY  01/26/2023   Procedure: BIOPSY;  Surgeon: Lemar Lofty., MD;  Location: [facility];  Service: Gastroenterology;;   COLONOSCOPY     ENDOSCOPIC MUCOSAL RESECTION  09/21/2020   Procedure: ENDOSCOPIC MUCOSAL RESECTION;  Surgeon: Lemar Lofty., MD;  Location: [facility];  Service: Gastroenterology;;   ENDOSCOPIC MUCOSAL RESECTION N/A 10/11/2021   Procedure: ENDOSCOPIC MUCOSAL RESECTION;  Surgeon: Lemar Lofty., MD;  Location: [facility];  Service: Gastroenterology;  Laterality: N/A;   ESOPHAGOGASTRODUODENOSCOPY (EGD) WITH PROPOFOL N/A 09/21/2020   Procedure: ESOPHAGOGASTRODUODENOSCOPY (EGD) WITH PROPOFOL;  Surgeon: Meridee Score Netty Starring., MD;  Location: [facility];  Service: Gastroenterology;  Laterality: N/A;   ESOPHAGOGASTRODUODENOSCOPY (EGD) WITH PROPOFOL N/A 10/11/2021   Procedure: ESOPHAGOGASTRODUODENOSCOPY (EGD) WITH PROPOFOL;  Surgeon: Meridee Score Netty Starring., MD;  Location: [facility];  Service: Gastroenterology;  Laterality: N/A;   ESOPHAGOGASTRODUODENOSCOPY (EGD) WITH PROPOFOL N/A 01/26/2023   Procedure: ESOPHAGOGASTRODUODENOSCOPY (EGD) WITH PROPOFOL;  Surgeon: Meridee Score Netty Starring., MD;  Location: [facility];  Service: Gastroenterology;  Laterality: N/A;   HEMOSTASIS CLIP PLACEMENT  09/21/2020   Procedure: HEMOSTASIS CLIP PLACEMENT;  Surgeon: Lemar Lofty., MD;  Location: [facility];  Service:  Gastroenterology;;   HEMOSTASIS CLIP PLACEMENT  10/11/2021   Procedure: HEMOSTASIS CLIP PLACEMENT;  Surgeon: Lemar Lofty., MD;  Location: [facility];  Service: Gastroenterology;;   NO PAST SURGERIES     POLYPECTOMY  10/11/2021   Procedure: POLYPECTOMY;  Surgeon: Lemar Lofty., MD;  Location: [facility];  Service: Gastroenterology;;   SUBMUCOSAL LIFTING INJECTION  09/21/2020   Procedure: SUBMUCOSAL LIFTING INJECTION;  Surgeon: Lemar Lofty., MD;  Location: [facility];  Service: Gastroenterology;;   Sunnie Nielsen LIFTING INJECTION  10/11/2021   Procedure: SUBMUCOSAL LIFTING INJECTION;  Surgeon: Lemar Lofty., MD;  Location: [facility];  Service: Gastroenterology;;   SUBMUCOSAL TATTOO INJECTION  10/11/2021   Procedure: SUBMUCOSAL TATTOO INJECTION;  Surgeon: Lemar Lofty., MD;  Location: [facility];  Service: Gastroenterology;;   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  Rationale for Evaluation and Treatment: Rehabilitation  ONSET DATE: ***  SUBJECTIVE:  SUBJECTIVE STATEMENT: *** Hand dominance: {MISC; OT HAND DOMINANCE:[phone]}  PERTINENT HISTORY: ***  PAIN:  Are you having pain? Yes: NPRS scale: ***/10 Pain location: *** Pain description: *** Aggravating factors: *** Relieving factors: ***  PRECAUTIONS: {Therapy precautions:24002}  RED FLAGS: {PT Red Flags:29287}   WEIGHT BEARING RESTRICTIONS: {Yes ***/No:24003}  FALLS:  Has patient fallen in last 6 months? {fallsyesno:27318}  LIVING ENVIRONMENT: Lives with: {[facility] lives with:25569::"lives with their family"} Lives in: {Lives in:25570} Stairs: {opstairs:27293} Has following equipment at home: {Assistive devices:23999}  OCCUPATION: ***  PLOF: {PLOF:24004}  PATIENT GOALS:***  NEXT MD VISIT:   OBJECTIVE:  Note: Objective measures were completed at Evaluation unless otherwise noted.  DIAGNOSTIC FINDINGS:  ***  PATIENT SURVEYS:  {rehab surveys:24030:a}  COGNITION: Overall cognitive status: {cognition:24006}     SENSATION: {sensation:27233}  POSTURE: ***  UPPER EXTREMITY ROM:   {AROM/PROM:27142} ROM Right eval Left eval  Shoulder flexion    Shoulder extension    Shoulder abduction    Shoulder adduction    Shoulder internal rotation    Shoulder external rotation    Elbow flexion    Elbow extension    Wrist flexion    Wrist extension    Wrist ulnar deviation    Wrist radial deviation    Wrist pronation    Wrist supination    (Blank rows = not tested)  UPPER EXTREMITY MMT:  MMT Right eval Left eval  Shoulder flexion    Shoulder extension    Shoulder abduction    Shoulder adduction    Shoulder internal rotation    Shoulder external rotation    Middle trapezius    Lower trapezius    Elbow flexion    Elbow extension    Wrist flexion    Wrist extension    Wrist ulnar deviation    Wrist radial deviation    Wrist pronation    Wrist supination    Grip strength (lbs)    (Blank rows =  not tested)  SHOULDER SPECIAL TESTS: Impingement tests: {shoulder impingement test:25231:a} SLAP lesions: {SLAP lesions:25232} Instability tests: {shoulder instability test:25233} Rotator cuff assessment: {rotator cuff assessment:25234} Biceps assessment: {biceps assessment:25235}  JOINT MOBILITY TESTING:  ***  PALPATION:  ***   TODAY'S TREATMENT:                                                                                                                                         DATE: ***   PATIENT EDUCATION: Education details: *** Person educated: {Person educated:25204} Education method: {Education Method:25205} Education comprehension: {Education Comprehension:25206}  HOME EXERCISE PROGRAM: ***  ASSESSMENT:  CLINICAL IMPRESSION: Patient is a *** y.o. *** who was seen today for physical therapy evaluation and treatment for ***.   OBJECTIVE IMPAIRMENTS: {opptimpairments:25111}.   ACTIVITY LIMITATIONS: {activitylimitations:27494}  PARTICIPATION LIMITATIONS: {participationrestrictions:25113}  PERSONAL FACTORS: {Personal factors:25162} are also affecting patient's functional outcome.   REHAB POTENTIAL: {rehabpotential:25112}  CLINICAL DECISION  MAKING: {clinical decision making:25114}  EVALUATION COMPLEXITY: {Evaluation complexity:25115}   GOALS: Goals reviewed with patient? {yes/no:20286}  SHORT TERM GOALS: Target date: ***  *** Baseline: Goal status: INITIAL  2.  *** Baseline:  Goal status: INITIAL  3.  *** Baseline:  Goal status: INITIAL  4.  *** Baseline:  Goal status: INITIAL  5.  *** Baseline:  Goal status: INITIAL  6.  *** Baseline:  Goal status: INITIAL  LONG TERM GOALS: Target date: ***  *** Baseline:  Goal status: INITIAL  2.  *** Baseline:  Goal status: INITIAL  3.  *** Baseline:  Goal status: INITIAL  4.  *** Baseline:  Goal status: INITIAL  5.  *** Baseline:  Goal status: INITIAL  6.  *** Baseline:  Goal  status: INITIAL  PLAN:  PT FREQUENCY: {rehab frequency:25116}  PT DURATION: {rehab duration:25117}  PLANNED INTERVENTIONS: {rehab planned interventions:25118::"97110-Therapeutic exercises","97530- Therapeutic [phone]- Neuromuscular re-education","97535- Self WUXL","24401- Manual therapy"}  PLAN FOR NEXT SESSION: Jenelle Mages, PT 02/09/2023, 1:29 PM

## 2023-02-14 ENCOUNTER — Ambulatory Visit: Payer: Medicaid Other | Admitting: Physical Therapy

## 2023-02-21 NOTE — Therapy (Deleted)
OUTPATIENT PHYSICAL THERAPY SHOULDER EVALUATION   Patient Name: Katherine Henson MRN: [id number] DOB:1960/05/10, 62 y.o., female Today's Date: 02/21/2023  END OF SESSION:   Past Medical History:  Diagnosis Date   Allergy    Arthritis    Cataract    bilateral removed   Chronic hepatitis C (HCC) 12/06/2018   treated for   Depression    GERD (gastroesophageal reflux disease)    HTN (hypertension)    Hyperlipidemia    Pre-diabetes    Stroke [facility])    mini stroke 2021   Past Surgical History:  Procedure Laterality Date   BIOPSY  01/26/2023   Procedure: BIOPSY;  Surgeon: Lemar Lofty., MD;  Location: [facility];  Service: Gastroenterology;;   COLONOSCOPY     ENDOSCOPIC MUCOSAL RESECTION  09/21/2020   Procedure: ENDOSCOPIC MUCOSAL RESECTION;  Surgeon: Lemar Lofty., MD;  Location: [facility];  Service: Gastroenterology;;   ENDOSCOPIC MUCOSAL RESECTION N/A 10/11/2021   Procedure: ENDOSCOPIC MUCOSAL RESECTION;  Surgeon: Lemar Lofty., MD;  Location: [facility];  Service: Gastroenterology;  Laterality: N/A;   ESOPHAGOGASTRODUODENOSCOPY (EGD) WITH PROPOFOL N/A 09/21/2020   Procedure: ESOPHAGOGASTRODUODENOSCOPY (EGD) WITH PROPOFOL;  Surgeon: Meridee Score Netty Starring., MD;  Location: [facility];  Service: Gastroenterology;  Laterality: N/A;   ESOPHAGOGASTRODUODENOSCOPY (EGD) WITH PROPOFOL N/A 10/11/2021   Procedure: ESOPHAGOGASTRODUODENOSCOPY (EGD) WITH PROPOFOL;  Surgeon: Meridee Score Netty Starring., MD;  Location: [facility];  Service: Gastroenterology;  Laterality: N/A;   ESOPHAGOGASTRODUODENOSCOPY (EGD) WITH PROPOFOL N/A 01/26/2023   Procedure: ESOPHAGOGASTRODUODENOSCOPY (EGD) WITH PROPOFOL;  Surgeon: Meridee Score Netty Starring., MD;  Location: [facility];  Service: Gastroenterology;  Laterality: N/A;   HEMOSTASIS CLIP PLACEMENT  09/21/2020   Procedure: HEMOSTASIS CLIP PLACEMENT;  Surgeon: Lemar Lofty., MD;  Location: [facility];  Service:  Gastroenterology;;   HEMOSTASIS CLIP PLACEMENT  10/11/2021   Procedure: HEMOSTASIS CLIP PLACEMENT;  Surgeon: Lemar Lofty., MD;  Location: [facility];  Service: Gastroenterology;;   NO PAST SURGERIES     POLYPECTOMY  10/11/2021   Procedure: POLYPECTOMY;  Surgeon: Lemar Lofty., MD;  Location: [facility];  Service: Gastroenterology;;   SUBMUCOSAL LIFTING INJECTION  09/21/2020   Procedure: SUBMUCOSAL LIFTING INJECTION;  Surgeon: Lemar Lofty., MD;  Location: [facility];  Service: Gastroenterology;;   Sunnie Nielsen LIFTING INJECTION  10/11/2021   Procedure: SUBMUCOSAL LIFTING INJECTION;  Surgeon: Lemar Lofty., MD;  Location: [facility];  Service: Gastroenterology;;   SUBMUCOSAL TATTOO INJECTION  10/11/2021   Procedure: SUBMUCOSAL TATTOO INJECTION;  Surgeon: Lemar Lofty., MD;  Location: [facility];  Service: Gastroenterology;;   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  Rationale for Evaluation and Treatment: Rehabilitation  ONSET DATE: ***  SUBJECTIVE:  SUBJECTIVE STATEMENT: *** Hand dominance: {MISC; OT HAND DOMINANCE:[phone]}  PERTINENT HISTORY: Neck she has no pain with forward flexion but does have reproduction of her symptoms with extension of her neck and turning away from her right shoulder grip strength is intact she does have brisk capillary refill. Examination of her right shoulder she has forward elevation to about 170 degrees but it is quite painful.  She resist doing internal rotation behind her back because of pain abduction external and internal rotation strength is intact she has a positive empty can test positive impingement findings  PAIN:  Are you having pain? {OPRCPAIN:27236}  PRECAUTIONS: None  RED FLAGS: None   WEIGHT BEARING RESTRICTIONS: No  FALLS:  Has patient fallen in last 6 months? No  OCCUPATION: ***  PLOF: Independent  PATIENT GOALS:To manage my shoulder pain  NEXT MD VISIT:   OBJECTIVE:  Note: Objective measures were completed at Evaluation unless otherwise noted.  DIAGNOSTIC FINDINGS:  CLINICAL DATA:  Pain.   EXAM: RIGHT SHOULDER - 3 VIEW   COMPARISON:  None Available.   FINDINGS: There is no evidence of fracture or dislocation. Acromioclavicular degenerative change with narrowing and osteophyte formation.   IMPRESSION: Acromioclavicular degenerative changes. No acute osseous abnormalities.     Electronically Signed   By: Layla Maw M.D.   On: 02/02/2023 01:42  PATIENT SURVEYS:  FOTO ***  POSTURE: ***  UPPER EXTREMITY ROM:   {AROM/PROM:27142} ROM Right eval Left eval  Shoulder flexion    Shoulder extension    Shoulder abduction    Shoulder adduction    Shoulder internal rotation    Shoulder external rotation    Elbow flexion    Elbow extension    Wrist flexion    Wrist extension    Wrist ulnar deviation    Wrist radial deviation    Wrist pronation    Wrist supination    (Blank rows = not tested)  UPPER  EXTREMITY MMT:  MMT Right eval Left eval  Shoulder flexion    Shoulder extension    Shoulder abduction    Shoulder adduction    Shoulder internal rotation    Shoulder external rotation    Middle trapezius    Lower trapezius    Elbow flexion    Elbow extension    Wrist flexion    Wrist extension    Wrist ulnar deviation    Wrist radial deviation    Wrist pronation    Wrist supination    Grip strength (lbs)    (Blank rows = not tested)  SHOULDER SPECIAL TESTS: Impingement tests: {shoulder impingement test:25231:a} SLAP lesions: {SLAP lesions:25232} Instability tests: {shoulder instability test:25233} Rotator cuff assessment: {rotator cuff assessment:25234} Biceps assessment: {biceps assessment:25235}  JOINT MOBILITY TESTING:  ***  PALPATION:  ***   TODAY'S TREATMENT:  DATE: ***   PATIENT EDUCATION: Education details: Discussed eval findings, rehab rationale and POC and patient is in agreement  Person educated: Patient Education method: Explanation Education comprehension: verbalized understanding and needs further education  HOME EXERCISE PROGRAM: ***  ASSESSMENT:  CLINICAL IMPRESSION: Patient is a *** y.o. *** who was seen today for physical therapy evaluation and treatment for ***.   OBJECTIVE IMPAIRMENTS: {opptimpairments:25111}.   ACTIVITY LIMITATIONS: {activitylimitations:27494}  PERSONAL FACTORS: {Personal factors:25162} are also affecting patient's functional outcome.   REHAB POTENTIAL: Good  CLINICAL DECISION MAKING: Stable/uncomplicated  EVALUATION COMPLEXITY: Low   GOALS: Goals reviewed with patient? Yes  SHORT TERM GOALS: Target date: 03/14/2023    Patient to demonstrate independence in HEP  Baseline: Goal status: INITIAL  2.  *** Baseline:  Goal status: INITIAL  3.  *** Baseline:  Goal status:  INITIAL  4.  *** Baseline:  Goal status: INITIAL  5.  *** Baseline:  Goal status: INITIAL  6.  *** Baseline:  Goal status: INITIAL  LONG TERM GOALS: Target date: 04/04/2023   Patient will acknowledge ***/10 pain at least once during episode of care   Baseline:  Goal status: INITIAL  2.  Patient will score at least ***% on FOTO to signify clinically meaningful improvement in functional abilities.   Baseline:  Goal status: INITIAL  3.  *** Baseline:  Goal status: INITIAL  4.  *** Baseline:  Goal status: INITIAL  5.  *** Baseline:  Goal status: INITIAL  6.  *** Baseline:  Goal status: INITIAL  PLAN:  PT FREQUENCY: 1-2x/week  PT DURATION: 6 weeks  PLANNED INTERVENTIONS: 97164- PT Re-evaluation, 97110-Therapeutic exercises, 97530- Therapeutic activity, 97112- Neuromuscular re-education, 97535- Self Care, 16109- Manual therapy, Patient/Family education, Joint mobilization, Cryotherapy, and Moist heat  PLAN FOR NEXT SESSION: HEP review and update, manual techniques as appropriate, aerobic tasks, ROM and flexibility activities, strengthening and PREs, TPDN, gait and balance training as needed     Hildred Laser, PT 02/21/2023, 2:02 PM

## 2023-02-22 ENCOUNTER — Ambulatory Visit: Payer: Medicaid Other

## 2023-03-03 NOTE — Therapy (Signed)
OUTPATIENT PHYSICAL THERAPY SHOULDER EVALUATION   Patient Name: Katherine Henson MRN: [id number] DOB:27-Oct-1960, 62 y.o., female Today's Date: 03/06/2023  END OF SESSION:  PT End of Session - 03/06/23 1445     Visit Number 1    Number of Visits 17    Date for PT Re-Evaluation 05/01/23    Authorization Type [employer]    Authorization Time Period auth tbd    PT Start Time 1446    PT Stop Time 1522    PT Time Calculation (min) 36 min    Activity Tolerance Patient tolerated treatment well             Past Medical History:  Diagnosis Date   Allergy    Arthritis    Cataract    bilateral removed   Chronic hepatitis C (HCC) 12/06/2018   treated for   Depression    GERD (gastroesophageal reflux disease)    HTN (hypertension)    Hyperlipidemia    Pre-diabetes    Stroke [facility])    mini stroke 2021   Past Surgical History:  Procedure Laterality Date   BIOPSY  01/26/2023   Procedure: BIOPSY;  Surgeon: Lemar Lofty., MD;  Location: [facility];  Service: Gastroenterology;;   COLONOSCOPY     ENDOSCOPIC MUCOSAL RESECTION  09/21/2020   Procedure: ENDOSCOPIC MUCOSAL RESECTION;  Surgeon: Lemar Lofty., MD;  Location: [facility];  Service: Gastroenterology;;   ENDOSCOPIC MUCOSAL RESECTION N/A 10/11/2021   Procedure: ENDOSCOPIC MUCOSAL RESECTION;  Surgeon: Lemar Lofty., MD;  Location: [facility];  Service: Gastroenterology;  Laterality: N/A;   ESOPHAGOGASTRODUODENOSCOPY (EGD) WITH PROPOFOL N/A 09/21/2020   Procedure: ESOPHAGOGASTRODUODENOSCOPY (EGD) WITH PROPOFOL;  Surgeon: Meridee Score Netty Starring., MD;  Location: [facility];  Service: Gastroenterology;  Laterality: N/A;   ESOPHAGOGASTRODUODENOSCOPY (EGD) WITH PROPOFOL N/A 10/11/2021   Procedure: ESOPHAGOGASTRODUODENOSCOPY (EGD) WITH PROPOFOL;  Surgeon: Meridee Score Netty Starring., MD;  Location: [facility];  Service: Gastroenterology;  Laterality: N/A;   ESOPHAGOGASTRODUODENOSCOPY (EGD) WITH  PROPOFOL N/A 01/26/2023   Procedure: ESOPHAGOGASTRODUODENOSCOPY (EGD) WITH PROPOFOL;  Surgeon: Meridee Score Netty Starring., MD;  Location: [facility];  Service: Gastroenterology;  Laterality: N/A;   HEMOSTASIS CLIP PLACEMENT  09/21/2020   Procedure: HEMOSTASIS CLIP PLACEMENT;  Surgeon: Lemar Lofty., MD;  Location: [facility];  Service: Gastroenterology;;   HEMOSTASIS CLIP PLACEMENT  10/11/2021   Procedure: HEMOSTASIS CLIP PLACEMENT;  Surgeon: Lemar Lofty., MD;  Location: [facility];  Service: Gastroenterology;;   NO PAST SURGERIES     POLYPECTOMY  10/11/2021   Procedure: POLYPECTOMY;  Surgeon: Lemar Lofty., MD;  Location: [facility];  Service: Gastroenterology;;   SUBMUCOSAL LIFTING INJECTION  09/21/2020   Procedure: SUBMUCOSAL LIFTING INJECTION;  Surgeon: Lemar Lofty., MD;  Location: [facility];  Service: Gastroenterology;;   SUBMUCOSAL LIFTING INJECTION  10/11/2021   Procedure: SUBMUCOSAL LIFTING INJECTION;  Surgeon: Lemar Lofty., MD;  Location: [facility];  Service: Gastroenterology;;   SUBMUCOSAL TATTOO INJECTION  10/11/2021   Procedure: SUBMUCOSAL TATTOO INJECTION;  Surgeon: Lemar Lofty., MD;  Location: [facility];  Service: Gastroenterology;;   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   Helicobacter pylori infection 07/22/2020   Esophagitis determined by endoscopy 07/22/2020   History of cerebellar stroke 11/26/2019   Mixed hyperlipidemia 11/26/2019  Positive double stranded DNA antibody test 04/10/2019   Chronic hepatitis C without hepatic coma (HCC) 12/06/2018   HTN (hypertension) 04/30/2018    PCP: Rema Fendt, NP  REFERRING PROVIDER: Persons, [place], [place]  REFERRING DIAG: [phone] (ICD-10-CM) - Acute pain of right shoulder  THERAPY DIAG:  Right shoulder pain, unspecified chronicity  Muscle weakness (generalized)  Rationale for Evaluation and Treatment: Rehabilitation  ONSET DATE: a bit over a month ago  SUBJECTIVE:                                                                                                                                                                                      SUBJECTIVE STATEMENT: Pt states a bit over a month ago she gradually developed R shoulder pain. Denies any injury or change in activity although she notes her job has repetitive/heavy demands. She describes intermittent N/T throughout lateral portion of upper arm, distally into dorsal aspect of all fingers. Also notes pain sometimes radiates in this distribution. She describes herself as fairly active in her day to day and has hard time recalling specific provocative factors other than sleeping (lying on arm) and reaching overhead. She also reports occasionally dropping objects.  She denies any fevers/chills, weight loss, headaches, or visual changes. No LUE N/T although she states she does occasionally get pain in that shoulder.  Hand dominance: Left  PERTINENT HISTORY: cataracts (removed), hep C, depression, GERD, HTN, prediabetes, hx stroke 2021  PAIN:  Are you having pain: 7/10 Location/description: R shoulder, occasionally going down into R hand.  Throbbing. Sometimes N/T in RUE, sometimes has some UT pain. Best-worst over past week: 4-10/10  - aggravating factors: reaching overhead, laying on shoulder - Easing factors: N/A    PRECAUTIONS: none  WEIGHT BEARING RESTRICTIONS: No  FALLS:  Has patient fallen in last 6 months? 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 ~6hours day.    PLOF: Independent  PATIENT GOALS: get stronger, understand pain better  NEXT MD VISIT: TBD per pt report  OBJECTIVE:  Note: Objective measures were completed at Evaluation unless otherwise noted.  DIAGNOSTIC FINDINGS:  R shoulder XR 02/02/23: "IMPRESSION: Acromioclavicular degenerative changes. 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, no recent changes or vision issues  POSTURE: Rounded shoulders, increased kyphosis  UPPER EXTREMITY ROM:  A/PROM Right eval Left eval  Shoulder flexion 104 deg * 144 deg  Shoulder abduction 99 deg * 125 deg  Shoulder internal rotation    Shoulder external rotation    Elbow flexion    Elbow extension    Wrist flexion    Wrist extension     (Blank rows = not tested) (Key: WFL = within functional limits not formally assessed, * = concordant pain, s = stiffness/stretching sensation, NT = not tested)  Comments:    UPPER EXTREMITY MMT:  MMT Right eval Left eval  Shoulder flexion 3+ * 5  Shoulder extension    Shoulder abduction 3+ * 5  Shoulder extension    Shoulder internal rotation 4  4+  Shoulder external rotation 4  4+  Elbow flexion    Elbow extension    Grip strength 30# 40#  (Blank rows = not tested)  (Key: WFL = within functional limits not formally assessed, * = concordant pain, s = stiffness/stretching sensation, NT = not tested)  Comments:   SHOULDER SPECIAL TESTS: deferred  JOINT MOBILITY TESTING:  deferred  PALPATION:  Significant TTP about shoulder girdle,  particularly R deltoid, UT, LS, infraspinatus/teres, bicipital groove. RUE numbness provoked by palpation of infraspinatus/teres                                                                                                                             TREATMENT DATE:  [facility] Adult PT Treatment:                                                DATE: 03/06/23 Therapeutic Exercise: Scapular retraction x8 Wall slides x4 (discontinued/modified to table slides given discomfort) HEP handout + education, rationale for interventions, monitoring symptoms   PATIENT EDUCATION: Education details: Pt education on PT impairments, prognosis, and POC. Informed consent. Rationale for interventions, safe/appropriate HEP performance Person educated: Patient Education method: Explanation, Demonstration, Tactile cues, Verbal cues Education comprehension: verbalized understanding, returned demonstration, verbal cues required, tactile cues required, and needs further education    HOME EXERCISE PROGRAM: Access Code: [email or web address] URL: [email or web address] Date: 03/06/2023 Prepared by: Fransisco Hertz  Exercises - Seated Scapular Retraction  - 2-3 x daily - 1 sets - 8 reps - Seated Shoulder Flexion Towel Slide at Table Top  - 2-3 x daily - 1 sets - 8 reps  ASSESSMENT:  CLINICAL IMPRESSION: Patient is a pleasant 62 y.o. woman who was seen today for physical therapy evaluation and treatment for R shoulder pain ongoing for over a month. Pt denies MOI  or change in activity but does endorse heavy/repetitive UE use for job. Red flag screening/questioning reassuring overall. On exam pt demonstrates notable R GH mobility limitations and reduction in strength, significant TTP about shoulder girdle. Tolerates HEP well with modifications as above, no adverse events, denies any overt increase in pain on departure. Recommend trial of skilled PT to address aforementioned deficits with aim of improving functional  tolerance and reducing pain with typical activities. Pt departs today's session in no acute distress, all voiced concerns/questions addressed appropriately from PT perspective.      OBJECTIVE IMPAIRMENTS: decreased activity tolerance, decreased endurance, decreased mobility, decreased ROM, decreased strength, impaired perceived functional ability, impaired sensation, impaired UE functional use, postural dysfunction, and pain.   ACTIVITY LIMITATIONS: carrying, lifting, sleeping, reach over head, and locomotion level  PARTICIPATION LIMITATIONS: meal prep, cleaning, laundry, community activity, and occupation  PERSONAL FACTORS: Age and 3+ comorbidities: hep C, depression, GERD, HTN, prediabetes, hx stroke 2021  are also affecting patient's functional outcome.   REHAB POTENTIAL: Good  CLINICAL DECISION MAKING: Evolving/moderate complexity  EVALUATION COMPLEXITY: Moderate   GOALS: Goals reviewed with patient? Yes  SHORT TERM GOALS: Target date: 04/03/2023  Pt will demonstrate appropriate understanding and performance of initially prescribed HEP in order to facilitate improved independence with management of symptoms.  Baseline: HEP provided on eval Goal status: INITIAL   2. Pt will score greater than or equal to 57 on FOTO in order to demonstrate improved perception of function due to symptoms.  Baseline: 53  Goal status: INITIAL    LONG TERM GOALS: Target date: 05/01/2023   Pt will score 61 on FOTO in order to demonstrate improved perception of function due to symptoms. Baseline: 53 Goal status: INITIAL  2.  Pt will demonstrate at least 140 degrees of active shoulder elevation on RUE in order to demonstrate improved tolerance to functional movement patterns such as reaching overhead.  Baseline: see ROM chart above Goal status: INITIAL  3.  Pt will demonstrate at least 4+/5 shoulder flex/abd MMT for improved symmetry of UE strength and improved tolerance to functional movements.   Baseline: see MMT chart above Goal status: INITIAL  4. Pt will demonstrate RUE grip strength at least within 5# of symmetrical in order to facilitate improved functional strength.   Baseline: see MMT chart above  Goal status: INITIAL   5. Pt will demonstrate appropriate performance of final prescribed HEP in order to facilitate improved self-management of symptoms post-discharge.   Baseline: initial HEP prescribed  Goal status: INITIAL    6. Pt will report at least 50% decrease in overall pain levels in past week in order to facilitate improved tolerance to basic ADLs/mobility.   Baseline: 4-10/10  Goal status: INITIAL    PLAN:  PT FREQUENCY: 2x/week  PT DURATION: 8 weeks  PLANNED INTERVENTIONS: 97164- PT Re-evaluation, 97110-Therapeutic exercises, 97530- Therapeutic activity, 97112- Neuromuscular re-education, 97535- Self Care, [id number]- Manual therapy, Patient/Family education, Balance training, Stair training, Taping, Dry Needling, Joint mobilization, Spinal mobilization, Cryotherapy, and Moist heat  PLAN FOR NEXT SESSION: Review/update HEP PRN. Work on [employer] exercises as appropriate with emphasis on improving comfort with periscapular/GH muscular contractions. Gentle GH mobility within pt tolerance. Symptom modification strategies as indicated/appropriate.    Ashley Murrain PT, DPT 03/06/2023 4:59 PM    For all possible CPT codes, reference the Planned Interventions line above.     Check all conditions that are expected to impact treatment: {Conditions expected to impact treatment:Musculoskeletal disorders   If treatment provided  at initial evaluation, no treatment charged due to lack of authorization.

## 2023-03-06 ENCOUNTER — Ambulatory Visit: Payer: Medicaid Other | Attending: Physician Assistant | Admitting: Physical Therapy

## 2023-03-06 ENCOUNTER — Encounter: Payer: Self-pay | Admitting: Physical Therapy

## 2023-03-06 ENCOUNTER — Other Ambulatory Visit: Payer: Self-pay | Admitting: Family

## 2023-03-06 DIAGNOSIS — M6281 Muscle weakness (generalized): Secondary | ICD-10-CM | POA: Insufficient documentation

## 2023-03-06 DIAGNOSIS — M25511 Pain in right shoulder: Secondary | ICD-10-CM | POA: Insufficient documentation

## 2023-03-06 DIAGNOSIS — Z0000 Encounter for general adult medical examination without abnormal findings: Secondary | ICD-10-CM

## 2023-03-13 ENCOUNTER — Ambulatory Visit: Payer: Medicaid Other | Admitting: Physical Therapy

## 2023-03-16 ENCOUNTER — Ambulatory Visit: Payer: Medicaid Other | Admitting: Physical Therapy

## 2023-03-20 ENCOUNTER — Ambulatory Visit: Payer: Medicaid Other | Admitting: Physical Therapy

## 2023-03-21 ENCOUNTER — Ambulatory Visit: Payer: Medicaid Other | Attending: Physician Assistant | Admitting: Physical Therapy

## 2023-03-21 ENCOUNTER — Encounter: Payer: Self-pay | Admitting: Physical Therapy

## 2023-03-21 DIAGNOSIS — M6281 Muscle weakness (generalized): Secondary | ICD-10-CM | POA: Insufficient documentation

## 2023-03-21 DIAGNOSIS — M25511 Pain in right shoulder: Secondary | ICD-10-CM | POA: Insufficient documentation

## 2023-03-21 NOTE — Therapy (Signed)
 OUTPATIENT PHYSICAL THERAPY SHOULDER EVALUATION   Patient Name: Katherine Henson MRN: [id number] DOB:[birth year], 63 y.o., female Today's Date: 03/21/2023  END OF SESSION:  PT End of Session - 03/21/23 0921     Visit Number 2    Number of Visits 17    Date for PT Re-Evaluation 05/01/23    PT Start Time 0917    PT Stop Time 0955    PT Time Calculation (min) 38 min    Activity Tolerance Patient tolerated treatment well    Behavior During Therapy [facility] for tasks assessed/performed              Past Medical History:  Diagnosis Date   Allergy    Arthritis    Cataract    bilateral removed   Chronic hepatitis C (HCC) 12/06/2018   treated for   Depression    GERD (gastroesophageal reflux disease)    HTN (hypertension)    Hyperlipidemia    Pre-diabetes    Stroke [facility])    mini stroke 2021   Past Surgical History:  Procedure Laterality Date   BIOPSY  01/26/2023   Procedure: BIOPSY;  Surgeon: Wilhelmenia Aloha Raddle., MD;  Location: [facility];  Service: Gastroenterology;;   COLONOSCOPY     ENDOSCOPIC MUCOSAL RESECTION  09/21/2020   Procedure: ENDOSCOPIC MUCOSAL RESECTION;  Surgeon: Wilhelmenia Aloha Raddle., MD;  Location: [facility];  Service: Gastroenterology;;   ENDOSCOPIC MUCOSAL RESECTION N/A 10/11/2021   Procedure: ENDOSCOPIC MUCOSAL RESECTION;  Surgeon: Wilhelmenia Aloha Raddle., MD;  Location: [facility];  Service: Gastroenterology;  Laterality: N/A;   ESOPHAGOGASTRODUODENOSCOPY (EGD) WITH PROPOFOL  N/A 09/21/2020   Procedure: ESOPHAGOGASTRODUODENOSCOPY (EGD) WITH PROPOFOL ;  Surgeon: Wilhelmenia Aloha Raddle., MD;  Location: [facility];  Service: Gastroenterology;  Laterality: N/A;   ESOPHAGOGASTRODUODENOSCOPY (EGD) WITH PROPOFOL  N/A 10/11/2021   Procedure: ESOPHAGOGASTRODUODENOSCOPY (EGD) WITH PROPOFOL ;  Surgeon: Wilhelmenia Aloha Raddle., MD;  Location: [facility];  Service: Gastroenterology;  Laterality: N/A;   ESOPHAGOGASTRODUODENOSCOPY (EGD) WITH PROPOFOL  N/A  01/26/2023   Procedure: ESOPHAGOGASTRODUODENOSCOPY (EGD) WITH PROPOFOL ;  Surgeon: Wilhelmenia Aloha Raddle., MD;  Location: [facility];  Service: Gastroenterology;  Laterality: N/A;   HEMOSTASIS CLIP PLACEMENT  09/21/2020   Procedure: HEMOSTASIS CLIP PLACEMENT;  Surgeon: Wilhelmenia Aloha Raddle., MD;  Location: [facility];  Service: Gastroenterology;;   HEMOSTASIS CLIP PLACEMENT  10/11/2021   Procedure: HEMOSTASIS CLIP PLACEMENT;  Surgeon: Wilhelmenia Aloha Raddle., MD;  Location: [facility];  Service: Gastroenterology;;   NO PAST SURGERIES     POLYPECTOMY  10/11/2021   Procedure: POLYPECTOMY;  Surgeon: Wilhelmenia Aloha Raddle., MD;  Location: [facility];  Service: Gastroenterology;;   SUBMUCOSAL LIFTING INJECTION  09/21/2020   Procedure: SUBMUCOSAL LIFTING INJECTION;  Surgeon: Wilhelmenia Aloha Raddle., MD;  Location: [facility];  Service: Gastroenterology;;   SUBMUCOSAL LIFTING INJECTION  10/11/2021   Procedure: SUBMUCOSAL LIFTING INJECTION;  Surgeon: Wilhelmenia Aloha Raddle., MD;  Location: [facility];  Service: Gastroenterology;;   SUBMUCOSAL TATTOO INJECTION  10/11/2021   Procedure: SUBMUCOSAL TATTOO INJECTION;  Surgeon: Wilhelmenia Aloha Raddle., MD;  Location: [facility];  Service: Gastroenterology;;   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   Helicobacter pylori infection 07/22/2020   Esophagitis determined by endoscopy 07/22/2020   History of cerebellar stroke 11/26/2019   Mixed hyperlipidemia 11/26/2019   Positive double stranded DNA antibody  test 04/10/2019   Chronic hepatitis C without hepatic coma (HCC) 12/06/2018   HTN (hypertension) 04/30/2018    PCP: Lorren Greig PARAS, NP  REFERRING PROVIDER: Persons, Ronal Dragon,  PA  REFERRING DIAG: M25.511 (ICD-10-CM) - Acute pain of right shoulder  THERAPY DIAG:  Right shoulder pain, unspecified chronicity  Muscle weakness (generalized)  Rationale for Evaluation and Treatment: Rehabilitation  ONSET DATE: a bit over a month ago  SUBJECTIVE:                                                                                                                                                                                      SUBJECTIVE STATEMENT: Pt stated having compliance with HEP and overall shoulder is feeling better. Pt   PERTINENT HISTORY: cataracts (removed), hep C, depression, GERD, HTN, prediabetes, hx stroke 2021  PAIN:  Are you having pain: 7/10 Location/description: R shoulder, occasionally going down into R hand.  Throbbing. Sometimes N/T in RUE, sometimes has some UT pain. Best-worst over past week: 4-10/10  - aggravating factors: reaching overhead, laying on shoulder - Easing factors: N/A    PRECAUTIONS: none  WEIGHT BEARING RESTRICTIONS: No  FALLS:  Has patient fallen in last 6 months? 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 ~6hours day.   PLOF: Independent  PATIENT GOALS: get stronger, understand pain better  NEXT MD VISIT: TBD per pt report  OBJECTIVE:  Note: Objective measures were completed at Evaluation unless otherwise noted.  DIAGNOSTIC FINDINGS:  R shoulder XR 02/02/23: IMPRESSION: Acromioclavicular degenerative changes. No acute osseous abnormalities.   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, no recent changes or vision issues  POSTURE: Rounded shoulders, increased kyphosis  UPPER EXTREMITY ROM:  A/PROM Right eval Left eval  Shoulder flexion 104 deg * 144 deg  Shoulder abduction 99 deg * 125 deg  Shoulder internal rotation    Shoulder external rotation    Elbow flexion    Elbow extension    Wrist flexion    Wrist extension     (Blank rows = not tested) (  Key: WFL = within functional limits not formally assessed, * = concordant pain, s = stiffness/stretching sensation, NT = not tested)  Comments:    UPPER EXTREMITY MMT:  MMT Right eval Left eval  Shoulder flexion 3+ * 5  Shoulder extension    Shoulder abduction 3+ * 5  Shoulder extension    Shoulder internal rotation 4  4+  Shoulder external rotation 4  4+  Elbow flexion    Elbow extension    Grip strength 30# 40#  (Blank rows = not tested)  (Key: WFL = within functional limits not formally assessed, * = concordant pain, s = stiffness/stretching sensation, NT = not tested)  Comments:   SHOULDER SPECIAL TESTS: deferred  JOINT MOBILITY TESTING:  deferred  PALPATION:  Significant TTP about shoulder girdle, particularly R deltoid, UT, LS, infraspinatus/teres, bicipital groove. RUE numbness provoked by palpation of infraspinatus/teres   [facility] Adult PT Treatment:                                                DATE: 03/21/2023  Therapeutic Exercise: UE ranger 3x12 in scaption Banded ER isometric through shoulder flexion ROM, 3x6 5s hold yellow Keep elbows at 90 scapula retracted, and neutral/slight ER rotation, flex shoulders until pain is noted, or until 100 degrees is reached. Banded abduction, Full ROM. 3x10 yellow Banded Scapular retraction, 2x8 green Prone retracted Ys w/DB, 2x8 #BW Thumbs up   [facility] Adult PT Treatment:                                                 DATE: 03/06/23 Therapeutic Exercise: Scapular retraction x8 Wall slides x4 (discontinued/modified to table slides given discomfort) HEP handout + education, rationale for interventions, monitoring symptoms   PATIENT EDUCATION: Education details: Pt education on PT impairments, prognosis, and POC. Informed consent. Rationale for interventions, safe/appropriate HEP performance Person educated: Patient Education method: Explanation, Demonstration, Tactile cues, Verbal cues Education comprehension: verbalized understanding, returned demonstration, verbal cues required, tactile cues required, and needs further education    HOME EXERCISE PROGRAM: Access Code: [email or web address] URL: [email or web address] Date: 03/06/2023 Prepared by: Alm Jenny  Exercises - Seated Scapular Retraction  - 2-3 x daily - 1 sets - 8 reps - Seated Shoulder Flexion Towel Slide at Table Top  - 2-3 x daily - 1 sets - 8 reps  ASSESSMENT:  CLINICAL IMPRESSION: Pt attended physical therapy session for continuation of treatment regarding Shoulder pain and dysfunction. Pt tolerated treatment great and demonstrated improvement with active shoulder motion, shoulder girdle strength and mobility. Some difficulties were noted with shoulder stability above 100 degrees of flexion scaption that improved throughout the session.  Pt required moderate for safe and appropriate performance of today's activity progression.      Eval impression (03/06/2023): Patient is a pleasant 63 y.o. woman who was seen today for physical therapy evaluation and treatment for R shoulder pain ongoing for over a month. Pt denies MOI or change in activity but does endorse heavy/repetitive UE use for job. Red flag screening/questioning reassuring overall. On exam pt demonstrates notable R GH mobility limitations and reduction in strength, significant TTP about shoulder girdle. Tolerates HEP well with modifications as above, no adverse  events, denies any overt  increase in pain on departure. Recommend trial of skilled PT to address aforementioned deficits with aim of improving functional tolerance and reducing pain with typical activities. Pt departs today's session in no acute distress, all voiced concerns/questions addressed appropriately from PT perspective.      OBJECTIVE IMPAIRMENTS: decreased activity tolerance, decreased endurance, decreased mobility, decreased ROM, decreased strength, impaired perceived functional ability, impaired sensation, impaired UE functional use, postural dysfunction, and pain.   ACTIVITY LIMITATIONS: carrying, lifting, sleeping, reach over head, and locomotion level  PARTICIPATION LIMITATIONS: meal prep, cleaning, laundry, community activity, and occupation  PERSONAL FACTORS: Age and 3+ comorbidities: hep C, depression, GERD, HTN, prediabetes, hx stroke 2021  are also affecting patient's functional outcome.   REHAB POTENTIAL: Good  CLINICAL DECISION MAKING: Evolving/moderate complexity  EVALUATION COMPLEXITY: Moderate   GOALS: Goals reviewed with patient? Yes  SHORT TERM GOALS: Target date: 04/03/2023  Pt will demonstrate appropriate understanding and performance of initially prescribed HEP in order to facilitate improved independence with management of symptoms.  Baseline: HEP provided on eval Goal status: INITIAL   2. Pt will score greater than or equal to 57 on FOTO in order to demonstrate improved perception of function due to symptoms.  Baseline: 53  Goal status: INITIAL    LONG TERM GOALS: Target date: 05/01/2023   Pt will score 61 on FOTO in order to demonstrate improved perception of function due to symptoms. Baseline: 53 Goal status: INITIAL  2.  Pt will demonstrate at least 140 degrees of active shoulder elevation on RUE in order to demonstrate improved tolerance to functional movement patterns such as reaching overhead.  Baseline: see ROM chart above Goal status:  INITIAL  3.  Pt will demonstrate at least 4+/5 shoulder flex/abd MMT for improved symmetry of UE strength and improved tolerance to functional movements.  Baseline: see MMT chart above Goal status: INITIAL  4. Pt will demonstrate RUE grip strength at least within 5# of symmetrical in order to facilitate improved functional strength.   Baseline: see MMT chart above  Goal status: INITIAL   5. Pt will demonstrate appropriate performance of final prescribed HEP in order to facilitate improved self-management of symptoms post-discharge.   Baseline: initial HEP prescribed  Goal status: INITIAL    6. Pt will report at least 50% decrease in overall pain levels in past week in order to facilitate improved tolerance to basic ADLs/mobility.   Baseline: 4-10/10  Goal status: INITIAL    PLAN:  PT FREQUENCY: 2x/week  PT DURATION: 8 weeks  PLANNED INTERVENTIONS: [id number]- PT Re-evaluation, 97110-Therapeutic exercises, 97530- Therapeutic activity, 97112- Neuromuscular re-education, 97535- Self Care, [id number]- Manual therapy, Patient/Family education, Balance training, Stair training, Taping, Dry Needling, Joint mobilization, Spinal mobilization, Cryotherapy, and Moist heat  PLAN FOR NEXT SESSION: Review/update HEP PRN. Work on [employer] exercises as appropriate with emphasis on improving comfort with periscapular/GH muscular contractions. Gentle GH mobility within pt tolerance. Symptom modification strategies as indicated/appropriate.    Mabel Kiang, PT, DPT 03/21/2023, 9:55 AM

## 2023-03-23 ENCOUNTER — Encounter: Payer: Self-pay | Admitting: Physical Therapy

## 2023-03-23 ENCOUNTER — Ambulatory Visit: Payer: Medicaid Other | Admitting: Physical Therapy

## 2023-03-23 DIAGNOSIS — M6281 Muscle weakness (generalized): Secondary | ICD-10-CM

## 2023-03-23 DIAGNOSIS — M25511 Pain in right shoulder: Secondary | ICD-10-CM

## 2023-03-23 NOTE — Therapy (Signed)
OUTPATIENT PHYSICAL THERAPY SHOULDER EVALUATION   Patient Name: Katherine Henson MRN: [id number] DOB:1960/06/01, 63 y.o., female Today's Date: 03/23/2023  END OF SESSION:  PT End of Session - 03/23/23 1547     Visit Number 3    Number of Visits 17    Date for PT Re-Evaluation 05/01/23    PT Start Time 1552    PT Stop Time 1630    PT Time Calculation (min) 38 min    Activity Tolerance Patient tolerated treatment well    Behavior During Therapy [facility] for tasks assessed/performed               Past Medical History:  Diagnosis Date   Allergy    Arthritis    Cataract    bilateral removed   Chronic hepatitis C (HCC) 12/06/2018   treated for   Depression    GERD (gastroesophageal reflux disease)    HTN (hypertension)    Hyperlipidemia    Pre-diabetes    Stroke [facility])    mini stroke 2021   Past Surgical History:  Procedure Laterality Date   BIOPSY  01/26/2023   Procedure: BIOPSY;  Surgeon: Lemar Lofty., MD;  Location: [facility];  Service: Gastroenterology;;   COLONOSCOPY     ENDOSCOPIC MUCOSAL RESECTION  09/21/2020   Procedure: ENDOSCOPIC MUCOSAL RESECTION;  Surgeon: Lemar Lofty., MD;  Location: [facility];  Service: Gastroenterology;;   ENDOSCOPIC MUCOSAL RESECTION N/A 10/11/2021   Procedure: ENDOSCOPIC MUCOSAL RESECTION;  Surgeon: Lemar Lofty., MD;  Location: [facility];  Service: Gastroenterology;  Laterality: N/A;   ESOPHAGOGASTRODUODENOSCOPY (EGD) WITH PROPOFOL N/A 09/21/2020   Procedure: ESOPHAGOGASTRODUODENOSCOPY (EGD) WITH PROPOFOL;  Surgeon: Meridee Score Netty Starring., MD;  Location: [facility];  Service: Gastroenterology;  Laterality: N/A;   ESOPHAGOGASTRODUODENOSCOPY (EGD) WITH PROPOFOL N/A 10/11/2021   Procedure: ESOPHAGOGASTRODUODENOSCOPY (EGD) WITH PROPOFOL;  Surgeon: Meridee Score Netty Starring., MD;  Location: [facility];  Service: Gastroenterology;  Laterality: N/A;   ESOPHAGOGASTRODUODENOSCOPY (EGD) WITH PROPOFOL N/A  01/26/2023   Procedure: ESOPHAGOGASTRODUODENOSCOPY (EGD) WITH PROPOFOL;  Surgeon: Meridee Score Netty Starring., MD;  Location: [facility];  Service: Gastroenterology;  Laterality: N/A;   HEMOSTASIS CLIP PLACEMENT  09/21/2020   Procedure: HEMOSTASIS CLIP PLACEMENT;  Surgeon: Lemar Lofty., MD;  Location: [facility];  Service: Gastroenterology;;   HEMOSTASIS CLIP PLACEMENT  10/11/2021   Procedure: HEMOSTASIS CLIP PLACEMENT;  Surgeon: Lemar Lofty., MD;  Location: [facility];  Service: Gastroenterology;;   NO PAST SURGERIES     POLYPECTOMY  10/11/2021   Procedure: POLYPECTOMY;  Surgeon: Lemar Lofty., MD;  Location: [facility];  Service: Gastroenterology;;   SUBMUCOSAL LIFTING INJECTION  09/21/2020   Procedure: SUBMUCOSAL LIFTING INJECTION;  Surgeon: Lemar Lofty., MD;  Location: [facility];  Service: Gastroenterology;;   SUBMUCOSAL LIFTING INJECTION  10/11/2021   Procedure: SUBMUCOSAL LIFTING INJECTION;  Surgeon: Lemar Lofty., MD;  Location: [facility];  Service: Gastroenterology;;   SUBMUCOSAL TATTOO INJECTION  10/11/2021   Procedure: SUBMUCOSAL TATTOO INJECTION;  Surgeon: Lemar Lofty., MD;  Location: [facility];  Service: Gastroenterology;;   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   Helicobacter pylori infection 07/22/2020   Esophagitis determined by endoscopy 07/22/2020   History of cerebellar stroke 11/26/2019   Mixed hyperlipidemia 11/26/2019   Positive double stranded DNA  antibody test 04/10/2019   Chronic hepatitis C without hepatic coma (HCC) 12/06/2018   HTN (hypertension) 04/30/2018    PCP: Rema Fendt, NP  REFERRING PROVIDER: [place], [place]  REFERRING DIAG: [phone] (ICD-10-CM) - Acute pain of right shoulder  THERAPY DIAG:  Muscle weakness (generalized)  Right shoulder pain, unspecified chronicity  Rationale for Evaluation and Treatment: Rehabilitation  ONSET DATE: a bit over a month ago  SUBJECTIVE:                                                                                                                                                                                      SUBJECTIVE STATEMENT: Pt stated shoulder is sore from previous session, however feels a little bit stronger and everything functionally feels a little bit easier  PERTINENT HISTORY: cataracts (removed), hep C, depression, GERD, HTN, prediabetes, hx stroke 2021  PAIN:  Are you having pain: 7/10 Location/description: R shoulder, occasionally going down into R hand.  Throbbing. Sometimes N/T in RUE, sometimes has some UT pain. Best-worst over past week: 4-10/10  - aggravating factors: reaching overhead, laying on shoulder - Easing factors: N/A    PRECAUTIONS: none  WEIGHT BEARING RESTRICTIONS: No  FALLS:  Has patient fallen in last 6 months? 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 ~6hours day.   PLOF: Independent  PATIENT GOALS: get stronger, understand pain better  NEXT MD VISIT: TBD per pt report  OBJECTIVE:  Note: Objective measures were completed at Evaluation unless otherwise noted.  DIAGNOSTIC FINDINGS:  R shoulder XR 02/02/23: "IMPRESSION: Acromioclavicular degenerative changes. 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, no recent changes or vision issues  POSTURE: Rounded shoulders, increased kyphosis  UPPER EXTREMITY ROM:  A/PROM Right eval Left eval  Shoulder flexion 104 deg * 144 deg  Shoulder abduction 99 deg * 125 deg  Shoulder internal rotation    Shoulder external rotation    Elbow flexion    Elbow extension    Wrist flexion    Wrist extension     (  Blank rows = not tested) (Key: WFL = within functional limits not formally assessed, * = concordant pain, s = stiffness/stretching sensation, NT = not tested)  Comments:    UPPER EXTREMITY MMT:  MMT Right eval Left eval  Shoulder flexion 3+ * 5  Shoulder extension    Shoulder abduction 3+ * 5  Shoulder extension    Shoulder internal rotation 4  4+  Shoulder external rotation 4  4+  Elbow flexion    Elbow extension    Grip strength 30# 40#  (Blank rows = not tested)  (Key: WFL = within functional limits not formally assessed, * = concordant pain, s = stiffness/stretching sensation, NT = not tested)  Comments:   SHOULDER SPECIAL TESTS: deferred  JOINT MOBILITY TESTING:  deferred  PALPATION:  Significant TTP about shoulder girdle, particularly R deltoid, UT, LS, infraspinatus/teres, bicipital groove. RUE numbness provoked by palpation of infraspinatus/teres   [facility] Adult PT Treatment:                                                DATE: 03/23/2023 Therapeutic Exercise: UBE 5' alternate fwd/bkwd every 1' Finger board climb 2x12 (Pt states being too sore to continue therapy, refused rest of session). Manual Scal[place], upper trap, supraspinatus release  [facility] Adult PT Treatment:                                                DATE: 03/21/2023 Therapeutic Exercise: UE ranger 3x12 in scaption Banded ER isometric through shoulder flexion  ROM, 3x6 5s hold yellow Keep elbows at 90 scapula retracted, and neutral/slight ER rotation, flex shoulders until pain is noted, or until 100 degrees is reached. Banded abduction, Full ROM. 3x10 yellow Banded Scapular retraction, 2x8 green Prone retracted Ys w/DB, 2x8 #BW Thumbs up   PATIENT EDUCATION: Education details: Pt education on PT impairments, prognosis, and POC. Informed consent. Rationale for interventions, safe/appropriate HEP performance Person educated: Patient Education method: Explanation, Demonstration, Tactile cues, Verbal cues Education comprehension: verbalized understanding, returned demonstration, verbal cues required, tactile cues required, and needs further education    HOME EXERCISE PROGRAM: Access Code: [email or web address] URL: [email or web address] Date: 03/06/2023 Prepared by: Fransisco Hertz  Exercises - Seated Scapular Retraction  - 2-3 x daily - 1 sets - 8 reps - Seated Shoulder Flexion Towel Slide at Table Top  - 2-3 x daily - 1 sets - 8 reps  ASSESSMENT:  CLINICAL IMPRESSION: Pt attended physical therapy session for continuation of treatment regarding R shoulder pain and dysfunction. Pt tolerated treatment poorly, since last session demonstrated improvement with global shoulder strength and mobility. Some difficulties were noted with activtiy tolerance and increase in muscle tension . Pt was educated on benefits of low intensity repetitive motion for improving pain and reducing muscle soreness. Pt refused to continue treatment and agreed to schedule another visit for next week.     Eval impression (03/06/2023): Patient is a pleasant 63 y.o. woman who was seen today for physical therapy evaluation and treatment for R shoulder pain ongoing for over a month. Pt denies MOI or change in activity but does endorse heavy/repetitive UE use for job. Red flag screening/questioning reassuring overall. On exam pt demonstrates notable R GH mobility limitations and  reduction in strength, significant TTP about shoulder girdle. Tolerates HEP well with modifications as above, no adverse events, denies any overt increase in pain on departure. Recommend trial of skilled PT to address aforementioned deficits with aim of improving functional tolerance and reducing pain with typical activities. Pt departs today's session in no acute distress, all voiced concerns/questions addressed appropriately from PT perspective.      OBJECTIVE IMPAIRMENTS: decreased activity tolerance, decreased endurance, decreased mobility, decreased ROM, decreased strength, impaired perceived functional ability, impaired sensation, impaired UE functional use, postural dysfunction, and pain.   ACTIVITY LIMITATIONS: carrying, lifting, sleeping, reach over head, and locomotion level  PARTICIPATION LIMITATIONS: meal prep, cleaning, laundry, community activity, and occupation  PERSONAL FACTORS: Age and 3+ comorbidities: hep C, depression, GERD, HTN, prediabetes, hx stroke 2021  are also affecting patient's functional outcome.   REHAB POTENTIAL: Good  CLINICAL DECISION MAKING: Evolving/moderate complexity  EVALUATION COMPLEXITY: Moderate   GOALS: Goals reviewed with patient? Yes  SHORT TERM GOALS: Target date: 04/03/2023  Pt will demonstrate appropriate understanding and performance of initially prescribed HEP in order to facilitate improved independence with management of symptoms.  Baseline: HEP provided on eval Goal status: INITIAL   2. Pt will score greater than or equal to 57 on FOTO in order to demonstrate improved perception of function due to symptoms.  Baseline: 53  Goal status: INITIAL    LONG TERM GOALS: Target date: 05/01/2023   Pt will score 61 on FOTO in order to demonstrate improved perception of function due to symptoms. Baseline: 53 Goal status: INITIAL  2.  Pt will demonstrate at least 140 degrees of active shoulder elevation on RUE in order to demonstrate improved  tolerance to functional movement patterns such as reaching overhead.  Baseline: see ROM chart above Goal status: INITIAL  3.  Pt will demonstrate at least 4+/5 shoulder flex/abd MMT for improved symmetry of UE strength and improved tolerance to functional movements.  Baseline: see MMT chart above Goal status: INITIAL  4. Pt will demonstrate RUE grip strength at least within 5# of symmetrical in order to facilitate improved functional strength.   Baseline: see MMT chart above  Goal status: INITIAL   5. Pt will demonstrate appropriate performance of final prescribed HEP in order to facilitate improved self-management of symptoms post-discharge.   Baseline: initial HEP prescribed  Goal status: INITIAL    6. Pt will report at least 50% decrease in overall pain levels in past week in order to facilitate improved tolerance to basic ADLs/mobility.   Baseline: 4-10/10  Goal status: INITIAL    PLAN:  PT FREQUENCY: 2x/week  PT DURATION: 8 weeks  PLANNED INTERVENTIONS: 97164- PT Re-evaluation, 97110-Therapeutic exercises, 97530- Therapeutic activity, 97112- Neuromuscular re-education, 97535- Self Care, 16109- Manual therapy, Patient/Family education, Balance training, Stair training, Taping, Dry Needling, Joint mobilization, Spinal mobilization, Cryotherapy, and Moist heat  PLAN FOR NEXT SESSION: Review/update HEP PRN. Work on [employer] exercises as appropriate with emphasis on improving comfort with periscapular/GH muscular contractions. Gentle GH mobility within pt tolerance. Symptom modification strategies as indicated/appropriate.    Sheliah Plane, PT, DPT 03/21/2023, 9:55 AM

## 2023-03-28 ENCOUNTER — Telehealth: Payer: Self-pay

## 2023-03-28 DIAGNOSIS — A048 Other specified bacterial intestinal infections: Secondary | ICD-10-CM

## 2023-03-28 NOTE — Telephone Encounter (Signed)
Left message on machine to call back  

## 2023-03-28 NOTE — Telephone Encounter (Signed)
-----   Message from Nurse Brighid Koch P sent at 01/31/2023 12:47 PM EST -----  [employer] H. pylori testing in 6 to 8 weeks.

## 2023-03-29 ENCOUNTER — Encounter: Payer: Medicaid Other | Admitting: Physical Therapy

## 2023-03-29 NOTE — Telephone Encounter (Signed)
Patient is returning your call.  

## 2023-03-29 NOTE — Telephone Encounter (Signed)
The pt returned call- she will pick up kit tomorrow

## 2023-03-29 NOTE — Telephone Encounter (Addendum)
[employer] stool kit left at the front desk on the 2ND floor for pick up.  Left message on machine to call back

## 2023-03-30 NOTE — Telephone Encounter (Signed)
Patient called stating she will not be able to pick up kit until Monday, wanted to let nurse know.

## 2023-03-30 NOTE — Telephone Encounter (Signed)
The pt has been advised that she can pick up as she is able.  The pt has been advised of the information and verbalized understanding.

## 2023-04-03 ENCOUNTER — Encounter: Payer: Self-pay | Admitting: Gastroenterology

## 2023-04-05 ENCOUNTER — Encounter: Payer: Self-pay | Admitting: Physical Therapy

## 2023-04-05 ENCOUNTER — Ambulatory Visit: Payer: Medicaid Other | Admitting: Physical Therapy

## 2023-04-05 DIAGNOSIS — M25511 Pain in right shoulder: Secondary | ICD-10-CM

## 2023-04-05 DIAGNOSIS — M6281 Muscle weakness (generalized): Secondary | ICD-10-CM | POA: Diagnosis not present

## 2023-04-05 NOTE — Therapy (Signed)
OUTPATIENT PHYSICAL THERAPY SHOULDER EVALUATION   Patient Name: Katherine Henson MRN: [id number] DOB:March 31, 1960, 63 y.o., female Today's Date: 04/05/2023  END OF SESSION:  PT End of Session - 04/05/23 1447     Visit Number 4    Number of Visits 17    Date for PT Re-Evaluation 05/01/23    PT Start Time 1445    PT Stop Time 1530    PT Time Calculation (min) 45 min    Activity Tolerance Patient tolerated treatment well    Behavior During Therapy [facility] for tasks assessed/performed                Past Medical History:  Diagnosis Date   Allergy    Arthritis    Cataract    bilateral removed   Chronic hepatitis C (HCC) 12/06/2018   treated for   Depression    GERD (gastroesophageal reflux disease)    HTN (hypertension)    Hyperlipidemia    Pre-diabetes    Stroke [facility])    mini stroke 2021   Past Surgical History:  Procedure Laterality Date   BIOPSY  01/26/2023   Procedure: BIOPSY;  Surgeon: Lemar Lofty., MD;  Location: [facility];  Service: Gastroenterology;;   COLONOSCOPY     ENDOSCOPIC MUCOSAL RESECTION  09/21/2020   Procedure: ENDOSCOPIC MUCOSAL RESECTION;  Surgeon: Lemar Lofty., MD;  Location: [facility];  Service: Gastroenterology;;   ENDOSCOPIC MUCOSAL RESECTION N/A 10/11/2021   Procedure: ENDOSCOPIC MUCOSAL RESECTION;  Surgeon: Lemar Lofty., MD;  Location: [facility];  Service: Gastroenterology;  Laterality: N/A;   ESOPHAGOGASTRODUODENOSCOPY (EGD) WITH PROPOFOL N/A 09/21/2020   Procedure: ESOPHAGOGASTRODUODENOSCOPY (EGD) WITH PROPOFOL;  Surgeon: Meridee Score Netty Starring., MD;  Location: [facility];  Service: Gastroenterology;  Laterality: N/A;   ESOPHAGOGASTRODUODENOSCOPY (EGD) WITH PROPOFOL N/A 10/11/2021   Procedure: ESOPHAGOGASTRODUODENOSCOPY (EGD) WITH PROPOFOL;  Surgeon: Meridee Score Netty Starring., MD;  Location: [facility];  Service: Gastroenterology;  Laterality: N/A;   ESOPHAGOGASTRODUODENOSCOPY (EGD) WITH PROPOFOL N/A  01/26/2023   Procedure: ESOPHAGOGASTRODUODENOSCOPY (EGD) WITH PROPOFOL;  Surgeon: Meridee Score Netty Starring., MD;  Location: [facility];  Service: Gastroenterology;  Laterality: N/A;   HEMOSTASIS CLIP PLACEMENT  09/21/2020   Procedure: HEMOSTASIS CLIP PLACEMENT;  Surgeon: Lemar Lofty., MD;  Location: [facility];  Service: Gastroenterology;;   HEMOSTASIS CLIP PLACEMENT  10/11/2021   Procedure: HEMOSTASIS CLIP PLACEMENT;  Surgeon: Lemar Lofty., MD;  Location: [facility];  Service: Gastroenterology;;   NO PAST SURGERIES     POLYPECTOMY  10/11/2021   Procedure: POLYPECTOMY;  Surgeon: Lemar Lofty., MD;  Location: [facility];  Service: Gastroenterology;;   SUBMUCOSAL LIFTING INJECTION  09/21/2020   Procedure: SUBMUCOSAL LIFTING INJECTION;  Surgeon: Lemar Lofty., MD;  Location: [facility];  Service: Gastroenterology;;   SUBMUCOSAL LIFTING INJECTION  10/11/2021   Procedure: SUBMUCOSAL LIFTING INJECTION;  Surgeon: Lemar Lofty., MD;  Location: [facility];  Service: Gastroenterology;;   SUBMUCOSAL TATTOO INJECTION  10/11/2021   Procedure: SUBMUCOSAL TATTOO INJECTION;  Surgeon: Lemar Lofty., MD;  Location: [facility];  Service: Gastroenterology;;   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   Helicobacter pylori infection 07/22/2020   Esophagitis determined by endoscopy 07/22/2020   History of cerebellar stroke 11/26/2019   Mixed hyperlipidemia 11/26/2019   Positive double stranded  DNA antibody test 04/10/2019   Chronic hepatitis C without hepatic coma (HCC) 12/06/2018   HTN (hypertension) 04/30/2018    PCP: Rema Fendt, NP  REFERRING PROVIDER: [place], [place]  REFERRING DIAG: M25.511 (ICD-10-CM) - Acute pain of right shoulder  THERAPY DIAG:  Muscle weakness (generalized)  Right shoulder pain, unspecified chronicity  Rationale for Evaluation and Treatment: Rehabilitation  ONSET DATE: a bit over a month ago  SUBJECTIVE:                                                                                                                                                                                      SUBJECTIVE STATEMENT: Pt stated she's feeling good today and has maintained compliance with HEP.   PERTINENT HISTORY: cataracts (removed), hep C, depression, GERD, HTN, prediabetes, hx stroke 2021  PAIN:  Are you having pain: 7/10 Location/description: R shoulder, occasionally going down into R hand.  Throbbing. Sometimes N/T in RUE, sometimes has some UT pain. Best-worst over past week: 4-10/10  - aggravating factors: reaching overhead, laying on shoulder - Easing factors: N/A    PRECAUTIONS: none  WEIGHT BEARING RESTRICTIONS: No  FALLS:  Has patient fallen in last 6 months? 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 ~6hours day.   PLOF: Independent  PATIENT GOALS: get stronger, understand pain better  NEXT MD VISIT: TBD per pt report  OBJECTIVE:  Note: Objective measures were completed at Evaluation unless otherwise noted.  DIAGNOSTIC FINDINGS:  R shoulder XR 02/02/23: "IMPRESSION: Acromioclavicular degenerative changes. 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, no recent changes or vision issues  POSTURE: Rounded shoulders, increased kyphosis  UPPER EXTREMITY ROM:  A/PROM Right eval Left eval  Shoulder flexion 104 deg * 144 deg  Shoulder abduction 99 deg * 125 deg  Shoulder internal rotation    Shoulder external rotation    Elbow flexion    Elbow extension    Wrist flexion    Wrist extension     (Blank rows = not  tested) (Key: WFL = within functional limits not formally assessed, * = concordant pain, s = stiffness/stretching sensation, NT = not tested)  Comments:    UPPER EXTREMITY MMT:  MMT Right eval Left eval  Shoulder flexion 3+ * 5  Shoulder extension    Shoulder abduction 3+ * 5  Shoulder extension    Shoulder internal rotation 4  4+  Shoulder external rotation 4  4+  Elbow flexion    Elbow extension    Grip strength 30# 40#  (Blank rows = not tested)  (Key: WFL = within functional limits not formally assessed, * = concordant pain, s = stiffness/stretching sensation, NT = not tested)  Comments:   SHOULDER SPECIAL TESTS: deferred  JOINT MOBILITY TESTING:  deferred  PALPATION:  Significant TTP about shoulder girdle, particularly R deltoid, UT, LS, infraspinatus/teres, bicipital groove. RUE numbness provoked by palpation of infraspinatus/teres  [facility] Adult PT Treatment:                                                DATE: 04/05/2023  Therapeutic Exercise: UBE 4' ER isometric w/flex ROM, green band, 3x8  Finger climb w/ABD ecc at top 2x10 Pec stretch 2x1' Upper trap stretch 2x2' Manual Therapy: Upper trap, supraspinatus release Neuromuscular re-education: PNF D2 flexion 2x8 w/2s hold, red band PND D2 Extension 2x8 w/2s hold, red band    [facility] Adult PT Treatment:                                                DATE: 03/23/2023 Therapeutic Exercise: UBE 5' alternate  fwd/bkwd every 1' Finger board climb 2x12 (Pt states being too sore to continue therapy, refused rest of session). Manual [place], upper trap, supraspinatus release  [facility] Adult PT Treatment:                                                DATE: 03/21/2023 Therapeutic Exercise: UE ranger 3x12 in scaption Banded ER isometric through shoulder flexion ROM, 3x6 5s hold yellow Keep elbows at 90 scapula retracted, and neutral/slight ER rotation, flex shoulders until pain is noted, or until 100 degrees is reached. Banded abduction, Full ROM. 3x10 yellow Banded Scapular retraction, 2x8 green Prone retracted Ys w/DB, 2x8 #BW Thumbs up   PATIENT EDUCATION: Education details: Pt education on PT impairments, prognosis, and POC. Informed consent. Rationale for interventions, safe/appropriate HEP performance Person educated: Patient Education method: Explanation, Demonstration, Tactile cues, Verbal cues Education comprehension: verbalized understanding, returned demonstration, verbal cues required, tactile cues required, and needs further education    HOME EXERCISE PROGRAM: Access Code: [email or web address] URL: [email or web address] Date: 03/06/2023 Prepared by: Fransisco Hertz  Exercises - Seated Scapular Retraction  - 2-3 x daily - 1 sets - 8 reps - Seated Shoulder Flexion Towel Slide at Table Top  - 2-3 x daily - 1 sets - 8 reps  ASSESSMENT:  CLINICAL IMPRESSION: Pt attended physical therapy session for continuation of treatment regarding R shoulder pain and dysfunction. Pt showed  great  tolerance to treatment and demonstrated significant improvement with overhead mobility, stability, strength and general ROM.  Pt would be appropriate for re-evaluation in upcoming visit  Pt required minimal vocal/tactile cues for safe and appropriate performance of today's acitivites. Continue with therapeutic focus on end range stability and strength of R shoulder..     Eval impression (03/06/2023):  Patient is a pleasant 63 y.o. woman who was seen today for physical therapy evaluation and treatment for R shoulder pain ongoing for over a month. Pt denies MOI or change in activity but does endorse heavy/repetitive UE use for job. Red flag screening/questioning reassuring overall. On exam pt demonstrates notable R GH mobility limitations and reduction in strength, significant TTP about shoulder girdle. Tolerates HEP well with modifications as above, no adverse events, denies any overt increase in pain on departure. Recommend trial of skilled PT to address aforementioned deficits with aim of improving functional tolerance and reducing pain with typical activities. Pt departs today's session in no acute distress, all voiced concerns/questions addressed appropriately from PT perspective.      OBJECTIVE IMPAIRMENTS: decreased activity tolerance, decreased endurance, decreased mobility, decreased ROM, decreased strength, impaired perceived functional ability, impaired sensation, impaired UE functional use, postural dysfunction, and pain.   ACTIVITY LIMITATIONS: carrying, lifting, sleeping, reach over head, and locomotion level  PARTICIPATION LIMITATIONS: meal prep, cleaning, laundry, community activity, and occupation  PERSONAL FACTORS: Age and 3+ comorbidities: hep C, depression, GERD, HTN, prediabetes, hx stroke 2021  are also affecting patient's functional outcome.   REHAB POTENTIAL: Good  CLINICAL DECISION MAKING: Evolving/moderate complexity  EVALUATION COMPLEXITY: Moderate   GOALS: Goals reviewed with patient? Yes  SHORT TERM GOALS: Target date: 04/03/2023  Pt will demonstrate appropriate understanding and performance of initially prescribed HEP in order to facilitate improved independence with management of symptoms.  Baseline: HEP provided on eval Goal status: INITIAL   2. Pt will score greater than or equal to 57 on FOTO in order to demonstrate improved perception of function due to  symptoms.  Baseline: 53  Goal status: INITIAL    LONG TERM GOALS: Target date: 05/01/2023   Pt will score 61 on FOTO in order to demonstrate improved perception of function due to symptoms. Baseline: 53 Goal status: INITIAL  2.  Pt will demonstrate at least 140 degrees of active shoulder elevation on RUE in order to demonstrate improved tolerance to functional movement patterns such as reaching overhead.  Baseline: see ROM chart above Goal status: INITIAL  3.  Pt will demonstrate at least 4+/5 shoulder flex/abd MMT for improved symmetry of UE strength and improved tolerance to functional movements.  Baseline: see MMT chart above Goal status: INITIAL  4. Pt will demonstrate RUE grip strength at least within 5# of symmetrical in order to facilitate improved functional strength.   Baseline: see MMT chart above  Goal status: INITIAL   5. Pt will demonstrate appropriate performance of final prescribed HEP in order to facilitate improved self-management of symptoms post-discharge.   Baseline: initial HEP prescribed  Goal status: INITIAL    6. Pt will report at least 50% decrease in overall pain levels in past week in order to facilitate improved tolerance to basic ADLs/mobility.   Baseline: 4-10/10  Goal status: INITIAL    PLAN:  PT FREQUENCY: 2x/week  PT DURATION: 8 weeks  PLANNED INTERVENTIONS: [id number]- PT Re-evaluation, 97110-Therapeutic exercises, 97530- Therapeutic activity, 97112- Neuromuscular re-education, 97535- Self Care, [id number]- Manual therapy, Patient/Family education, Balance training, Stair training, Taping, Dry Needling, Joint mobilization, Spinal mobilization, Cryotherapy, and Moist heat  PLAN FOR NEXT SESSION: Review/update HEP PRN. Work on [employer]  exercises as appropriate with emphasis on improving comfort with periscapular/GH muscular contractions. Gentle GH mobility within pt tolerance. Symptom modification strategies as indicated/appropriate.    Sheliah Plane, PT, DPT 03/21/2023, 9:55 AM

## 2023-04-06 ENCOUNTER — Ambulatory Visit: Payer: Medicaid Other

## 2023-04-11 ENCOUNTER — Ambulatory Visit
Admission: RE | Admit: 2023-04-11 | Discharge: 2023-04-11 | Disposition: A | Discharge status: Home / Self Care | Payer: Medicaid Other | Source: Ambulatory Visit | Attending: Family | Admitting: Family

## 2023-04-11 DIAGNOSIS — Z0000 Encounter for general adult medical examination without abnormal findings: Secondary | ICD-10-CM

## 2023-04-11 DIAGNOSIS — Z1231 Encounter for screening mammogram for malignant neoplasm of breast: Secondary | ICD-10-CM | POA: Diagnosis not present

## 2023-04-12 ENCOUNTER — Ambulatory Visit: Payer: Medicaid Other | Admitting: Physical Therapy

## 2023-04-18 ENCOUNTER — Ambulatory Visit (INDEPENDENT_AMBULATORY_CARE_PROVIDER_SITE_OTHER): Payer: Medicaid Other | Admitting: Family

## 2023-04-18 ENCOUNTER — Ambulatory Visit: Payer: Medicaid Other | Admitting: Family

## 2023-04-18 VITALS — BP 118/74 | HR 62 | Temp 97.4°F | Ht 60.0 in | Wt 142.0 lb

## 2023-04-18 DIAGNOSIS — Z23 Encounter for immunization: Secondary | ICD-10-CM

## 2023-04-18 DIAGNOSIS — E785 Hyperlipidemia, unspecified: Secondary | ICD-10-CM | POA: Diagnosis not present

## 2023-04-18 DIAGNOSIS — M25511 Pain in right shoulder: Secondary | ICD-10-CM | POA: Diagnosis not present

## 2023-04-18 DIAGNOSIS — I10 Essential (primary) hypertension: Secondary | ICD-10-CM

## 2023-04-18 MED ORDER — SIMVASTATIN 40 MG PO TABS: 40.0000 mg | ORAL_TABLET | Freq: Every day | ORAL | 0 refills | Status: DC

## 2023-04-18 MED ORDER — LOSARTAN POTASSIUM 50 MG PO TABS: 50.0000 mg | ORAL_TABLET | Freq: Every day | ORAL | 0 refills | Status: DC

## 2023-04-18 MED ORDER — METOPROLOL TARTRATE 25 MG PO TABS: ORAL_TABLET | ORAL | 0 refills | Status: DC

## 2023-04-18 MED ORDER — GABAPENTIN 300 MG PO CAPS: 300.0000 mg | ORAL_CAPSULE | Freq: Every day | ORAL | 0 refills | Status: DC

## 2023-04-18 MED ORDER — AMLODIPINE BESYLATE 10 MG PO TABS: 10.0000 mg | ORAL_TABLET | Freq: Every day | ORAL | 0 refills | Status: DC

## 2023-04-18 NOTE — Progress Notes (Signed)
Patient ID: Katherine Henson, female    DOB: June 27, 1960  MRN: [id number]  CC: Chronic Conditions Follow-Up  Subjective: Katherine Henson is a 63 y.o. female who presents for chronic conditions follow-up.   Her concerns today include:  - Doing well on Amlodipine, Losartan, and Metoprolol Tartrate, no issues/concerns. She does not complain of red flag symptoms such as but not limited to chest pain, shortness of breath, worst headache of life, nausea/vomiting.  - Doing well on Simvastatin, no issues/concerns.  - Doing well on Gabapentin, no issues/concerns. - States she missed an appointment with Physical Therapy and needs new referral.    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 30 tablet 0   Azelastine-Fluticasone (DYMISTA) 137-50 MCG/ACT SUSP Place 1 spray into the nose every 12 (twelve) hours. 23 g 2   Bismuth/Metronidaz/Tetracyclin (PYLERA) 140-125-125 MG CAPS Take 3 capsules by mouth 4 (four) times daily for 10 days. 120 capsule 0   cetirizine (ZYRTEC ALLERGY) 10 MG tablet Take 1 tablet (10 mg total) by mouth at bedtime. 90 tablet 1   losartan (COZAAR) 50 MG tablet Take 50 mg by mouth daily.     pantoprazole (PROTONIX) 40 MG tablet Take 1 tablet (40 mg  total) by mouth 2 (two) times daily. 180 tablet 3   promethazine-dextromethorphan (PROMETHAZINE-DM) 6.25-15 MG/5ML syrup Take 5 mLs by mouth 3 (three) times daily as needed for cough. 240 mL 0   No current facility-administered medications on file prior to visit.    Allergies  Allergen Reactions   Penicillins Anaphylaxis    Did it involve swelling of the face/tongue/throat, SOB, or low BP? No Did it involve sudden or severe rash/hives, skin peeling, or any reaction on the inside of your mouth or nose? Yes Did you need to seek medical attention at a hospital or doctor's office? No When did it last happen?  30 years ago     If all above answers are "NO", may proceed with cephalosporin use.    Social History   Socioeconomic History   Marital status: Single    Spouse name: Not on file   Number of children: 5   Years of education: Not on file   Highest education level: Not on file  Occupational History   Occupation: housekeeper  Tobacco Use   Smoking status: Former    Current packs/day: 0.00    Average packs/day: 0.5 packs/day for 30.0 years (15.0 ttl pk-yrs)    Types: Cigarettes    Start date: 12/05/1980    Quit date: 12/06/2010    Years since quitting: 12.3    Passive exposure: Past   Smokeless tobacco: Never   Tobacco comments:    Quit again 2021  Vaping Use   Vaping status: Never Used  Substance and Sexual Activity   Alcohol  use: Yes    Alcohol/week: 1.0 standard drink of alcohol    Types: 1 Glasses of wine per week    Comment: occasional   Drug use: Not Currently    Types: Marijuana   Sexual activity: Not Currently  Other Topics Concern   Not on file  Social History Narrative   Left handed   One story home   No caffeine   Social Drivers of Health   Financial Resource Strain: Medium Risk (04/18/2023)   Overall Financial Resource Strain (CARDIA)    Difficulty of Paying Living Expenses: Somewhat hard  Food Insecurity: Food Insecurity Present (02/03/2021)    Hunger Vital Sign    Worried About Running Out of Food in the Last Year: Often true    Ran Out of Food in the Last Year: Sometimes true  Transportation Needs: Unmet Transportation Needs (02/03/2021)   PRAPARE - Transportation    Lack of Transportation (Medical): Yes    Lack of Transportation (Non-Medical): Yes  Physical Activity: Sufficiently Active (04/18/2023)   Exercise Vital Sign    Days of Exercise per Week: 7 days    Minutes of Exercise per Session: 40 min  Stress: No Stress Concern Present (04/18/2023)   [employer] of Occupational Health - Occupational Stress Questionnaire    Feeling of Stress : Not at all  Social Connections: Moderately Integrated (04/18/2023)   Social Connection and Isolation Panel [NHANES]    Frequency of Communication with Friends and Family: More than three times a week    Frequency of Social Gatherings with Friends and Family: Three times a week    Attends Religious Services: More than 4 times per year    Active Member of Clubs or Organizations: Yes    Attends Engineer, structural: More than 4 times per year    Marital Status: Never married  Intimate Partner Violence: Not on file    Family History  Problem Relation Age of Onset   Stroke Mother    Anuerysm Mother        brain   Diabetes Sister    Breast cancer Sister    Breast cancer Sister    Diabetes Brother    Prostate cancer Brother    Breast cancer Niece    Breast cancer Niece    Colon cancer Neg Hx    Esophageal cancer Neg Hx    Liver disease Neg Hx    Stomach cancer Neg Hx    Pancreatic cancer Neg Hx    Inflammatory bowel disease Neg Hx    Rectal cancer Neg Hx    Colon polyps Neg Hx    Crohn's disease Neg Hx    Ulcerative colitis Neg Hx     Past Surgical History:  Procedure Laterality Date   BIOPSY  01/26/2023   Procedure: BIOPSY;  Surgeon: Meridee Score, Netty Starring., MD;  Location: [facility];  Service: Gastroenterology;;   COLONOSCOPY     ENDOSCOPIC MUCOSAL  RESECTION  09/21/2020   Procedure: ENDOSCOPIC MUCOSAL RESECTION;  Surgeon: Lemar Lofty., MD;  Location: [facility];  Service: Gastroenterology;;   ENDOSCOPIC MUCOSAL RESECTION N/A 10/11/2021   Procedure: ENDOSCOPIC MUCOSAL RESECTION;  Surgeon: Lemar Lofty., MD;  Location: [facility];  Service: Gastroenterology;  Laterality: N/A;   ESOPHAGOGASTRODUODENOSCOPY (EGD) WITH PROPOFOL N/A 09/21/2020   Procedure: ESOPHAGOGASTRODUODENOSCOPY (EGD) WITH PROPOFOL;  Surgeon: Meridee Score Netty Starring., MD;  Location: [facility];  Service: Gastroenterology;  Laterality: N/A;   ESOPHAGOGASTRODUODENOSCOPY (EGD) WITH PROPOFOL N/A 10/11/2021   Procedure: ESOPHAGOGASTRODUODENOSCOPY (EGD) WITH  PROPOFOL;  Surgeon: Lemar Lofty., MD;  Location: [facility];  Service: Gastroenterology;  Laterality: N/A;   ESOPHAGOGASTRODUODENOSCOPY (EGD) WITH PROPOFOL N/A 01/26/2023   Procedure: ESOPHAGOGASTRODUODENOSCOPY (EGD) WITH PROPOFOL;  Surgeon: Meridee Score Netty Starring., MD;  Location: [facility];  Service: Gastroenterology;  Laterality: N/A;   HEMOSTASIS CLIP PLACEMENT  09/21/2020   Procedure: HEMOSTASIS CLIP PLACEMENT;  Surgeon: Lemar Lofty., MD;  Location: [facility];  Service: Gastroenterology;;   HEMOSTASIS CLIP PLACEMENT  10/11/2021   Procedure: HEMOSTASIS CLIP PLACEMENT;  Surgeon: Lemar Lofty., MD;  Location: [facility];  Service: Gastroenterology;;   NO PAST SURGERIES     POLYPECTOMY  10/11/2021   Procedure: POLYPECTOMY;  Surgeon: Lemar Lofty., MD;  Location: [facility];  Service: Gastroenterology;;   SUBMUCOSAL LIFTING INJECTION  09/21/2020   Procedure: SUBMUCOSAL LIFTING INJECTION;  Surgeon: Lemar Lofty., MD;  Location: [facility];  Service: Gastroenterology;;   SUBMUCOSAL LIFTING INJECTION  10/11/2021   Procedure: SUBMUCOSAL LIFTING INJECTION;  Surgeon: Lemar Lofty., MD;  Location: [facility];  Service:  Gastroenterology;;   SUBMUCOSAL TATTOO INJECTION  10/11/2021   Procedure: SUBMUCOSAL TATTOO INJECTION;  Surgeon: Lemar Lofty., MD;  Location: [facility];  Service: Gastroenterology;;    ROS: Review of Systems Negative except as stated above  PHYSICAL EXAM: BP 118/74   Pulse 62   Temp (!) 97.4 F (36.3 C) (Oral)   Ht 5' (1.524 m)   Wt 142 lb (64.4 kg)   LMP 09/27/2012   SpO2 98%   BMI 27.73 kg/m   Physical Exam HENT:     Head: Normocephalic and atraumatic.     Nose: Nose normal.     Mouth/Throat:     Mouth: Mucous membranes are moist.     Pharynx: Oropharynx is clear.  Eyes:     Extraocular Movements: Extraocular movements intact.     Conjunctiva/sclera: Conjunctivae normal.     Pupils: Pupils are equal, round, and reactive to light.  Cardiovascular:     Rate and Rhythm: Normal rate and regular rhythm.     Pulses: Normal pulses.     Heart sounds: Normal heart sounds.  Pulmonary:     Effort: Pulmonary effort is normal.     Breath sounds: Normal breath sounds.  Musculoskeletal:        General: Normal range of motion.     Cervical back: Normal range of motion and neck supple.  Neurological:     General: No focal deficit present.     Mental Status: She is alert and oriented to person, place, and time.  Psychiatric:        Mood and Affect: Mood normal.        Behavior: Behavior normal.     ASSESSMENT AND PLAN: 1. Primary hypertension (Primary) - Continue Amlodipine, Losartan, and Metoprolol Tartrate as prescribed.  - Routine screening.  - Counseled on blood pressure goal of less than 130/80, low-sodium, DASH diet, medication compliance, and 150 minutes of moderate intensity exercise per week as tolerated. Counseled on medication adherence and adverse effects. - Follow-up with primary provider in 3 months or sooner if needed.  - amLODipine (NORVASC) 10 MG tablet; Take 1 tablet (10 mg total) by mouth daily.  Dispense: 90 tablet; Refill: 0 - metoprolol  tartrate (LOPRESSOR) 25 MG tablet; Take 1/2 (one-half) tablet by mouth twice daily  Dispense: 90 tablet; Refill: 0 - losartan (COZAAR) 50 MG tablet; Take 1 tablet (50 mg total) by mouth daily.  Dispense: 90 tablet; Refill: 0 -  Basic Metabolic Panel  2. Hyperlipidemia, unspecified hyperlipidemia type - Continue Simvastatin as prescribed. Counseled on medication adherence/adverse effects.  - Follow-up with primary provider in 3 months or sooner if needed. - simvastatin (ZOCOR) 40 MG tablet; Take 1 tablet (40 mg total) by mouth daily at 6 PM.  Dispense: 90 tablet; Refill: 0  3. Right shoulder pain, unspecified chronicity - Continue Gabapentin as prescribed. Counseled on medication adherence/adverse effects. - Referral to Physical Therapy for evaluation/management. - Follow-up with primary provider as scheduled. - Ambulatory referral to Physical Therapy - gabapentin (NEURONTIN) 300 MG capsule; Take 1 capsule (300 mg total) by mouth at bedtime.  Dispense: 90 capsule; Refill: 0   Patient was given the opportunity to ask questions.  Patient verbalized understanding of the plan and was able to repeat key elements of the plan. Patient was given clear instructions to go to Emergency Department or return to medical center if symptoms don't improve, worsen, or new problems develop.The patient verbalized understanding.   Orders Placed This Encounter  Procedures   Basic Metabolic Panel   Ambulatory referral to Physical Therapy     Requested Prescriptions   Signed Prescriptions Disp Refills   amLODipine (NORVASC) 10 MG tablet 90 tablet 0    Sig: Take 1 tablet (10 mg total) by mouth daily.   metoprolol tartrate (LOPRESSOR) 25 MG tablet 90 tablet 0    Sig: Take 1/2 (one-half) tablet by mouth twice daily   simvastatin (ZOCOR) 40 MG tablet 90 tablet 0    Sig: Take 1 tablet (40 mg total) by mouth daily at 6 PM.   losartan (COZAAR) 50 MG tablet 90 tablet 0    Sig: Take 1 tablet (50 mg total) by mouth  daily.   gabapentin (NEURONTIN) 300 MG capsule 90 capsule 0    Sig: Take 1 capsule (300 mg total) by mouth at bedtime.    Return in about 3 months (around 07/16/2023) for Follow-Up or next available chronic conditions.  Rema Fendt, NP

## 2023-04-18 NOTE — Progress Notes (Signed)
Patient states she needs a referral for physical therapy.   States pain in middle part of her back.   Will get shingles if it wont effect her.

## 2023-04-18 NOTE — Addendum Note (Signed)
Addended by: Claudie Leach on: 04/18/2023 01:41 PM   Modules accepted: Orders

## 2023-04-19 ENCOUNTER — Encounter: Payer: Medicaid Other | Admitting: Physical Therapy

## 2023-04-19 LAB — BASIC METABOLIC PANEL
BUN/Creatinine Ratio: 16 (ref 12–28)
BUN: 13 mg/dL (ref 8–27)
CO2: 21 mmol/L (ref 20–29)
Calcium: 9.7 mg/dL (ref 8.7–10.3)
Chloride: 109 mmol/L — ABNORMAL HIGH (ref 96–106)
Creatinine, Ser: 0.81 mg/dL (ref 0.57–1.00)
Glucose: 69 mg/dL — ABNORMAL LOW (ref 70–99)
Potassium: 4.5 mmol/L (ref 3.5–5.2)
Sodium: 146 mmol/L — ABNORMAL HIGH (ref 134–144)
eGFR: 82 mL/min/{1.73_m2} (ref >59)

## 2023-05-01 NOTE — Therapy (Signed)
 OUTPATIENT PHYSICAL THERAPY UPPER EXTREMITY EVALUATION   Patient Name: Katherine Henson MRN: [id number] DOB:11/13/1960, 63 y.o., female Today's Date: 05/02/2023  END OF SESSION:  PT End of Session - 05/02/23 1425     Visit Number 1    Number of Visits 13    Date for PT Re-Evaluation 06/13/23    Authorization Type [employer]    Authorization Time Period auth tbd    PT Start Time 1430    PT Stop Time 1500    PT Time Calculation (min) 30 min             Past Medical History:  Diagnosis Date   Allergy    Arthritis    Cataract    bilateral removed   Chronic hepatitis C (HCC) 12/06/2018   treated for   Depression    GERD (gastroesophageal reflux disease)    HTN (hypertension)    Hyperlipidemia    Pre-diabetes    Stroke [facility])    mini stroke 2021   Past Surgical History:  Procedure Laterality Date   BIOPSY  01/26/2023   Procedure: BIOPSY;  Surgeon: Lemar Lofty., MD;  Location: [facility];  Service: Gastroenterology;;   COLONOSCOPY     ENDOSCOPIC MUCOSAL RESECTION  09/21/2020   Procedure: ENDOSCOPIC MUCOSAL RESECTION;  Surgeon: Lemar Lofty., MD;  Location: [facility];  Service: Gastroenterology;;   ENDOSCOPIC MUCOSAL RESECTION N/A 10/11/2021   Procedure: ENDOSCOPIC MUCOSAL RESECTION;  Surgeon: Lemar Lofty., MD;  Location: [facility];  Service: Gastroenterology;  Laterality: N/A;   ESOPHAGOGASTRODUODENOSCOPY (EGD) WITH PROPOFOL N/A 09/21/2020   Procedure: ESOPHAGOGASTRODUODENOSCOPY (EGD) WITH PROPOFOL;  Surgeon: Meridee Score Netty Starring., MD;  Location: [facility];  Service: Gastroenterology;  Laterality: N/A;   ESOPHAGOGASTRODUODENOSCOPY (EGD) WITH PROPOFOL N/A 10/11/2021   Procedure: ESOPHAGOGASTRODUODENOSCOPY (EGD) WITH PROPOFOL;  Surgeon: Meridee Score Netty Starring., MD;  Location: [facility];  Service: Gastroenterology;  Laterality: N/A;   ESOPHAGOGASTRODUODENOSCOPY (EGD) WITH PROPOFOL N/A 01/26/2023   Procedure:  ESOPHAGOGASTRODUODENOSCOPY (EGD) WITH PROPOFOL;  Surgeon: Meridee Score Netty Starring., MD;  Location: [facility];  Service: Gastroenterology;  Laterality: N/A;   HEMOSTASIS CLIP PLACEMENT  09/21/2020   Procedure: HEMOSTASIS CLIP PLACEMENT;  Surgeon: Lemar Lofty., MD;  Location: [facility];  Service: Gastroenterology;;   HEMOSTASIS CLIP PLACEMENT  10/11/2021   Procedure: HEMOSTASIS CLIP PLACEMENT;  Surgeon: Lemar Lofty., MD;  Location: [facility];  Service: Gastroenterology;;   NO PAST SURGERIES     POLYPECTOMY  10/11/2021   Procedure: POLYPECTOMY;  Surgeon: Lemar Lofty., MD;  Location: [facility];  Service: Gastroenterology;;   SUBMUCOSAL LIFTING INJECTION  09/21/2020   Procedure: SUBMUCOSAL LIFTING INJECTION;  Surgeon: Lemar Lofty., MD;  Location: [facility];  Service: Gastroenterology;;   SUBMUCOSAL LIFTING INJECTION  10/11/2021   Procedure: SUBMUCOSAL LIFTING INJECTION;  Surgeon: Lemar Lofty., MD;  Location: [facility];  Service: Gastroenterology;;   SUBMUCOSAL TATTOO INJECTION  10/11/2021   Procedure: SUBMUCOSAL TATTOO INJECTION;  Surgeon: Lemar Lofty., MD;  Location: [facility];  Service: Gastroenterology;;   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   Helicobacter pylori infection 07/22/2020   Esophagitis determined by endoscopy 07/22/2020   History of cerebellar stroke 11/26/2019   Mixed hyperlipidemia 11/26/2019   Positive double stranded DNA antibody test 04/10/2019  Chronic hepatitis C without hepatic coma (HCC) 12/06/2018   HTN (hypertension) 04/30/2018    PCP: Rema Fendt, NP  REFERRING PROVIDER: Rema Fendt, NP  REFERRING DIAG: Right  shoulder pain, unspecified chronicity [M25.511]   THERAPY DIAG:  Right shoulder pain, unspecified chronicity  Rationale for Evaluation and Treatment: Rehabilitation  ONSET DATE: December 2023  SUBJECTIVE:                                                                                                                                                                                      SUBJECTIVE STATEMENT: Eval statement 05/02/2023: pain has been manageable, its not as bad as it was. No true difficulties at home any more. No pain sitting still, only pain when moving fast a certain way, upwards of a 5 or 6/10 Hand dominance: Left  PERTINENT HISTORY: cataracts (removed), hep C, depression, GERD, HTN, prediabetes, hx stroke 2021   PAIN:  Are you having pain? Yes, pain in anterior shoulder with movement 6/10 at its worse currently.  PRECAUTIONS: None  RED FLAGS: None   WEIGHT BEARING RESTRICTIONS: No  FALLS:  Has patient fallen in last 6 months? No  LIVING ENVIRONMENT: Lives with: lives with their family Lives in: House/apartment Stairs: No Has following equipment at home: None  OCCUPATION: Works at [employer] part time  PLOF: Independent  PATIENT GOALS: be able to move the shoulder better without causing more pain.  NEXT MD VISIT: May 2024  OBJECTIVE:  Note: Objective measures were completed at Evaluation unless otherwise noted.  DIAGNOSTIC FINDINGS:  R shoulder XR 02/02/23: "IMPRESSION: Acromioclavicular degenerative changes. No acute osseous abnormalities."  PATIENT SURVEYS :  Quick Dash 67 ( 30.83%)  COGNITION: Overall cognitive status: Within functional limits for tasks assessed     SENSATION: Tingling into median nerve innervation with resisted shoulder abduction   POSTURE: Increased kyphosis in thoracic spine  UPPER EXTREMITY ROM:   Active ROM Right eval Left eval  Shoulder flexion 180 180  Shoulder extension 60 60  Shoulder abduction    Shoulder  adduction    Shoulder internal rotation t10 t10  Shoulder external rotation c7 c7  Elbow flexion    Elbow extension    Wrist flexion    Wrist extension    Wrist ulnar deviation    Wrist radial deviation    Wrist pronation    Wrist supination    (Blank rows = not tested)  UPPER EXTREMITY MMT:  MMT Right eval Left eval  Shoulder flexion 4- 4  Shoulder extension 4- 4  Shoulder abduction 3+ 5  Shoulder adduction    Shoulder internal rotation    Shoulder external rotation  3+ 4  Middle trapezius    Lower trapezius    Elbow flexion    Elbow extension    Wrist flexion    Wrist extension    Wrist ulnar deviation    Wrist radial deviation    Wrist pronation    Wrist supination    Grip strength (lbs)    (Blank rows = not tested)  SHOULDER SPECIAL TESTS: Impingement tests: Painful arc test: negative  JOINT MOBILITY TESTING:  NI  PALPATION:  Tenderness to upper trap, scalenes, and coracoid process of R shoulder                                             [facility] Adult PT Treatment:                                                DATE: 05/02/2023 Self Care: Pt education, detailed below POC discussion   PATIENT EDUCATION: Education details: Pt received education regarding HEP performance, ADL performance, functional activity tolerance, impairment education, appropriate performance of therapeutic activities. Person educated: Patient Education method: Explanation, Demonstration, Tactile cues, Verbal cues, and Handouts Education comprehension: verbalized understanding, returned demonstration, and verbal cues required  HOME EXERCISE PROGRAM: Access Code: [email or web address] URL: [email or web address] Date: 05/02/2023 Prepared by: Sheliah Plane  Exercises - Serratus Activation at Wall  - 1 x daily - 7 x weekly - 2-3 sets - 6 reps - 6s hold - Median Nerve Flossing - Tray  - 1 x daily - 7 x weekly - 2 sets - 20 reps - 1s hold - Prone Scapular Retraction Arms at Side  - 1 x  daily - 7 x weekly - 2-3 sets - 10 reps - 3s hold - Prone Scapular Retraction Y  - 1 x daily - 7 x weekly - 2-3 sets - 10 reps - 3s hold - Standing Shoulder Abduction with Resistance  - 1 x daily - 7 x weekly - 2-3 sets - 8-10 reps - 3s hold  ASSESSMENT:  CLINICAL IMPRESSION: Eval impression (05/02/2023): Pt. attended today's physical therapy session for evaluation of R shoulder pain. Pt has complaints of R shoulder pain, lack of function with daily activities, and occasional tingling into fingers 2-4. Pt has notable deficits with quality of motion in R,  posture, global shoulder weakness with specific impairment in shoulder abduction and median nerve motility. Pt would benefit from therapeutic focus on global R shoulder strengthening, superior force couple motor recruitment during overhead activity, median nerve motility, and shoulder girdle stability.  Treatment performed today focused on patient education detailed in objective Pt demonstrated great understanding of education provided. required minimal cues and no assistance for appropriate performance with today's activities.  Pt requires the intervention of skilled outpatient physical therapy to address the aforementioned deficits and progress towards a functional level in line with therapeutic goals.    OBJECTIVE IMPAIRMENTS: decreased ROM, decreased strength, impaired sensation, impaired UE functional use, improper body mechanics, postural dysfunction, and pain.   ACTIVITY LIMITATIONS: carrying, lifting, reach over head, and caring for others  PARTICIPATION LIMITATIONS: meal prep, cleaning, laundry, shopping, community activity, and occupation  PERSONAL FACTORS: Behavior pattern and Fitness are also affecting patient's functional outcome.   REHAB POTENTIAL: Good  CLINICAL DECISION MAKING:  Stable/uncomplicated  EVALUATION COMPLEXITY: Low  GOALS: Goals reviewed with patient? Yes  SHORT TERM GOALS: Target date: 05/23/2023    Pt will  be independent with administered HEP to demonstrate the competency necessary for long term managemnet of symptoms at home.  Baseline:[email or web address] Goal status: INITIAL   LONG TERM GOALS: Target date: 06/13/2023  Pt. Will achieve a DASH score of 55 as to demonstrate improvement in self-perceived functional ability with daily activities.  Baseline: 67 (30.83%) Goal status: INITIAL  2.  Pt will report pain levels improving during ADLs to be less than or equal to 3/10 as to demonstrate improved tolerance with daily functional activities such as prepping food at [employer], and reaching overhead to put a dish away.  Baseline: 6/10 Goal status: INITIAL  3.  Pt will improve MMT score for R shoulder Abduction to a 4+/5 to demonstrate improvement in strength for quality of motion and activity performance.  Baseline: see obj chart Goal status: INITIAL  4.  Pt will perform 160 degrees of R shoulder Abduction with no reports of numbness or tingling into fingers 2-4 as to demonstrate improved median nerve motility. Baseline: 120d w/symptoms  Goal status: INITIAL  PLAN: PT FREQUENCY: 1-2x/week  PT DURATION: 6 weeks  PLANNED INTERVENTIONS: 97110-Therapeutic exercises, 97530- Therapeutic activity, 97112- Neuromuscular re-education, 97535- Self Care, [id number]- Manual therapy, [phone]- Electrical stimulation (manual), Patient/Family education, Dry Needling, Joint mobilization, Spinal mobilization, and Moist heat  PLAN FOR NEXT SESSION: Review HEP, Begin POC as detailed in assessment  Sheliah Plane, PT, DPT 05/02/2023, 3:11 PM  For all possible CPT codes, reference the Planned Interventions line above.     Check all conditions that are expected to impact treatment: {Conditions expected to impact treatment:None of these apply   If treatment provided at initial evaluation, no treatment charged due to lack of authorization.

## 2023-05-02 ENCOUNTER — Other Ambulatory Visit: Payer: Self-pay

## 2023-05-02 ENCOUNTER — Encounter: Payer: Self-pay | Admitting: Physical Therapy

## 2023-05-02 ENCOUNTER — Ambulatory Visit: Payer: Medicaid Other | Attending: Family | Admitting: Physical Therapy

## 2023-05-02 DIAGNOSIS — M25511 Pain in right shoulder: Secondary | ICD-10-CM | POA: Insufficient documentation

## 2023-05-02 DIAGNOSIS — M6281 Muscle weakness (generalized): Secondary | ICD-10-CM | POA: Insufficient documentation

## 2023-05-08 NOTE — Therapy (Unsigned)
 OUTPATIENT PHYSICAL THERAPY UPPER EXTREMITY EVALUATION   Patient Name: Katherine Henson [place] MRN: [id number] DOB:Jul 30, 1960, 63 y.Henson., female Today's Date: 05/08/2023  END OF SESSION:    Past Medical History:  Diagnosis Date   Allergy    Arthritis    Cataract    bilateral removed   Chronic hepatitis C (HCC) 12/06/2018   treated for   Depression    GERD (gastroesophageal reflux disease)    HTN (hypertension)    Hyperlipidemia    Pre-diabetes    Stroke [facility])    mini stroke 2021   Past Surgical History:  Procedure Laterality Date   BIOPSY  01/26/2023   Procedure: BIOPSY;  Surgeon: Lemar Lofty., MD;  Location: [facility];  Service: Gastroenterology;;   COLONOSCOPY     ENDOSCOPIC MUCOSAL RESECTION  09/21/2020   Procedure: ENDOSCOPIC MUCOSAL RESECTION;  Surgeon: Lemar Lofty., MD;  Location: [facility];  Service: Gastroenterology;;   ENDOSCOPIC MUCOSAL RESECTION N/A 10/11/2021   Procedure: ENDOSCOPIC MUCOSAL RESECTION;  Surgeon: Lemar Lofty., MD;  Location: [facility];  Service: Gastroenterology;  Laterality: N/A;   ESOPHAGOGASTRODUODENOSCOPY (EGD) WITH PROPOFOL N/A 09/21/2020   Procedure: ESOPHAGOGASTRODUODENOSCOPY (EGD) WITH PROPOFOL;  Surgeon: Meridee Score Netty Starring., MD;  Location: [facility];  Service: Gastroenterology;  Laterality: N/A;   ESOPHAGOGASTRODUODENOSCOPY (EGD) WITH PROPOFOL N/A 10/11/2021   Procedure: ESOPHAGOGASTRODUODENOSCOPY (EGD) WITH PROPOFOL;  Surgeon: Meridee Score Netty Starring., MD;  Location: [facility];  Service: Gastroenterology;  Laterality: N/A;   ESOPHAGOGASTRODUODENOSCOPY (EGD) WITH PROPOFOL N/A 01/26/2023   Procedure: ESOPHAGOGASTRODUODENOSCOPY (EGD) WITH PROPOFOL;  Surgeon: Meridee Score Netty Starring., MD;  Location: [facility];  Service: Gastroenterology;  Laterality: N/A;   HEMOSTASIS CLIP PLACEMENT  09/21/2020   Procedure: HEMOSTASIS CLIP PLACEMENT;  Surgeon: Lemar Lofty., MD;  Location: [facility];   Service: Gastroenterology;;   HEMOSTASIS CLIP PLACEMENT  10/11/2021   Procedure: HEMOSTASIS CLIP PLACEMENT;  Surgeon: Lemar Lofty., MD;  Location: [facility];  Service: Gastroenterology;;   NO PAST SURGERIES     POLYPECTOMY  10/11/2021   Procedure: POLYPECTOMY;  Surgeon: Lemar Lofty., MD;  Location: [facility];  Service: Gastroenterology;;   SUBMUCOSAL LIFTING INJECTION  09/21/2020   Procedure: SUBMUCOSAL LIFTING INJECTION;  Surgeon: Lemar Lofty., MD;  Location: [facility];  Service: Gastroenterology;;   Sunnie Nielsen LIFTING INJECTION  10/11/2021   Procedure: SUBMUCOSAL LIFTING INJECTION;  Surgeon: Lemar Lofty., MD;  Location: [facility];  Service: Gastroenterology;;   SUBMUCOSAL TATTOO INJECTION  10/11/2021   Procedure: SUBMUCOSAL TATTOO INJECTION;  Surgeon: Lemar Lofty., MD;  Location: [facility];  Service: Gastroenterology;;   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  Rationale for Evaluation and Treatment: Rehabilitation  ONSET DATE: December 2023  SUBJECTIVE:  SUBJECTIVE STATEMENT: Eval statement 05/02/2023: pain has been manageable, its not as bad as it was. No true difficulties at home any more. No pain sitting still, only pain when moving fast a certain way, upwards of a 5 or 6/10 Hand dominance: Left  PERTINENT HISTORY: cataracts (removed), hep C, depression, GERD, HTN, prediabetes, hx stroke 2021   PAIN:  Are you having pain? Yes, pain in anterior shoulder with movement 6/10 at its worse currently.  PRECAUTIONS: None  RED FLAGS: None   WEIGHT BEARING RESTRICTIONS: No  FALLS:  Has patient fallen in last 6 months? No  LIVING ENVIRONMENT: Lives with: lives with their family Lives in: House/apartment Stairs: No Has following equipment at home: None  OCCUPATION: Works at [employer] part time  PLOF: Independent  PATIENT GOALS: be able to move the shoulder better without causing more pain.  NEXT MD VISIT: May 2024  OBJECTIVE:  Note: Objective measures were completed at Evaluation unless otherwise noted.  DIAGNOSTIC FINDINGS:  R shoulder XR 02/02/23: "IMPRESSION: Acromioclavicular degenerative changes. No acute osseous abnormalities."  PATIENT SURVEYS :  Quick Dash 67 ( 30.83%)  COGNITION: Overall cognitive status: Within functional limits for tasks assessed     SENSATION: Tingling into median nerve innervation with resisted shoulder abduction   POSTURE: Increased kyphosis in thoracic spine  UPPER EXTREMITY ROM:   Active ROM Right eval Left eval  Shoulder flexion 180 180  Shoulder extension 60 60  Shoulder abduction    Shoulder adduction    Shoulder internal rotation t10 t10  Shoulder external rotation c7 c7  Elbow flexion    Elbow extension    Wrist flexion    Wrist extension    Wrist ulnar deviation    Wrist radial deviation    Wrist pronation    Wrist supination    (Blank rows = not tested)  UPPER EXTREMITY MMT:  MMT Right eval Left eval   Shoulder flexion 4- 4  Shoulder extension 4- 4  Shoulder abduction 3+ 5  Shoulder adduction    Shoulder internal rotation    Shoulder external rotation 3+ 4  Middle trapezius    Lower trapezius    Elbow flexion    Elbow extension    Wrist flexion    Wrist extension    Wrist ulnar deviation    Wrist radial deviation    Wrist pronation    Wrist supination    Grip strength (lbs)    (Blank rows = not tested)  SHOULDER SPECIAL TESTS: Impingement tests: Painful arc test: negative  JOINT MOBILITY TESTING:  NI  PALPATION:  Tenderness to upper trap, scalenes, and coracoid process of R shoulder                                             [facility] Adult PT Treatment:                                                DATE: 05/02/2023 Self Care: Pt education, detailed below POC discussion   PATIENT EDUCATION: Education details: Pt received education regarding HEP performance, ADL performance, functional activity tolerance, impairment education, appropriate performance of therapeutic activities. Person educated: Patient Education method: Explanation, Demonstration, Tactile cues, Verbal cues, and Handouts Education comprehension: verbalized understanding, returned  demonstration, and verbal cues required  HOME EXERCISE PROGRAM: Access Code: [email or web address] URL: [email or web address] Date: 05/02/2023 Prepared by: Sheliah Plane  Exercises - Serratus Activation at Wall  - 1 x daily - 7 x weekly - 2-3 sets - 6 reps - 6s hold - Median Nerve Flossing - Tray  - 1 x daily - 7 x weekly - 2 sets - 20 reps - 1s hold - Prone Scapular Retraction Arms at Side  - 1 x daily - 7 x weekly - 2-3 sets - 10 reps - 3s hold - Prone Scapular Retraction Y  - 1 x daily - 7 x weekly - 2-3 sets - 10 reps - 3s hold - Standing Shoulder Abduction with Resistance  - 1 x daily - 7 x weekly - 2-3 sets - 8-10 reps - 3s hold  ASSESSMENT:  CLINICAL IMPRESSION: Eval impression (05/02/2023): Pt. attended today's  physical therapy session for evaluation of R shoulder pain. Pt has complaints of R shoulder pain, lack of function with daily activities, and occasional tingling into fingers 2-4. Pt has notable deficits with quality of motion in R,  posture, global shoulder weakness with specific impairment in shoulder abduction and median nerve motility. Pt would benefit from therapeutic focus on global R shoulder strengthening, superior force couple motor recruitment during overhead activity, median nerve motility, and shoulder girdle stability.  Treatment performed today focused on patient education detailed in objective Pt demonstrated great understanding of education provided. required minimal cues and no assistance for appropriate performance with today's activities.  Pt requires the intervention of skilled outpatient physical therapy to address the aforementioned deficits and progress towards a functional level in line with therapeutic goals.    OBJECTIVE IMPAIRMENTS: decreased ROM, decreased strength, impaired sensation, impaired UE functional use, improper body mechanics, postural dysfunction, and pain.   ACTIVITY LIMITATIONS: carrying, lifting, reach over head, and caring for others  PARTICIPATION LIMITATIONS: meal prep, cleaning, laundry, shopping, community activity, and occupation  PERSONAL FACTORS: Behavior pattern and Fitness are also affecting patient's functional outcome.   REHAB POTENTIAL: Good  CLINICAL DECISION MAKING: Stable/uncomplicated  EVALUATION COMPLEXITY: Low  GOALS: Goals reviewed with patient? Yes  SHORT TERM GOALS: Target date: 05/23/2023    Pt will be independent with administered HEP to demonstrate the competency necessary for long term managemnet of symptoms at home.  Baseline:[email or web address] Goal status: INITIAL   LONG TERM GOALS: Target date: 06/13/2023  Pt. Will achieve a DASH score of 55 as to demonstrate improvement in self-perceived functional ability with daily  activities.  Baseline: 67 (30.83%) Goal status: INITIAL  2.  Pt will report pain levels improving during ADLs to be less than or equal to 3/10 as to demonstrate improved tolerance with daily functional activities such as prepping food at [employer], and reaching overhead to put a dish away.  Baseline: 6/10 Goal status: INITIAL  3.  Pt will improve MMT score for R shoulder Abduction to a 4+/5 to demonstrate improvement in strength for quality of motion and activity performance.  Baseline: see obj chart Goal status: INITIAL  4.  Pt will perform 160 degrees of R shoulder Abduction with no reports of numbness or tingling into fingers 2-4 as to demonstrate improved median nerve motility. Baseline: 120d w/symptoms  Goal status: INITIAL  PLAN: PT FREQUENCY: 1-2x/week  PT DURATION: 6 weeks  PLANNED INTERVENTIONS: 97110-Therapeutic exercises, 97530- Therapeutic activity, [id number]- Neuromuscular re-education, 97535- Self Care, [id number]- Manual therapy, [phone]- Electrical stimulation (manual), Patient/Family education, Dry Needling, Joint mobilization, Spinal mobilization, and Moist  heat  PLAN FOR NEXT SESSION: Review HEP, Begin POC as detailed in assessment  Sheliah Plane, PT, DPT 05/08/2023, 8:10 AM  For all possible CPT codes, reference the Planned Interventions line above.     Check all conditions that are expected to impact treatment: {Conditions expected to impact treatment:None of these apply   If treatment provided at initial evaluation, no treatment charged due to lack of authorization.

## 2023-05-09 ENCOUNTER — Ambulatory Visit: Payer: Medicaid Other | Attending: Family

## 2023-05-09 DIAGNOSIS — M25511 Pain in right shoulder: Secondary | ICD-10-CM | POA: Insufficient documentation

## 2023-05-09 DIAGNOSIS — M6281 Muscle weakness (generalized): Secondary | ICD-10-CM | POA: Diagnosis not present

## 2023-05-09 NOTE — Therapy (Unsigned)
 OUTPATIENT PHYSICAL THERAPY TREATMENT NOTE   Patient Name: Katherine Henson MRN: [id number] DOB:Mar 25, 1960, 64 y.o., female Today's Date: 05/11/2023  END OF SESSION:  PT End of Session - 05/11/23 1359     Visit Number 3    Number of Visits 13    Date for PT Re-Evaluation 06/13/23    Authorization Type [employer]    Authorization Time Period Approved 5 visits 05/02/23-06/30/23    Authorization - Number of Visits 5    PT Start Time 1400    PT Stop Time 1440    PT Time Calculation (min) 40 min    Activity Tolerance Patient tolerated treatment well    Behavior During Therapy [facility] for tasks assessed/performed               Past Medical History:  Diagnosis Date   Allergy    Arthritis    Cataract    bilateral removed   Chronic hepatitis C (HCC) 12/06/2018   treated for   Depression    GERD (gastroesophageal reflux disease)    HTN (hypertension)    Hyperlipidemia    Pre-diabetes    Stroke [facility])    mini stroke 2021   Past Surgical History:  Procedure Laterality Date   BIOPSY  01/26/2023   Procedure: BIOPSY;  Surgeon: Lemar Lofty., MD;  Location: [facility];  Service: Gastroenterology;;   COLONOSCOPY     ENDOSCOPIC MUCOSAL RESECTION  09/21/2020   Procedure: ENDOSCOPIC MUCOSAL RESECTION;  Surgeon: Lemar Lofty., MD;  Location: [facility];  Service: Gastroenterology;;   ENDOSCOPIC MUCOSAL RESECTION N/A 10/11/2021   Procedure: ENDOSCOPIC MUCOSAL RESECTION;  Surgeon: Lemar Lofty., MD;  Location: [facility];  Service: Gastroenterology;  Laterality: N/A;   ESOPHAGOGASTRODUODENOSCOPY (EGD) WITH PROPOFOL N/A 09/21/2020   Procedure: ESOPHAGOGASTRODUODENOSCOPY (EGD) WITH PROPOFOL;  Surgeon: Meridee Score Netty Starring., MD;  Location: [facility];  Service: Gastroenterology;  Laterality: N/A;   ESOPHAGOGASTRODUODENOSCOPY (EGD) WITH PROPOFOL N/A 10/11/2021   Procedure: ESOPHAGOGASTRODUODENOSCOPY (EGD) WITH PROPOFOL;  Surgeon: Meridee Score  Netty Starring., MD;  Location: [facility];  Service: Gastroenterology;  Laterality: N/A;   ESOPHAGOGASTRODUODENOSCOPY (EGD) WITH PROPOFOL N/A 01/26/2023   Procedure: ESOPHAGOGASTRODUODENOSCOPY (EGD) WITH PROPOFOL;  Surgeon: Meridee Score Netty Starring., MD;  Location: [facility];  Service: Gastroenterology;  Laterality: N/A;   HEMOSTASIS CLIP PLACEMENT  09/21/2020   Procedure: HEMOSTASIS CLIP PLACEMENT;  Surgeon: Lemar Lofty., MD;  Location: [facility];  Service: Gastroenterology;;   HEMOSTASIS CLIP PLACEMENT  10/11/2021   Procedure: HEMOSTASIS CLIP PLACEMENT;  Surgeon: Lemar Lofty., MD;  Location: [facility];  Service: Gastroenterology;;   NO PAST SURGERIES     POLYPECTOMY  10/11/2021   Procedure: POLYPECTOMY;  Surgeon: Lemar Lofty., MD;  Location: [facility];  Service: Gastroenterology;;   SUBMUCOSAL LIFTING INJECTION  09/21/2020   Procedure: SUBMUCOSAL LIFTING INJECTION;  Surgeon: Lemar Lofty., MD;  Location: [facility];  Service: Gastroenterology;;   SUBMUCOSAL LIFTING INJECTION  10/11/2021   Procedure: SUBMUCOSAL LIFTING INJECTION;  Surgeon: Lemar Lofty., MD;  Location: [facility];  Service: Gastroenterology;;   SUBMUCOSAL TATTOO INJECTION  10/11/2021   Procedure: SUBMUCOSAL TATTOO INJECTION;  Surgeon: Lemar Lofty., MD;  Location: [facility];  Service: Gastroenterology;;   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   Helicobacter pylori  infection 07/22/2020   Esophagitis determined by endoscopy 07/22/2020   History of cerebellar stroke 11/26/2019   Mixed hyperlipidemia 11/26/2019   Positive double stranded DNA antibody test 04/10/2019   Chronic hepatitis C without  hepatic coma (HCC) 12/06/2018   HTN (hypertension) 04/30/2018    PCP: Rema Fendt, NP  REFERRING PROVIDER: Rema Fendt, NP  REFERRING DIAG: Right shoulder pain, unspecified chronicity [M25.511]   THERAPY DIAG:  Right shoulder pain, unspecified chronicity  Muscle weakness (generalized)  Rationale for Evaluation and Treatment: Rehabilitation  ONSET DATE: December 2023  SUBJECTIVE:                                                                                                                                                                                      SUBJECTIVE STATEMENT: No change to report regarding symptoms  Hand dominance: Left  PERTINENT HISTORY: cataracts (removed), hep C, depression, GERD, HTN, prediabetes, hx stroke 2021   PAIN:  Are you having pain? Yes, pain in anterior shoulder with movement 6/10 at its worse currently.  PRECAUTIONS: None  RED FLAGS: None   WEIGHT BEARING RESTRICTIONS: No  FALLS:  Has patient fallen in last 6 months? No  LIVING ENVIRONMENT: Lives with: lives with their family Lives in: House/apartment Stairs: No Has following equipment at home: None  OCCUPATION: Works at [employer] part time  PLOF: Independent  PATIENT GOALS: be able to move the shoulder better without causing more pain.  NEXT MD VISIT: May 2024  OBJECTIVE:  Note: Objective measures were completed at Evaluation unless otherwise noted.  DIAGNOSTIC FINDINGS:  R shoulder XR 02/02/23: "IMPRESSION: Acromioclavicular degenerative changes. No acute osseous abnormalities."  PATIENT SURVEYS :  Quick Dash 67 ( 30.83%)  COGNITION: Overall cognitive status: Within functional limits for tasks assessed     SENSATION: Tingling into median nerve innervation with resisted shoulder abduction   POSTURE: Increased kyphosis in thoracic spine  UPPER EXTREMITY ROM:   Active ROM Right eval Left eval  Shoulder flexion 180 180  Shoulder extension 60 60   Shoulder abduction    Shoulder adduction    Shoulder internal rotation t10 t10  Shoulder external rotation c7 c7  Elbow flexion    Elbow extension    Wrist flexion    Wrist extension    Wrist ulnar deviation    Wrist radial deviation    Wrist pronation    Wrist supination    (Blank rows = not tested)  UPPER EXTREMITY MMT:  MMT Right eval Left eval  Shoulder flexion 4- 4  Shoulder extension 4- 4  Shoulder abduction 3+ 5  Shoulder adduction    Shoulder internal rotation  Shoulder external rotation 3+ 4  Middle trapezius    Lower trapezius    Elbow flexion    Elbow extension    Wrist flexion    Wrist extension    Wrist ulnar deviation    Wrist radial deviation    Wrist pronation    Wrist supination    Grip strength (lbs)    (Blank rows = not tested)  SHOULDER SPECIAL TESTS: Impingement tests: Painful arc test: negative  JOINT MOBILITY TESTING:  NI  PALPATION:  Tenderness to upper trap, scalenes, and coracoid process of R shoulder  [facility] Adult PT Treatment:                                                DATE: 05/11/23 Therapeutic Exercise: Nustep L2 6 min R UT stretch 30s x2 Manual Therapy: PROM R shoulder R first rib mob with FF 10x R shoulder mobs 5x10 INF/DISTR/POST Neuromuscular re-ed: Prone shoulder flexion 10x Prone shoulder extension 10x Prone shoulder scaption 10x Prone shoulder hor abd 10x Prone shoulder row 10x  [facility] Adult PT Treatment:                                                DATE: 05/09/23 Therapeutic Exercise: Seated hor abd YTB 15x Seated ER YTB 15x Supine hor abd YTB 15x Supine press and protraction 500g 15x  Manual Therapy: R pec minor release R scalene stretch 30s x3  R first mob into flexion supine 10x 4 way scapula in L S/L 10x Supine PNF D1 F/E 15x  Self Care: Assessment of current symptoms and pain drivers and comparison of current symptoms to initial symptoms.    Patient presents with S&S of scalene entrapment,  elevated first rib and winging R scapula and altered scapulothoracic biomechanics                           [facility] Adult PT Treatment:                                                DATE: 05/02/2023 Self Care: Pt education, detailed below POC discussion   PATIENT EDUCATION: Education details: Pt received education regarding HEP performance, ADL performance, functional activity tolerance, impairment education, appropriate performance of therapeutic activities. Person educated: Patient Education method: Explanation, Demonstration, Tactile cues, Verbal cues, and Handouts Education comprehension: verbalized understanding, returned demonstration, and verbal cues required  HOME EXERCISE PROGRAM: Access Code: [email or web address] URL: [email or web address] Date: 05/02/2023 Prepared by: Sheliah Plane  Exercises - Serratus Activation at Wall  - 1 x daily - 7 x weekly - 2-3 sets - 6 reps - 6s hold - Median Nerve Flossing - Tray  - 1 x daily - 7 x weekly - 2 sets - 20 reps - 1s hold - Prone Scapular Retraction Arms at Side  - 1 x daily - 7 x weekly - 2-3 sets - 10 reps - 3s hold - Prone Scapular Retraction Y  - 1 x daily - 7 x weekly - 2-3 sets - 10 reps - 3s hold - Standing  Shoulder Abduction with Resistance  - 1 x daily - 7 x weekly - 2-3 sets - 8-10 reps - 3s hold  ASSESSMENT:  CLINICAL IMPRESSION: Focus of session was addressing soft tissue restrictions/TrPs which affect R shoulder mobility and function.  Continued restrictions in R scalene group and UT responsible for RUE paresthesias. Despite continued symptoms, R shoulder ROM increasing.    Eval impression (05/02/2023): Pt. attended today's physical therapy session for evaluation of R shoulder pain. Pt has complaints of R shoulder pain, lack of function with daily activities, and occasional tingling into fingers 2-4. Pt has notable deficits with quality of motion in R,  posture, global shoulder weakness with specific impairment in shoulder  abduction and median nerve motility. Pt would benefit from therapeutic focus on global R shoulder strengthening, superior force couple motor recruitment during overhead activity, median nerve motility, and shoulder girdle stability.  Treatment performed today focused on patient education detailed in objective Pt demonstrated great understanding of education provided. required minimal cues and no assistance for appropriate performance with today's activities.  Pt requires the intervention of skilled outpatient physical therapy to address the aforementioned deficits and progress towards a functional level in line with therapeutic goals.    OBJECTIVE IMPAIRMENTS: decreased ROM, decreased strength, impaired sensation, impaired UE functional use, improper body mechanics, postural dysfunction, and pain.   ACTIVITY LIMITATIONS: carrying, lifting, reach over head, and caring for others  PARTICIPATION LIMITATIONS: meal prep, cleaning, laundry, shopping, community activity, and occupation  PERSONAL FACTORS: Behavior pattern and Fitness are also affecting patient's functional outcome.   REHAB POTENTIAL: Good  CLINICAL DECISION MAKING: Stable/uncomplicated  EVALUATION COMPLEXITY: Low  GOALS: Goals reviewed with patient? Yes  SHORT TERM GOALS: Target date: 05/23/2023    Pt will be independent with administered HEP to demonstrate the competency necessary for long term managemnet of symptoms at home.  Baseline:[email or web address] Goal status: INITIAL   LONG TERM GOALS: Target date: 06/13/2023  Pt. Will achieve a DASH score of 55 as to demonstrate improvement in self-perceived functional ability with daily activities.  Baseline: 67 (30.83%) Goal status: INITIAL  2.  Pt will report pain levels improving during ADLs to be less than or equal to 3/10 as to demonstrate improved tolerance with daily functional activities such as prepping food at [employer], and reaching overhead to put a dish away.  Baseline:  6/10 Goal status: INITIAL  3.  Pt will improve MMT score for R shoulder Abduction to a 4+/5 to demonstrate improvement in strength for quality of motion and activity performance.  Baseline: see obj chart Goal status: INITIAL  4.  Pt will perform 160 degrees of R shoulder Abduction with no reports of numbness or tingling into fingers 2-4 as to demonstrate improved median nerve motility. Baseline: 120d w/symptoms  Goal status: INITIAL  PLAN: PT FREQUENCY: 1-2x/week  PT DURATION: 6 weeks  PLANNED INTERVENTIONS: 97110-Therapeutic exercises, 97530- Therapeutic activity, 97112- Neuromuscular re-education, 97535- Self Care, 82956- Manual therapy, [phone]- Electrical stimulation (manual), Patient/Family education, Dry Needling, Joint mobilization, Spinal mobilization, and Moist heat  PLAN FOR NEXT SESSION: Review HEP, Begin POC as detailed in assessment  Learta Codding PT  05/11/2023, 2:51 PM  For all possible CPT codes, reference the Planned Interventions line above.     Check all conditions that are expected to impact treatment: {Conditions expected to impact treatment:None of these apply   If treatment provided at initial evaluation, no treatment charged due to lack of authorization.

## 2023-05-11 ENCOUNTER — Ambulatory Visit: Payer: Medicaid Other

## 2023-05-11 DIAGNOSIS — M25511 Pain in right shoulder: Secondary | ICD-10-CM

## 2023-05-11 DIAGNOSIS — M6281 Muscle weakness (generalized): Secondary | ICD-10-CM | POA: Diagnosis not present

## 2023-05-11 NOTE — Patient Instructions (Signed)

## 2023-05-17 ENCOUNTER — Ambulatory Visit: Admitting: Physical Therapy

## 2023-05-17 DIAGNOSIS — M6281 Muscle weakness (generalized): Secondary | ICD-10-CM | POA: Diagnosis not present

## 2023-05-17 DIAGNOSIS — M25511 Pain in right shoulder: Secondary | ICD-10-CM | POA: Diagnosis not present

## 2023-05-17 NOTE — Therapy (Signed)
 OUTPATIENT PHYSICAL THERAPY TREATMENT NOTE   Patient Name: Katherine Henson MRN: [id number] DOB:12-08-1960, 63 y.o., female Today's Date: 05/17/2023  END OF SESSION:  PT End of Session - 05/17/23 1443     Visit Number 4    Number of Visits 13    Date for PT Re-Evaluation 06/13/23    Authorization - Visit Number 3    Authorization - Number of Visits 5    PT Start Time 1443    PT Stop Time 1521    PT Time Calculation (min) 38 min    Activity Tolerance Patient tolerated treatment well    Behavior During Therapy [facility] for tasks assessed/performed               Past Medical History:  Diagnosis Date   Allergy    Arthritis    Cataract    bilateral removed   Chronic hepatitis C (HCC) 12/06/2018   treated for   Depression    GERD (gastroesophageal reflux disease)    HTN (hypertension)    Hyperlipidemia    Pre-diabetes    Stroke [facility])    mini stroke 2021   Past Surgical History:  Procedure Laterality Date   BIOPSY  01/26/2023   Procedure: BIOPSY;  Surgeon: Lemar Lofty., MD;  Location: [facility];  Service: Gastroenterology;;   COLONOSCOPY     ENDOSCOPIC MUCOSAL RESECTION  09/21/2020   Procedure: ENDOSCOPIC MUCOSAL RESECTION;  Surgeon: Lemar Lofty., MD;  Location: [facility];  Service: Gastroenterology;;   ENDOSCOPIC MUCOSAL RESECTION N/A 10/11/2021   Procedure: ENDOSCOPIC MUCOSAL RESECTION;  Surgeon: Lemar Lofty., MD;  Location: [facility];  Service: Gastroenterology;  Laterality: N/A;   ESOPHAGOGASTRODUODENOSCOPY (EGD) WITH PROPOFOL N/A 09/21/2020   Procedure: ESOPHAGOGASTRODUODENOSCOPY (EGD) WITH PROPOFOL;  Surgeon: Meridee Score Netty Starring., MD;  Location: [facility];  Service: Gastroenterology;  Laterality: N/A;   ESOPHAGOGASTRODUODENOSCOPY (EGD) WITH PROPOFOL N/A 10/11/2021   Procedure: ESOPHAGOGASTRODUODENOSCOPY (EGD) WITH PROPOFOL;  Surgeon: Meridee Score Netty Starring., MD;  Location: [facility];  Service: Gastroenterology;   Laterality: N/A;   ESOPHAGOGASTRODUODENOSCOPY (EGD) WITH PROPOFOL N/A 01/26/2023   Procedure: ESOPHAGOGASTRODUODENOSCOPY (EGD) WITH PROPOFOL;  Surgeon: Meridee Score Netty Starring., MD;  Location: [facility];  Service: Gastroenterology;  Laterality: N/A;   HEMOSTASIS CLIP PLACEMENT  09/21/2020   Procedure: HEMOSTASIS CLIP PLACEMENT;  Surgeon: Lemar Lofty., MD;  Location: [facility];  Service: Gastroenterology;;   HEMOSTASIS CLIP PLACEMENT  10/11/2021   Procedure: HEMOSTASIS CLIP PLACEMENT;  Surgeon: Lemar Lofty., MD;  Location: [facility];  Service: Gastroenterology;;   NO PAST SURGERIES     POLYPECTOMY  10/11/2021   Procedure: POLYPECTOMY;  Surgeon: Lemar Lofty., MD;  Location: [facility];  Service: Gastroenterology;;   SUBMUCOSAL LIFTING INJECTION  09/21/2020   Procedure: SUBMUCOSAL LIFTING INJECTION;  Surgeon: Lemar Lofty., MD;  Location: [facility];  Service: Gastroenterology;;   SUBMUCOSAL LIFTING INJECTION  10/11/2021   Procedure: SUBMUCOSAL LIFTING INJECTION;  Surgeon: Lemar Lofty., MD;  Location: [facility];  Service: Gastroenterology;;   SUBMUCOSAL TATTOO INJECTION  10/11/2021   Procedure: SUBMUCOSAL TATTOO INJECTION;  Surgeon: Lemar Lofty., MD;  Location: [facility];  Service: Gastroenterology;;   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   Helicobacter pylori infection 07/22/2020   Esophagitis determined by endoscopy 07/22/2020  History of cerebellar stroke 11/26/2019   Mixed hyperlipidemia 11/26/2019   Positive double stranded DNA antibody test 04/10/2019   Chronic hepatitis C without hepatic coma (HCC) 12/06/2018   HTN (hypertension) 04/30/2018     PCP: Rema Fendt, NP  REFERRING PROVIDER: Rema Fendt, NP  REFERRING DIAG: Right shoulder pain, unspecified chronicity [M25.511]   THERAPY DIAG:  Right shoulder pain, unspecified chronicity  Muscle weakness (generalized)  Rationale for Evaluation and Treatment: Rehabilitation  ONSET DATE: December 2023  SUBJECTIVE:                                                                                                                                                                                      SUBJECTIVE STATEMENT:  Pt stated that she is doing much better than she was and continues to get better every week. Hand dominance: Left  PERTINENT HISTORY: cataracts (removed), hep C, depression, GERD, HTN, prediabetes, hx stroke 2021   PAIN:  Are you having pain? Yes, pain in anterior shoulder with movement 6/10 at its worse currently.  PRECAUTIONS: None  RED FLAGS: None   WEIGHT BEARING RESTRICTIONS: No  FALLS:  Has patient fallen in last 6 months? No  LIVING ENVIRONMENT: Lives with: lives with their family Lives in: House/apartment Stairs: No Has following equipment at home: None  OCCUPATION: Works at [employer] part time  PLOF: Independent  PATIENT GOALS: be able to move the shoulder better without causing more pain.  NEXT MD VISIT: May 2024  OBJECTIVE:  Note: Objective measures were completed at Evaluation unless otherwise noted.  DIAGNOSTIC FINDINGS:  R shoulder XR 02/02/23: "IMPRESSION: Acromioclavicular degenerative changes. No acute osseous abnormalities."  PATIENT SURVEYS :  Quick Dash 67 ( 30.83%)  COGNITION: Overall cognitive status: Within functional limits for tasks assessed     SENSATION: Tingling into median nerve innervation with resisted shoulder abduction   POSTURE: Increased kyphosis in thoracic spine  UPPER EXTREMITY ROM:   Active ROM Right eval Left eval  Shoulder flexion 180 180  Shoulder extension 60 60  Shoulder  abduction    Shoulder adduction    Shoulder internal rotation t10 t10  Shoulder external rotation c7 c7  Elbow flexion    Elbow extension    Wrist flexion    Wrist extension    Wrist ulnar deviation    Wrist radial deviation    Wrist pronation    Wrist supination    (Blank rows = not tested)  UPPER EXTREMITY MMT:  MMT Right eval Left eval  Shoulder flexion 4- 4  Shoulder extension 4- 4  Shoulder abduction 3+ 5  Shoulder adduction    Shoulder internal rotation  Shoulder external rotation 3+ 4  Middle trapezius    Lower trapezius    Elbow flexion    Elbow extension    Wrist flexion    Wrist extension    Wrist ulnar deviation    Wrist radial deviation    Wrist pronation    Wrist supination    Grip strength (lbs)    (Blank rows = not tested)  SHOULDER SPECIAL TESTS: Impingement tests: Painful arc test: negative  JOINT MOBILITY TESTING:  NI  PALPATION:  Tenderness to upper trap, scalenes, and coracoid process of R shoulder  [facility] Adult PT Treatment:                                                DATE: 05/17/2023  Therapeutic Exercise: Nustep  L5 8 min R UT stretch 2x1' Doorway stretch 2x1' Therapeutic Activity: Serratus activation with YTB and foam roller 3x8, 4s hold Banded row w/ GTB 3x8, 3s hold  Lat pulldown with cable 2x12, 2s hold, 13lbs    [facility] Adult PT Treatment:                                                DATE: 05/11/23 Therapeutic Exercise: Nustep L2 6 min R UT stretch 30s x2 Manual Therapy: PROM R shoulder R first rib mob with FF 10x R shoulder mobs 5x10 INF/DISTR/POST Neuromuscular re-ed: Prone shoulder flexion 10x Prone shoulder extension 10x Prone shoulder scaption 10x Prone shoulder hor abd 10x Prone shoulder row 10x  [facility] Adult PT Treatment:                                                DATE: 05/09/23 Therapeutic Exercise: Seated hor abd YTB 15x Seated ER YTB 15x Supine hor abd YTB 15x Supine press and protraction 500g  15x  Manual Therapy: R pec minor release R scalene stretch 30s x3  R first mob into flexion supine 10x 4 way scapula in L S/L 10x Supine PNF D1 F/E 15x  Self Care: Assessment of current symptoms and pain drivers and comparison of current symptoms to initial symptoms.    Patient presents with S&S of scalene entrapment, elevated first rib and winging R scapula and altered scapulothoracic biomechanics                           [facility] Adult PT Treatment:                                                DATE: 05/02/2023 Self Care: Pt education, detailed below POC discussion   PATIENT EDUCATION: Education details: Pt received education regarding HEP performance, ADL performance, functional activity tolerance, impairment education, appropriate performance of therapeutic activities. Person educated: Patient Education method: Explanation, Demonstration, Tactile cues, Verbal cues, and Handouts Education comprehension: verbalized understanding, returned demonstration, and verbal cues required  HOME EXERCISE PROGRAM: Access Code: [email or web address] URL: [email or web address] Date: 05/02/2023 Prepared by: Sheliah Plane  Exercises - Serratus Activation at Wall  - 1 x daily - 7 x weekly - 2-3 sets - 6 reps - 6s hold - Median Nerve Flossing - Tray  - 1 x daily - 7 x weekly - 2 sets - 20 reps - 1s hold - Prone Scapular Retraction Arms at Side  - 1 x daily - 7 x weekly - 2-3 sets - 10 reps - 3s hold - Prone Scapular Retraction Y  - 1 x daily - 7 x weekly - 2-3 sets - 10 reps - 3s hold - Standing Shoulder Abduction with Resistance  - 1 x daily - 7 x weekly - 2-3 sets - 8-10 reps - 3s hold  ASSESSMENT:  CLINICAL IMPRESSION:  Pt attended physical therapy session for continuation of treatment regarding R shoulder pain and dysfunction. Today's treatment focused on improvement of  R shoulder stability, upper trap motility, GH ROM,and reducing compensation during overhead mobility. Pt showed  great   tolerance to treatment and demonstrated improvement with overhead stability. Some difficulties continue with radicular symptoms, however steadily improving between sessions. Pt required minimal cuing alongside no physical assistance for safe and appropriate performance of today's activities. Continue with therapeutic focus on upper trap motility, pec minor motility, overhead stability .   Eval impression (05/02/2023): Pt. attended today's physical therapy session for evaluation of R shoulder pain. Pt has complaints of R shoulder pain, lack of function with daily activities, and occasional tingling into fingers 2-4. Pt has notable deficits with quality of motion in R,  posture, global shoulder weakness with specific impairment in shoulder abduction and median nerve motility. Pt would benefit from therapeutic focus on global R shoulder strengthening, superior force couple motor recruitment during overhead activity, median nerve motility, and shoulder girdle stability.  Treatment performed today focused on patient education detailed in objective Pt demonstrated great understanding of education provided. required minimal cues and no assistance for appropriate performance with today's activities.  Pt requires the intervention of skilled outpatient physical therapy to address the aforementioned deficits and progress towards a functional level in line with therapeutic goals.    OBJECTIVE IMPAIRMENTS: decreased ROM, decreased strength, impaired sensation, impaired UE functional use, improper body mechanics, postural dysfunction, and pain.   ACTIVITY LIMITATIONS: carrying, lifting, reach over head, and caring for others  PARTICIPATION LIMITATIONS: meal prep, cleaning, laundry, shopping, community activity, and occupation  PERSONAL FACTORS: Behavior pattern and Fitness are also affecting patient's functional outcome.   REHAB POTENTIAL: Good  CLINICAL DECISION MAKING: Stable/uncomplicated  EVALUATION  COMPLEXITY: Low  GOALS: Goals reviewed with patient? Yes  SHORT TERM GOALS: Target date: 05/23/2023    Pt will be independent with administered HEP to demonstrate the competency necessary for long term managemnet of symptoms at home.  Baseline:[email or web address] Goal status: INITIAL   LONG TERM GOALS: Target date: 06/13/2023  Pt. Will achieve a DASH score of 55 as to demonstrate improvement in self-perceived functional ability with daily activities.  Baseline: 67 (30.83%) Goal status: INITIAL  2.  Pt will report pain levels improving during ADLs to be less than or equal to 3/10 as to demonstrate improved tolerance with daily functional activities such as prepping food at [employer], and reaching overhead to put a dish away.  Baseline: 6/10 Goal status: INITIAL  3.  Pt will improve MMT score for R shoulder Abduction to a 4+/5 to demonstrate improvement in strength for quality of motion and activity performance.  Baseline: see obj chart Goal status: INITIAL  4.  Pt will  perform 160 degrees of R shoulder Abduction with no reports of numbness or tingling into fingers 2-4 as to demonstrate improved median nerve motility. Baseline: 120d w/symptoms  Goal status: INITIAL  PLAN: PT FREQUENCY: 1-2x/week  PT DURATION: 6 weeks  PLANNED INTERVENTIONS: 97110-Therapeutic exercises, 97530- Therapeutic activity, 97112- Neuromuscular re-education, 97535- Self Care, 16109- Manual therapy, [id number]- Electrical stimulation (manual), Patient/Family education, Dry Needling, Joint mobilization, Spinal mobilization, and Moist heat  PLAN FOR NEXT SESSION: Review HEP, Begin POC as detailed in assessment  Sheliah Plane, PT, DPT 05/17/2023, 3:25 PM

## 2023-05-23 NOTE — Therapy (Addendum)
 OUTPATIENT PHYSICAL THERAPY TREATMENT NOTE  PHYSICAL THERAPY DISCHARGE SUMMARY  Visits from Start of Care: 5  Current functional level related to goals / functional outcomes: See most recent assessment   Remaining deficits: See most recent assessment   Education / Equipment: See most recent assessment   Patient agrees to discharge. Patient goals were  See most recent assessment . Patient is being discharged due to lack of progress.  Sheliah Plane, PT, DPT 06/08/2023, 8:40 AM   Patient Name: Katherine Henson MRN: [id number] DOB:May 28, 1960, 63 y.o., female Today's Date: 64/20/2025  END OF SESSION:  PT End of Session - 05/25/23 1531     Visit Number 5    Number of Visits 13    Date for PT Re-Evaluation 06/13/23    Authorization Type [employer]    Authorization - Visit Number 5    Authorization - Number of Visits 5    PT Start Time 1530    PT Stop Time 1615    PT Time Calculation (min) 45 min    Activity Tolerance Patient tolerated treatment well    Behavior During Therapy [facility] for tasks assessed/performed                Past Medical History:  Diagnosis Date   Allergy    Arthritis    Cataract    bilateral removed   Chronic hepatitis C (HCC) 12/06/2018   treated for   Depression    GERD (gastroesophageal reflux disease)    HTN (hypertension)    Hyperlipidemia    Pre-diabetes    Stroke [facility])    mini stroke 2021   Past Surgical History:  Procedure Laterality Date   BIOPSY  01/26/2023   Procedure: BIOPSY;  Surgeon: Lemar Lofty., MD;  Location: [facility];  Service: Gastroenterology;;   COLONOSCOPY     ENDOSCOPIC MUCOSAL RESECTION  09/21/2020   Procedure: ENDOSCOPIC MUCOSAL RESECTION;  Surgeon: Lemar Lofty., MD;  Location: [facility];  Service: Gastroenterology;;   ENDOSCOPIC MUCOSAL RESECTION N/A 10/11/2021   Procedure: ENDOSCOPIC MUCOSAL RESECTION;  Surgeon: Lemar Lofty., MD;  Location: [facility];  Service:  Gastroenterology;  Laterality: N/A;   ESOPHAGOGASTRODUODENOSCOPY (EGD) WITH PROPOFOL N/A 09/21/2020   Procedure: ESOPHAGOGASTRODUODENOSCOPY (EGD) WITH PROPOFOL;  Surgeon: Meridee Score Netty Starring., MD;  Location: [facility];  Service: Gastroenterology;  Laterality: N/A;   ESOPHAGOGASTRODUODENOSCOPY (EGD) WITH PROPOFOL N/A 10/11/2021   Procedure: ESOPHAGOGASTRODUODENOSCOPY (EGD) WITH PROPOFOL;  Surgeon: Meridee Score Netty Starring., MD;  Location: [facility];  Service: Gastroenterology;  Laterality: N/A;   ESOPHAGOGASTRODUODENOSCOPY (EGD) WITH PROPOFOL N/A 01/26/2023   Procedure: ESOPHAGOGASTRODUODENOSCOPY (EGD) WITH PROPOFOL;  Surgeon: Meridee Score Netty Starring., MD;  Location: [facility];  Service: Gastroenterology;  Laterality: N/A;   HEMOSTASIS CLIP PLACEMENT  09/21/2020   Procedure: HEMOSTASIS CLIP PLACEMENT;  Surgeon: Lemar Lofty., MD;  Location: [facility];  Service: Gastroenterology;;   HEMOSTASIS CLIP PLACEMENT  10/11/2021   Procedure: HEMOSTASIS CLIP PLACEMENT;  Surgeon: Lemar Lofty., MD;  Location: [facility];  Service: Gastroenterology;;   NO PAST SURGERIES     POLYPECTOMY  10/11/2021   Procedure: POLYPECTOMY;  Surgeon: Lemar Lofty., MD;  Location: [facility];  Service: Gastroenterology;;   SUBMUCOSAL LIFTING INJECTION  09/21/2020   Procedure: SUBMUCOSAL LIFTING INJECTION;  Surgeon: Lemar Lofty., MD;  Location: [facility];  Service: Gastroenterology;;   SUBMUCOSAL LIFTING INJECTION  10/11/2021   Procedure: SUBMUCOSAL LIFTING INJECTION;  Surgeon: Lemar Lofty., MD;  Location: [facility];  Service: Gastroenterology;;   Sunnie Nielsen  TATTOO INJECTION  10/11/2021   Procedure: SUBMUCOSAL TATTOO INJECTION;  Surgeon: Lemar Lofty., MD;  Location: [facility];  Service: Gastroenterology;;   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   Pain  in right shoulder 01/18/2023   Abnormal findings on esophagogastroduodenoscopy (EGD) 07/22/2020   Dysphagia 07/22/2020   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   Helicobacter pylori infection 07/22/2020   Esophagitis determined by endoscopy 07/22/2020   History of cerebellar stroke 11/26/2019   Mixed hyperlipidemia 11/26/2019   Positive double stranded DNA antibody test 04/10/2019   Chronic hepatitis C without hepatic coma (HCC) 12/06/2018   HTN (hypertension) 04/30/2018    PCP: Rema Fendt, NP  REFERRING PROVIDER: Rema Fendt, NP  REFERRING DIAG: Right shoulder pain, unspecified chronicity [M25.511]   THERAPY DIAG:  Right shoulder pain, unspecified chronicity  Muscle weakness (generalized)  Rationale for Evaluation and Treatment: Rehabilitation  ONSET DATE: December 2023  SUBJECTIVE:                                                                                                                                                                                      SUBJECTIVE STATEMENT:  Pt stated that she is doing much better than she was and continues to get better every week. Hand dominance: Left  PERTINENT HISTORY: cataracts (removed), hep C, depression, GERD, HTN, prediabetes, hx stroke 2021   PAIN:  Are you having pain? Yes, pain in anterior shoulder with movement 6/10 at its worse currently.  PRECAUTIONS: None  RED FLAGS: None   WEIGHT BEARING RESTRICTIONS: No  FALLS:  Has patient fallen in last 6 months? No  LIVING ENVIRONMENT: Lives with: lives with their family Lives in: House/apartment Stairs: No Has following equipment at home: None  OCCUPATION: Works at [employer] part time  PLOF: Independent  PATIENT GOALS: be able to move the shoulder better without causing more pain.  NEXT MD VISIT: May 2024  OBJECTIVE:  Note: Objective measures were completed at Evaluation unless otherwise noted.  DIAGNOSTIC FINDINGS:  R shoulder XR  02/02/23: "IMPRESSION: Acromioclavicular degenerative changes. No acute osseous abnormalities."  PATIENT SURVEYS :  Quick Dash 67 ( 30.83%)  COGNITION: Overall cognitive status: Within functional limits for tasks assessed     SENSATION: Tingling into median nerve innervation with resisted shoulder abduction   POSTURE: Increased kyphosis in thoracic spine  UPPER EXTREMITY ROM:   Active ROM Right eval Left eval R 05/25/23  Shoulder flexion 180 180 160  Shoulder extension 60 60   Shoulder abduction   160  Shoulder adduction     Shoulder internal rotation  t10 t10   Shoulder external rotation c7 c7   Elbow flexion     Elbow extension     Wrist flexion     Wrist extension     Wrist ulnar deviation     Wrist radial deviation     Wrist pronation     Wrist supination     (Blank rows = not tested)  UPPER EXTREMITY MMT:  MMT Right eval Left eval  Shoulder flexion 4- 4  Shoulder extension 4- 4  Shoulder abduction 3+ 5  Shoulder adduction    Shoulder internal rotation    Shoulder external rotation 3+ 4  Middle trapezius    Lower trapezius    Elbow flexion    Elbow extension    Wrist flexion    Wrist extension    Wrist ulnar deviation    Wrist radial deviation    Wrist pronation    Wrist supination    Grip strength (lbs)    (Blank rows = not tested)  CERVICAL ROM:   Active ROM A/PROM (deg) eval  Flexion 50%  Extension 50%  Right lateral flexion 50%  Left lateral flexion 25%  Right rotation 75%  Left rotation 50%P!   (Blank rows = not tested)   SHOULDER SPECIAL TESTS: Impingement tests: Painful arc test: negative  JOINT MOBILITY TESTING:  NI  PALPATION:  Tenderness to upper trap, scalenes, and coracoid process of R shoulder  [facility] Adult PT Treatment:                                                DATE: 05/25/23 Therapeutic Exercise: Nustep L4 6 min Manual Therapy: Manual R scalene stretch 30s x3 Therapeutic Activity: Supine press and protraction  500g ball 10x Supine hor abd YTB 5x Assessment of cervical ROM as well as R ULTT to identify symptom drivers.    [facility] Adult PT Treatment:                                                DATE: 05/17/2023  Therapeutic Exercise: Nustep  L5 8 min R UT stretch 2x1' Doorway stretch 2x1' Therapeutic Activity: Serratus activation with YTB and foam roller 3x8, 4s hold Banded row w/ GTB 3x8, 3s hold  Lat pulldown with cable 2x12, 2s hold, 13lbs    [facility] Adult PT Treatment:                                                DATE: 05/11/23 Therapeutic Exercise: Nustep L2 6 min R UT stretch 30s x2 Manual Therapy: PROM R shoulder R first rib mob with FF 10x R shoulder mobs 5x10 INF/DISTR/POST Neuromuscular re-ed: Prone shoulder flexion 10x Prone shoulder extension 10x Prone shoulder scaption 10x Prone shoulder hor abd 10x Prone shoulder row 10x  [facility] Adult PT Treatment:                                                DATE: 05/09/23  Therapeutic Exercise: Seated hor abd YTB 15x Seated ER YTB 15x Supine hor abd YTB 15x Supine press and protraction 500g 15x  Manual Therapy: R pec minor release R scalene stretch 30s x3  R first mob into flexion supine 10x 4 way scapula in L S/L 10x Supine PNF D1 F/E 15x  Self Care: Assessment of current symptoms and pain drivers and comparison of current symptoms to initial symptoms.    Patient presents with S&S of scalene entrapment, elevated first rib and winging R scapula and altered scapulothoracic biomechanics                           [facility] Adult PT Treatment:                                                DATE: 05/02/2023 Self Care: Pt education, detailed below POC discussion   PATIENT EDUCATION: Education details: Pt received education regarding HEP performance, ADL performance, functional activity tolerance, impairment education, appropriate performance of therapeutic activities. Person educated: Patient Education method: Explanation,  Demonstration, Tactile cues, Verbal cues, and Handouts Education comprehension: verbalized understanding, returned demonstration, and verbal cues required  HOME EXERCISE PROGRAM: Access Code: [email or web address] URL: [email or web address] Date: 05/02/2023 Prepared by: Sheliah Plane  Exercises - Serratus Activation at Wall  - 1 x daily - 7 x weekly - 2-3 sets - 6 reps - 6s hold - Median Nerve Flossing - Tray  - 1 x daily - 7 x weekly - 2 sets - 20 reps - 1s hold - Prone Scapular Retraction Arms at Side  - 1 x daily - 7 x weekly - 2-3 sets - 10 reps - 3s hold - Prone Scapular Retraction Y  - 1 x daily - 7 x weekly - 2-3 sets - 10 reps - 3s hold - Standing Shoulder Abduction with Resistance  - 1 x daily - 7 x weekly - 2-3 sets - 8-10 reps - 3s hold  ASSESSMENT:  CLINICAL IMPRESSION: Patient demonstrates functional AROM in R shoulder with some discomfort in R scalene group at end range.  Assessment of cervical ROM finds marked restrictions as well as reproduction of R shoulder symptoms.  Positive R  ULTT noted. Marked tightness noted in R scalene group.  Patient unable to tolerate previously performed exercises due to R shoulder pain.  Based on findings today, recommend pause further OPPT and refer to ortho to further assess [facility] joint as well as c-spine  Pt attended physical therapy session for continuation of treatment regarding R shoulder pain and dysfunction. Today's treatment focused on improvement of  R shoulder stability, upper trap motility, GH ROM,and reducing compensation during overhead mobility. Pt showed  great  tolerance to treatment and demonstrated improvement with overhead stability. Some difficulties continue with radicular symptoms, however steadily improving between sessions. Pt required minimal cuing alongside no physical assistance for safe and appropriate performance of today's activities. Continue with therapeutic focus on upper trap motility, pec minor motility, overhead  stability .   Eval impression (05/02/2023): Pt. attended today's physical therapy session for evaluation of R shoulder pain. Pt has complaints of R shoulder pain, lack of function with daily activities, and occasional tingling into fingers 2-4. Pt has notable deficits with quality of motion in R,  posture, global shoulder weakness with specific impairment in shoulder abduction and  median nerve motility. Pt would benefit from therapeutic focus on global R shoulder strengthening, superior force couple motor recruitment during overhead activity, median nerve motility, and shoulder girdle stability.  Treatment performed today focused on patient education detailed in objective Pt demonstrated great understanding of education provided. required minimal cues and no assistance for appropriate performance with today's activities.  Pt requires the intervention of skilled outpatient physical therapy to address the aforementioned deficits and progress towards a functional level in line with therapeutic goals.    OBJECTIVE IMPAIRMENTS: decreased ROM, decreased strength, impaired sensation, impaired UE functional use, improper body mechanics, postural dysfunction, and pain.   ACTIVITY LIMITATIONS: carrying, lifting, reach over head, and caring for others  PARTICIPATION LIMITATIONS: meal prep, cleaning, laundry, shopping, community activity, and occupation  PERSONAL FACTORS: Behavior pattern and Fitness are also affecting patient's functional outcome.   REHAB POTENTIAL: Good  CLINICAL DECISION MAKING: Stable/uncomplicated  EVALUATION COMPLEXITY: Low  GOALS: Goals reviewed with patient? Yes  SHORT TERM GOALS: Target date: 05/23/2023    Pt will be independent with administered HEP to demonstrate the competency necessary for long term managemnet of symptoms at home.  Baseline:[email or web address] Goal status: Met   LONG TERM GOALS: Target date: 06/13/2023  Pt. Will achieve a DASH score of 55 as to demonstrate  improvement in self-perceived functional ability with daily activities.  Baseline: 67 (30.83%) 21/55 Goal status: Ongoing  2.  Pt will report pain levels improving during ADLs to be less than or equal to 3/10 as to demonstrate improved tolerance with daily functional activities such as prepping food at [employer], and reaching overhead to put a dish away.  Baseline: 6/10 Goal status: Ongoing  3.  Pt will improve MMT score for R shoulder Abduction to a 4+/5 to demonstrate improvement in strength for quality of motion and activity performance.  Baseline: see obj chart Goal status: Ongoing  4.  Pt will perform 160 degrees of R shoulder Abduction with no reports of numbness or tingling into fingers 2-4 as to demonstrate improved median nerve motility. Baseline: 120d w/symptoms ; 05/25/23 160d Goal status: Met  PLAN: PT FREQUENCY: 1-2x/week  PT DURATION: 6 weeks  PLANNED INTERVENTIONS: 97110-Therapeutic exercises, 97530- Therapeutic activity, 97112- Neuromuscular re-education, 97535- Self Care, 91478- Manual therapy, [phone]- Electrical stimulation (manual), Patient/Family education, Dry Needling, Joint mobilization, Spinal mobilization, and Moist heat  PLAN FOR NEXT SESSION: Review HEP, Begin POC as detailed in assessment  Sheliah Plane, PT, DPT 05/25/2023, 4:17 PM

## 2023-05-25 ENCOUNTER — Ambulatory Visit

## 2023-05-25 ENCOUNTER — Other Ambulatory Visit: Payer: Self-pay | Admitting: Gastroenterology

## 2023-05-25 DIAGNOSIS — M6281 Muscle weakness (generalized): Secondary | ICD-10-CM

## 2023-05-25 DIAGNOSIS — M25511 Pain in right shoulder: Secondary | ICD-10-CM | POA: Diagnosis not present

## 2023-05-26 ENCOUNTER — Other Ambulatory Visit: Payer: Self-pay | Admitting: Gastroenterology

## 2023-07-12 ENCOUNTER — Other Ambulatory Visit: Payer: Self-pay | Admitting: Family

## 2023-07-12 DIAGNOSIS — M25511 Pain in right shoulder: Secondary | ICD-10-CM

## 2023-07-12 NOTE — Telephone Encounter (Signed)
 Complete

## 2023-07-15 ENCOUNTER — Encounter (HOSPITAL_COMMUNITY): Payer: Self-pay

## 2023-07-15 ENCOUNTER — Ambulatory Visit (HOSPITAL_COMMUNITY)
Admission: EM | Admit: 2023-07-15 | Discharge: 2023-07-15 | Disposition: A | Discharge status: Home / Self Care | Attending: Emergency Medicine | Admitting: Emergency Medicine

## 2023-07-15 ENCOUNTER — Telehealth (HOSPITAL_COMMUNITY): Payer: Self-pay

## 2023-07-15 ENCOUNTER — Other Ambulatory Visit: Payer: Self-pay

## 2023-07-15 DIAGNOSIS — I10 Essential (primary) hypertension: Secondary | ICD-10-CM

## 2023-07-15 DIAGNOSIS — K529 Noninfective gastroenteritis and colitis, unspecified: Secondary | ICD-10-CM

## 2023-07-15 LAB — POCT URINALYSIS DIP (MANUAL ENTRY)
Blood, UA: NEGATIVE
Glucose, UA: NEGATIVE mg/dL
Leukocytes, UA: NEGATIVE
Nitrite, UA: NEGATIVE
Protein Ur, POC: 30 mg/dL — AB
Spec Grav, UA: 1.02 (ref 1.010–1.025)
Urobilinogen, UA: 0.2 U/dL
pH, UA: 5.5 (ref 5.0–8.0)

## 2023-07-15 MED ORDER — ONDANSETRON 4 MG PO TBDP
4.0000 mg | ORAL_TABLET | Freq: Once | ORAL | Status: AC
  Administered 2023-07-15: 4 mg via ORAL

## 2023-07-15 MED ORDER — ONDANSETRON 4 MG PO TBDP: 4.0000 mg | ORAL_TABLET | Freq: Three times a day (TID) | ORAL | 0 refills | Status: AC | PRN

## 2023-07-15 MED ORDER — ONDANSETRON 4 MG PO TBDP
ORAL_TABLET | ORAL | Status: AC
  Filled 2023-07-15: qty 1

## 2023-07-15 NOTE — Discharge Instructions (Addendum)
 Use the Zofran  every 8 hours to help with nausea and vomiting.  You can use Imodium sparingly to help with diarrhea.  Ensure you are drinking at least 64 ounces of water daily, you can also add an electrolyte solution such as liquid IV or Pedialyte to help rehydrate you.  Today I suggest a bland diet such as Jell-O, broth and water.  If things go well you can add other bland foods such as bananas, rice, toast and applesauce.  Boiled chicken works as well.  Avoid fried or spicy foods as these will further upset the stomach.  Return to clinic for any new or urgent symptoms.

## 2023-07-15 NOTE — ED Provider Notes (Signed)
 [facility]    CSN: [id number] Arrival date & time: 07/15/23  1005      History   Chief Complaint Chief Complaint  Patient presents with   Nausea   Emesis   Diarrhea    HPI Katherine Henson is a 63 y.o. female.   Patient presents to clinic over concerns of nausea, vomiting and diarrhea for the past 4 days.  She is not having any abdominal pain or fevers.  She has been able to tolerate ginger ale and saltines, but is unable to advance her diet further, this causes emesis.  She has taken Imodium and Pepto with some relief of the diarrhea.  Denies blood in stool or emesis.  Reports her emesis is clear.  The history is provided by the patient and medical records.  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   Helicobacter pylori infection 07/22/2020   Esophagitis determined by endoscopy 07/22/2020   History of cerebellar stroke 11/26/2019   Mixed hyperlipidemia 11/26/2019   Positive double stranded DNA antibody test 04/10/2019   Chronic hepatitis C without hepatic coma (HCC) 12/06/2018   HTN (hypertension) 04/30/2018    Past Surgical History:  Procedure Laterality Date   BIOPSY  01/26/2023   Procedure: BIOPSY;  Surgeon: Normie Becton., MD;  Location: [facility];  Service: Gastroenterology;;   COLONOSCOPY     ENDOSCOPIC MUCOSAL RESECTION  09/21/2020   Procedure: ENDOSCOPIC  MUCOSAL RESECTION;  Surgeon: Normie Becton., MD;  Location: [facility];  Service: Gastroenterology;;   ENDOSCOPIC MUCOSAL RESECTION N/A 10/11/2021   Procedure: ENDOSCOPIC MUCOSAL RESECTION;  Surgeon: Normie Becton., MD;  Location: [facility];  Service: Gastroenterology;  Laterality: N/A;   ESOPHAGOGASTRODUODENOSCOPY (EGD) WITH PROPOFOL  N/A 09/21/2020   Procedure: ESOPHAGOGASTRODUODENOSCOPY (EGD) WITH PROPOFOL ;  Surgeon: Brice Campi Albino Alu., MD;  Location: [facility];  Service: Gastroenterology;  Laterality: N/A;   ESOPHAGOGASTRODUODENOSCOPY (EGD) WITH PROPOFOL  N/A 10/11/2021   Procedure: ESOPHAGOGASTRODUODENOSCOPY (EGD) WITH PROPOFOL ;  Surgeon: Brice Campi Albino Alu., MD;  Location: [facility];  Service: Gastroenterology;  Laterality: N/A;   ESOPHAGOGASTRODUODENOSCOPY (EGD) WITH PROPOFOL  N/A 01/26/2023   Procedure: ESOPHAGOGASTRODUODENOSCOPY (EGD) WITH PROPOFOL ;  Surgeon: Brice Campi Albino Alu., MD;  Location: [facility];  Service: Gastroenterology;  Laterality: N/A;   HEMOSTASIS CLIP PLACEMENT  09/21/2020   Procedure: HEMOSTASIS CLIP PLACEMENT;  Surgeon: Normie Becton., MD;  Location: [facility];  Service: Gastroenterology;;   HEMOSTASIS CLIP PLACEMENT  10/11/2021   Procedure: HEMOSTASIS CLIP PLACEMENT;  Surgeon: Normie Becton., MD;  Location: [facility];  Service: Gastroenterology;;   NO PAST SURGERIES     POLYPECTOMY  10/11/2021   Procedure: POLYPECTOMY;  Surgeon: Normie Becton., MD;  Location: [facility];  Service: Gastroenterology;;   SUBMUCOSAL LIFTING INJECTION  09/21/2020   Procedure: SUBMUCOSAL LIFTING INJECTION;  Surgeon: Normie Becton., MD;  Location: [facility];  Service:  Gastroenterology;;   SUBMUCOSAL LIFTING INJECTION  10/11/2021   Procedure: SUBMUCOSAL LIFTING INJECTION;  Surgeon: Normie Becton., MD;  Location: [facility];  Service: Gastroenterology;;   SUBMUCOSAL TATTOO INJECTION  10/11/2021    Procedure: SUBMUCOSAL TATTOO INJECTION;  Surgeon: Normie Becton., MD;  Location: [facility];  Service: Gastroenterology;;    OB History     Gravida  5   Para  5   Term  5   Preterm  0   AB  0   Living  5      SAB  0   IAB  0   Ectopic  0   Multiple  0   Live Births  5            Home Medications    Prior to Admission medications   Medication Sig Start Date End Date Taking? Authorizing Provider  Bismuth /Metronidaz/Tetracyclin (PYLERA) 140-125-125 MG CAPS Take 3 capsules by mouth 4 (four) times daily for 10 days. 01/31/23 02/10/23  Mansouraty, Albino Alu., MD  ondansetron  (ZOFRAN -ODT) 4 MG disintegrating tablet Take 1 tablet (4 mg total) by mouth every 8 (eight) hours as needed for nausea or vomiting. 07/15/23  Yes Harlow Lighter, Loryn Haacke  N, FNP  amLODipine  (NORVASC ) 10 MG tablet Take 1 tablet (10 mg total) by mouth daily. 04/18/23   Senaida Dama, NP  aspirin  81 MG chewable tablet Chew 1 tablet (81 mg total) by mouth daily. 10/05/20   Mansouraty, Gabriel Jr., MD  Azelastine -Fluticasone  (DYMISTA ) 137-50 MCG/ACT SUSP Place 1 spray into the nose every 12 (twelve) hours. 05/10/22 04/18/23  Eloise Hake Scales, PA-C  cetirizine  (ZYRTEC  ALLERGY) 10 MG tablet Take 1 tablet (10 mg total) by mouth at bedtime. 05/10/22 04/18/23  Eloise Hake Scales, PA-C  gabapentin  (NEURONTIN ) 300 MG capsule Take 1 capsule by mouth at bedtime 07/12/23   Senaida Dama, NP  losartan  (COZAAR ) 50 MG tablet Take 50 mg by mouth daily.    [provider]  losartan  (COZAAR ) 50 MG tablet Take 1 tablet (50 mg total) by mouth daily. 04/18/23 07/17/23  Senaida Dama, NP  metoprolol  tartrate (LOPRESSOR ) 25 MG tablet Take 1/2 (one-half) tablet by mouth twice daily 04/18/23   Senaida Dama, NP  pantoprazole  (PROTONIX ) 40 MG tablet Take 1 tablet by mouth twice daily 05/26/23   Mansouraty, Gabriel Jr., MD  promethazine -dextromethorphan (PROMETHAZINE -DM) 6.25-15 MG/5ML syrup Take 5 mLs by mouth 3  (three) times daily as needed for cough. 09/22/22   Buena Carmine, NP  simvastatin  (ZOCOR ) 40 MG tablet Take 1 tablet (40 mg total) by mouth daily at 6 PM. 04/18/23   Senaida Dama, NP    Family History Family History  Problem Relation Age of Onset   Stroke Mother    Anuerysm Mother        brain   Diabetes Sister    Breast cancer Sister    Breast cancer Sister    Diabetes Brother    Prostate cancer Brother    Breast cancer Niece    Breast cancer Niece    Colon cancer Neg Hx    Esophageal cancer Neg Hx    Liver disease Neg Hx    Stomach cancer Neg Hx    Pancreatic cancer Neg Hx    Inflammatory bowel disease Neg Hx    Rectal cancer Neg Hx    Colon polyps Neg Hx    Crohn's disease Neg Hx    Ulcerative colitis Neg Hx     Social History Social  History   Tobacco Use   Smoking status: Former    Current packs/day: 0.00    Average packs/day: 0.5 packs/day for 30.0 years (15.0 ttl pk-yrs)    Types: Cigarettes    Start date: 12/05/1980    Quit date: 12/06/2010    Years since quitting: 12.6    Passive exposure: Past   Smokeless tobacco: Never   Tobacco comments:    Quit again 2021  Vaping Use   Vaping status: Never Used  Substance Use Topics   Alcohol use: Yes    Alcohol/week: 1.0 standard drink of alcohol    Types: 1 Glasses of wine per week    Comment: occasional   Drug use: Not Currently    Types: Marijuana     Allergies   Penicillins   Review of Systems Review of Systems  Per HPI  Physical Exam Triage Vital Signs ED Triage Vitals  Encounter Vitals Group     BP 07/15/23 1025 113/73     Systolic BP Percentile --      Diastolic BP Percentile --      Pulse Rate 07/15/23 1025 61     Resp 07/15/23 1025 18     Temp 07/15/23 1025 98.1 F (36.7 C)     Temp Source 07/15/23 1025 Oral     SpO2 07/15/23 1025 100 %     Weight --      Height --      Head Circumference --      Peak Flow --      Pain Score 07/15/23 1024 0     Pain Loc --      Pain  Education --      Exclude from Growth Chart --    No data found.  Updated Vital Signs BP 113/73 (BP Location: Right Arm)   Pulse 61   Temp 98.1 F (36.7 C) (Oral)   Resp 18   LMP 09/27/2012   SpO2 100%   Visual Acuity Right Eye Distance:   Left Eye Distance:   Bilateral Distance:    Right Eye Near:   Left Eye Near:    Bilateral Near:     Physical Exam Vitals and nursing note reviewed.  Constitutional:      Appearance: Normal appearance.  HENT:     Head: Normocephalic and atraumatic.     Right Ear: External ear normal.     Left Ear: External ear normal.     Nose: Nose normal.     Mouth/Throat:     Mouth: Mucous membranes are moist.  Cardiovascular:     Rate and Rhythm: Normal rate.  Pulmonary:     Effort: Pulmonary effort is normal. No respiratory distress.  Abdominal:     General: Abdomen is flat. Bowel sounds are normal. There is no distension.     Palpations: Abdomen is soft.     Tenderness: There is no abdominal tenderness. There is no guarding or rebound.  Skin:    General: Skin is warm and dry.  Neurological:     General: No focal deficit present.     Mental Status: She is alert.  Psychiatric:        Mood and Affect: Mood normal.      UC Treatments / Results  Labs (all labs ordered are listed, but only abnormal results are displayed) Labs Reviewed  POCT URINALYSIS DIP (MANUAL ENTRY) - Abnormal; Notable for the following components:      Result Value   Bilirubin, UA small (*)  Ketones, POC UA trace (5) (*)    Protein Ur, POC =30 (*)    All other components within normal limits    EKG   Radiology No results found.  Procedures Procedures (including critical care time)  Medications Ordered in UC Medications  ondansetron  (ZOFRAN -ODT) disintegrating tablet 4 mg (4 mg Oral Given 07/15/23 1037)    Initial Impression / Assessment and Plan / UC Course  I have reviewed the triage vital signs and the nursing notes.  Pertinent labs & imaging  results that were available during my care of the patient were reviewed by me and considered in my medical decision making (see chart for details).  Vitals in triage reviewed, patient is hemodynamically stable.  Abdomen is soft and nontender with active bowel sounds.  Low concern for emergent abdominal etiology.  UA concerning for mild dehydration.  Tolerating liquids in clinic, nausea improved after Zofran .  Suspect gastroenteritis, gradual diet progression discussed.  Plan of care, follow-up care return precautions given, no questions at this time.  Work note provided.     Final Clinical Impressions(s) / UC Diagnoses   Final diagnoses:  Gastroenteritis     Discharge Instructions      Use the Zofran  every 8 hours to help with nausea and vomiting.  You can use Imodium sparingly to help with diarrhea.  Ensure you are drinking at least 64 ounces of water daily, you can also add an electrolyte solution such as liquid IV or Pedialyte to help rehydrate you.  Today I suggest a bland diet such as Jell-O, broth and water.  If things go well you can add other bland foods such as bananas, rice, toast and applesauce.  Boiled chicken works as well.  Avoid fried or spicy foods as these will further upset the stomach.  Return to clinic for any new or urgent symptoms.   ED Prescriptions     Medication Sig Dispense Auth. Provider   ondansetron  (ZOFRAN -ODT) 4 MG disintegrating tablet Take 1 tablet (4 mg total) by mouth every 8 (eight) hours as needed for nausea or vomiting. 20 tablet Harlow Lighter, Kolbie Lepkowski  N, FNP      PDMP not reviewed this encounter.   Harlow Lighter, Damaris Abeln  N, FNP 07/15/23 1108

## 2023-07-15 NOTE — ED Triage Notes (Signed)
 Pt states that for 4 days she has had n/v/d. Pt states she is in no pain she just can't keep anything down. Pt has tried OTC Pepto w/ some relief and imodium.

## 2023-07-17 ENCOUNTER — Encounter: Payer: Self-pay | Admitting: Family

## 2023-07-17 ENCOUNTER — Ambulatory Visit (INDEPENDENT_AMBULATORY_CARE_PROVIDER_SITE_OTHER): Payer: Medicaid Other | Admitting: Family

## 2023-07-17 VITALS — BP 100/64 | HR 54 | Temp 97.8°F | Resp 16 | Ht 60.0 in | Wt 140.0 lb

## 2023-07-17 DIAGNOSIS — M25511 Pain in right shoulder: Secondary | ICD-10-CM

## 2023-07-17 DIAGNOSIS — Z23 Encounter for immunization: Secondary | ICD-10-CM | POA: Diagnosis not present

## 2023-07-17 DIAGNOSIS — I10 Essential (primary) hypertension: Secondary | ICD-10-CM | POA: Diagnosis not present

## 2023-07-17 DIAGNOSIS — E785 Hyperlipidemia, unspecified: Secondary | ICD-10-CM | POA: Diagnosis not present

## 2023-07-17 MED ORDER — METOPROLOL TARTRATE 25 MG PO TABS: ORAL_TABLET | ORAL | 0 refills | Status: DC

## 2023-07-17 MED ORDER — AMLODIPINE BESYLATE 10 MG PO TABS: 10.0000 mg | ORAL_TABLET | Freq: Every day | ORAL | 0 refills | Status: DC

## 2023-07-17 MED ORDER — LOSARTAN POTASSIUM 50 MG PO TABS: 50.0000 mg | ORAL_TABLET | Freq: Every day | ORAL | 0 refills | Status: DC

## 2023-07-17 MED ORDER — GABAPENTIN 300 MG PO CAPS: 300.0000 mg | ORAL_CAPSULE | Freq: Every day | ORAL | 0 refills | Status: DC

## 2023-07-17 MED ORDER — SIMVASTATIN 40 MG PO TABS: 40.0000 mg | ORAL_TABLET | Freq: Every day | ORAL | 0 refills | Status: DC

## 2023-07-17 NOTE — Progress Notes (Signed)
 Patient ID: Katherine Henson, female    DOB: 1960/10/06  MRN: [id number]  CC: Chronic Conditions Follow-Up  Subjective: Katherine Henson is a 63 y.o. female who presents for chronic conditions follow-up.  Her concerns today include:  - Doing well on Amlodipine , Losartan , and Metoprolol  Tartrate, no issues/concerns. She does not complain of red flag symptoms such as but not limited to chest pain, shortness of breath, worst headache of life, nausea/vomiting.  - Doing well on Simvastatin , no issues/concerns.  - Doing well on Gabapentin , no issues/concerns.  - States feeling much better since recent Urgent Care visit.   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 30 tablet 0   Bismuth /Metronidaz/Tetracyclin (PYLERA) 140-125-125 MG CAPS Take 3 capsules by mouth 4 (four) times daily for 10 days. 120 capsule 0   losartan  (COZAAR ) 50 MG tablet Take 1 tablet (50 mg total) by mouth daily. 90 tablet 0   ondansetron  (ZOFRAN -ODT) 4 MG disintegrating tablet Take 1 tablet (4 mg total) by mouth every 8 (eight) hours as needed for nausea or vomiting. 20 tablet 0   pantoprazole  (PROTONIX ) 40 MG tablet Take 1 tablet by mouth twice daily 180 tablet 0   promethazine -dextromethorphan  (PROMETHAZINE -DM) 6.25-15 MG/5ML syrup Take 5 mLs by mouth 3 (three) times daily as needed for cough. 240 mL 0   Azelastine -Fluticasone  (DYMISTA ) 137-50 MCG/ACT SUSP Place 1 spray into the nose every 12 (twelve) hours. 23 g 2   cetirizine  (ZYRTEC  ALLERGY) 10 MG tablet Take 1 tablet (10 mg total) by mouth at bedtime. 90 tablet 1   No current facility-administered medications on file prior to visit.    Allergies  Allergen Reactions   Penicillins Anaphylaxis    Did it involve swelling of the face/tongue/throat, SOB, or low BP? No Did it involve sudden or severe rash/hives, skin peeling, or any reaction on the inside of your mouth or nose? Yes Did you need to seek medical attention at a hospital or doctor's office? No When did it last happen?  30 years ago     If all above answers are "NO", may proceed with cephalosporin use.    Social History   Socioeconomic History   Marital status: Single    Spouse name: Not on file   Number of children: 5   Years of education: Not on file   Highest education level: Not on file  Occupational History   Occupation: housekeeper  Tobacco Use   Smoking status: Former    Current packs/day: 0.00    Average packs/day: 0.5 packs/day for 30.0 years (15.0 ttl pk-yrs)    Types: Cigarettes    Start date: 12/05/1980    Quit date: 12/06/2010    Years since quitting: 12.6    Passive exposure: Past   Smokeless tobacco: Never   Tobacco  comments:    Quit again 2021  Vaping Use   Vaping status: Never Used  Substance and Sexual Activity   Alcohol use: Yes    Alcohol/week: 1.0 standard drink of alcohol    Types: 1 Glasses of wine per week    Comment: occasional   Drug use: Not Currently    Types: Marijuana   Sexual activity: Not Currently  Other Topics Concern   Not on file  Social History Narrative   Left handed   One story home   No caffeine   Social Drivers of Health   Financial Resource Strain: Medium Risk (04/18/2023)   Overall Financial  Resource Strain (CARDIA)    Difficulty of Paying Living Expenses: Somewhat hard  Food Insecurity: Food Insecurity Present (02/03/2021)   Hunger Vital Sign    Worried About Running Out of Food in the Last Year: Often true    Ran Out of Food in the Last Year: Sometimes true  Transportation Needs: Unmet Transportation Needs (02/03/2021)   PRAPARE - Transportation    Lack of Transportation (Medical): Yes    Lack of Transportation (Non-Medical): Yes  Physical Activity: Sufficiently Active (04/18/2023)   Exercise Vital Sign    Days of Exercise per Week: 7 days    Minutes of Exercise per Session: 40 min  Stress: No Stress Concern Present (04/18/2023)   [employer] of Occupational Health - Occupational Stress Questionnaire    Feeling of Stress : Not at all  Social Connections: Moderately Integrated (04/18/2023)   Social Connection and Isolation Panel [NHANES]    Frequency of Communication with Friends and Family: More than three times a week    Frequency of Social Gatherings with Friends and Family: Three times a week    Attends Religious Services: More than 4 times per year    Active Member of Clubs or Organizations: Yes    Attends Banker Meetings: More than 4 times per year    Marital Status: Never married  Intimate Partner Violence: Not At Risk (07/17/2023)   Humiliation, Afraid, Rape, and Kick questionnaire    Fear of Current or Ex-Partner: No    Emotionally Abused: No    Physically Abused: No    Sexually Abused: No    Family History  Problem Relation Age of Onset   Stroke Mother    Anuerysm Mother        brain   Diabetes Sister    Breast cancer Sister    Breast cancer Sister    Diabetes Brother    Prostate cancer Brother    Breast cancer Niece    Breast cancer Niece    Colon cancer Neg Hx    Esophageal cancer Neg Hx    Liver disease Neg Hx    Stomach cancer Neg Hx    Pancreatic cancer Neg Hx    Inflammatory bowel disease Neg Hx    Rectal cancer Neg Hx     Colon polyps Neg Hx    Crohn's disease Neg Hx    Ulcerative colitis Neg Hx     Past Surgical History:  Procedure Laterality Date   BIOPSY  01/26/2023   Procedure: BIOPSY;  Surgeon: Brice Campi, Albino Alu., MD;  Location: [facility];  Service: Gastroenterology;;   COLONOSCOPY     ENDOSCOPIC MUCOSAL RESECTION  09/21/2020   Procedure: ENDOSCOPIC MUCOSAL RESECTION;  Surgeon: Normie Becton., MD;  Location: [facility];  Service: Gastroenterology;;   ENDOSCOPIC MUCOSAL RESECTION N/A 10/11/2021   Procedure: ENDOSCOPIC MUCOSAL RESECTION;  Surgeon: Normie Becton., MD;  Location: [facility];  Service: Gastroenterology;  Laterality: N/A;   ESOPHAGOGASTRODUODENOSCOPY (EGD) WITH PROPOFOL  N/A 09/21/2020   Procedure: ESOPHAGOGASTRODUODENOSCOPY (EGD) WITH PROPOFOL ;  Surgeon: Brice Campi Albino Alu., MD;  Location: [facility];  Service: Gastroenterology;  Laterality: N/A;   ESOPHAGOGASTRODUODENOSCOPY (EGD) WITH PROPOFOL  N/A 10/11/2021   Procedure: ESOPHAGOGASTRODUODENOSCOPY (EGD) WITH PROPOFOL ;  Surgeon: Brice Campi Albino Alu., MD;  Location: [facility];  Service: Gastroenterology;  Laterality: N/A;   ESOPHAGOGASTRODUODENOSCOPY (EGD) WITH PROPOFOL  N/A 01/26/2023   Procedure: ESOPHAGOGASTRODUODENOSCOPY (EGD) WITH PROPOFOL ;  Surgeon: Brice Campi Albino Alu., MD;  Location: [facility];  Service: Gastroenterology;  Laterality: N/A;   HEMOSTASIS CLIP PLACEMENT  09/21/2020   Procedure: HEMOSTASIS CLIP PLACEMENT;  Surgeon: Normie Becton., MD;  Location: [facility];  Service: Gastroenterology;;   HEMOSTASIS CLIP PLACEMENT  10/11/2021   Procedure: HEMOSTASIS CLIP PLACEMENT;  Surgeon: Normie Becton., MD;  Location: [facility];  Service: Gastroenterology;;   NO PAST SURGERIES     POLYPECTOMY  10/11/2021   Procedure: POLYPECTOMY;  Surgeon: Normie Becton., MD;  Location: [facility];  Service: Gastroenterology;;   SUBMUCOSAL LIFTING INJECTION  09/21/2020    Procedure: SUBMUCOSAL LIFTING INJECTION;  Surgeon: Normie Becton., MD;  Location: [facility];  Service: Gastroenterology;;   SUBMUCOSAL LIFTING INJECTION  10/11/2021   Procedure: SUBMUCOSAL LIFTING INJECTION;  Surgeon: Normie Becton., MD;  Location: [facility];  Service: Gastroenterology;;   SUBMUCOSAL TATTOO INJECTION  10/11/2021   Procedure: SUBMUCOSAL TATTOO INJECTION;  Surgeon: Normie Becton., MD;  Location: [facility];  Service: Gastroenterology;;    ROS: Review of Systems Negative except as stated above  PHYSICAL EXAM: BP 100/64   Pulse (!) 54   Temp 97.8 F (36.6 C) (Oral)   Resp 16   Ht 5' (1.524 m)   Wt 140 lb (63.5 kg)   LMP 09/27/2012   SpO2 96%   BMI 27.34 kg/m   Physical Exam HENT:     Head: Normocephalic and atraumatic.     Nose: Nose normal.     Mouth/Throat:     Mouth: Mucous membranes are moist.     Pharynx: Oropharynx is clear.  Eyes:     Extraocular Movements: Extraocular movements intact.     Conjunctiva/sclera: Conjunctivae normal.     Pupils: Pupils are equal, round, and reactive to light.  Cardiovascular:     Rate and Rhythm: Bradycardia present.     Pulses: Normal pulses.     Heart sounds: Normal heart sounds.  Pulmonary:     Effort: Pulmonary effort is normal.     Breath sounds: Normal breath sounds.  Musculoskeletal:        General: Normal range of motion.     Cervical back: Normal range of motion and neck supple.  Neurological:     General: No focal deficit present.     Mental Status: She is alert and oriented to person, place, and time.  Psychiatric:        Mood and Affect: Mood normal.        Behavior: Behavior normal.     ASSESSMENT AND PLAN: 1. Primary hypertension (Primary) - Continue Amlodipine , Losartan , and Metoprolol  Tartrate as prescribed.  - Counseled on blood pressure goal of less than 130/80, low-sodium, DASH diet, medication compliance, and 150 minutes of moderate intensity exercise  per week as tolerated. Counseled on medication adherence and adverse effects. - Follow-up with primary provider in 3 months or sooner if needed.  - amLODipine  (NORVASC ) 10 MG tablet;  Take 1 tablet (10 mg total) by mouth daily.  Dispense: 90 tablet; Refill: 0 - losartan  (COZAAR ) 50 MG tablet; Take 1 tablet (50 mg total) by mouth daily.  Dispense: 90 tablet; Refill: 0 - metoprolol  tartrate (LOPRESSOR ) 25 MG tablet; Take 1/2 (one-half) tablet by mouth twice daily  Dispense: 90 tablet; Refill: 0  2. Hyperlipidemia, unspecified hyperlipidemia type - Continue Simvastatin  as prescribed. Counseled on medication adherence/adverse effects.  - Follow-up with primary provider in 3 months or sooner if needed. - simvastatin  (ZOCOR ) 40 MG tablet; Take 1 tablet (40 mg total) by mouth daily at 6 PM.  Dispense: 90 tablet; Refill: 0  3. Right shoulder pain, unspecified chronicity - Continue Gabapentin  as prescribed. Counseled on medication adherence/adverse effects.  - Follow-up with primary provider in 3 months or sooner if needed. - gabapentin  (NEURONTIN ) 300 MG capsule; Take 1 capsule (300 mg total) by mouth at bedtime.  Dispense: 90 capsule; Refill: 0  4. Immunization due - Administered. - Varicella-zoster vaccine IM    Patient was given the opportunity to ask questions.  Patient verbalized understanding of the plan and was able to repeat key elements of the plan. Patient was given clear instructions to go to Emergency Department or return to medical center if symptoms don't improve, worsen, or new problems develop.The patient verbalized understanding.   Orders Placed This Encounter  Procedures   Varicella-zoster vaccine IM     Requested Prescriptions   Signed Prescriptions Disp Refills   amLODipine  (NORVASC ) 10 MG tablet 90 tablet 0    Sig: Take 1 tablet (10 mg total) by mouth daily.   gabapentin  (NEURONTIN ) 300 MG capsule 90 capsule 0    Sig: Take 1 capsule (300 mg total) by mouth at bedtime.    losartan  (COZAAR ) 50 MG tablet 90 tablet 0    Sig: Take 1 tablet (50 mg total) by mouth daily.   metoprolol  tartrate (LOPRESSOR ) 25 MG tablet 90 tablet 0    Sig: Take 1/2 (one-half) tablet by mouth twice daily   simvastatin  (ZOCOR ) 40 MG tablet 90 tablet 0    Sig: Take 1 tablet (40 mg total) by mouth daily at 6 PM.    Return in about 3 months (around 10/17/2023) for Follow-Up or next available chronic conditions.  Senaida Dama, NP

## 2023-07-17 NOTE — Progress Notes (Signed)
 3 month follow.  Had a stomach virus 3 days ago went to urgent care.  Feeling better now

## 2023-08-22 ENCOUNTER — Other Ambulatory Visit: Payer: Self-pay | Admitting: Gastroenterology

## 2023-10-18 ENCOUNTER — Ambulatory Visit: Admitting: Family

## 2023-11-07 ENCOUNTER — Other Ambulatory Visit: Payer: Self-pay | Admitting: Family

## 2023-11-07 DIAGNOSIS — I10 Essential (primary) hypertension: Secondary | ICD-10-CM

## 2023-11-08 ENCOUNTER — Ambulatory Visit (HOSPITAL_COMMUNITY)
Admission: EM | Admit: 2023-11-08 | Discharge: 2023-11-08 | Disposition: A | Discharge status: Home / Self Care | Attending: Family Medicine | Admitting: Family Medicine

## 2023-11-08 ENCOUNTER — Other Ambulatory Visit: Payer: Self-pay | Admitting: Family

## 2023-11-08 ENCOUNTER — Encounter (HOSPITAL_COMMUNITY): Payer: Self-pay

## 2023-11-08 ENCOUNTER — Ambulatory Visit (INDEPENDENT_AMBULATORY_CARE_PROVIDER_SITE_OTHER)

## 2023-11-08 DIAGNOSIS — R051 Acute cough: Secondary | ICD-10-CM

## 2023-11-08 DIAGNOSIS — R062 Wheezing: Secondary | ICD-10-CM

## 2023-11-08 DIAGNOSIS — I10 Essential (primary) hypertension: Secondary | ICD-10-CM

## 2023-11-08 DIAGNOSIS — J9811 Atelectasis: Secondary | ICD-10-CM | POA: Diagnosis not present

## 2023-11-08 DIAGNOSIS — I700 Atherosclerosis of aorta: Secondary | ICD-10-CM | POA: Diagnosis not present

## 2023-11-08 DIAGNOSIS — R079 Chest pain, unspecified: Secondary | ICD-10-CM | POA: Diagnosis not present

## 2023-11-08 MED ORDER — HYDROCODONE BIT-HOMATROP MBR 5-1.5 MG/5ML PO SOLN: 5.0000 mL | Freq: Four times a day (QID) | ORAL | 0 refills | Status: AC | PRN

## 2023-11-08 MED ORDER — PREDNISONE 20 MG PO TABS: 40.0000 mg | ORAL_TABLET | Freq: Every day | ORAL | 0 refills | Status: AC

## 2023-11-08 NOTE — Telephone Encounter (Signed)
 Complete

## 2023-11-08 NOTE — ED Provider Notes (Signed)
 [facility]   [id number] 11/08/23 Arrival Time: 1049  ASSESSMENT & PLAN:  1. Acute cough   2. Wheezing    I have personally viewed and independently interpreted the imaging studies ordered this visit. CXR: no acute changes/infiltrates.  Discussed typical duration of likely viral illness. OTC symptom care as needed.  Meds ordered this encounter  Medications   HYDROcodone  bit-homatropine (HYCODAN) 5-1.5 MG/5ML syrup    Sig: Take 5 mLs by mouth every 6 (six) hours as needed for cough.    Dispense:  90 mL    Refill:  0   predniSONE  (DELTASONE ) 20 MG tablet    Sig: Take 2 tablets (40 mg total) by mouth daily.    Dispense:  10 tablet    Refill:  0     Follow-up Information     Lorren Greig PARAS, NP.   Specialty: Nurse Practitioner Why: If worsening or failing to improve as anticipated. Contact information: [address] [place] [address] [phone]                 Reviewed expectations re: course of current medical issues. Questions answered. Outlined signs and symptoms indicating need for more acute intervention. Understanding verbalized. After Visit Summary given.   SUBJECTIVE: History from: Patient. Katherine Henson is a 63 y.o. female. Pt present with a cough and sinus pressure x one week. She has tried OTC cough medicine, has not had any relief. Denies fever. States she has pain in her chest when coughing.  Cough is affecting sleep. Denies: fever. Normal PO intake without n/v/d.  OBJECTIVE:  Vitals:   11/08/23 1224  BP: 135/63  Pulse: (!) 55  Resp: 16  Temp: 98.6 F (37 C)  TempSrc: Oral  SpO2: 99%    General appearance: alert; no distress Eyes: PERRLA; EOMI; conjunctiva normal HENT: [place]; AT; with nasal congestion Neck: supple  Lungs: speaks full sentences without difficulty; unlabored; mild to moderate bilateral exp wheezing Extremities: no edema Skin: warm and dry Psychological: alert and cooperative; normal mood  and affect  Imaging: DG Chest 2 View Result Date: 11/08/2023 CLINICAL DATA:  Chest pain EXAM: CHEST - 2 VIEW COMPARISON:  Chest radiograph May 10, 2022 FINDINGS: The heart size and mediastinal contours are within normal limits. Calcification of aortic knob. Linear opacity in left mid hemithorax likely representing subsegmental platelike atelectasis versus scarring, stable to prior. Both lungs are otherwise clear. The visualized skeletal structures are unremarkable. IMPRESSION: Left mid hemithorax scarring/platelike atelectasis, stable. No new nodule or consolidation. Electronically Signed   By: Megan  Zare M.D.   On: 11/08/2023 14:03    Allergies  Allergen Reactions   Penicillins Anaphylaxis    Did it involve swelling of the face/tongue/throat, SOB, or low BP? No Did it involve sudden or severe rash/hives, skin peeling, or any reaction on the inside of your mouth or nose? Yes Did you need to seek medical attention at a hospital or doctor's office? No When did it last happen?  30 years ago     If all above answers are "NO", may proceed with cephalosporin use.    Past Medical History:  Diagnosis Date   Allergy    Arthritis    Cataract    bilateral removed   Chronic hepatitis C (HCC) 12/06/2018   treated for   Depression    GERD (gastroesophageal reflux disease)    HTN (hypertension)    Hyperlipidemia    Pre-diabetes    Stroke (HCC)  mini stroke 2021   Social History   Socioeconomic History   Marital status: Single    Spouse name: Not on file   Number of children: 5   Years of education: Not on file   Highest education level: Not on file  Occupational History   Occupation: housekeeper  Tobacco Use   Smoking status: Former    Current packs/day: 0.00    Average packs/day: 0.5 packs/day for 30.0 years (15.0 ttl pk-yrs)    Types: Cigarettes    Start date: 12/05/1980    Quit date: 12/06/2010    Years since quitting: 12.9    Passive exposure: Past   Smokeless tobacco: Never    Tobacco comments:    Quit again 2021  Vaping Use   Vaping status: Never Used  Substance and Sexual Activity   Alcohol use: Yes    Alcohol/week: 1.0 standard drink of alcohol    Types: 1 Glasses of wine per week    Comment: occasional   Drug use: Not Currently    Types: Marijuana   Sexual activity: Not Currently  Other Topics Concern   Not on file  Social History Narrative   Left handed   One story home   No caffeine   Social Drivers of Health   Financial Resource Strain: Medium Risk (04/18/2023)   Overall Financial Resource Strain (CARDIA)    Difficulty of Paying Living Expenses: Somewhat hard  Food Insecurity: Food Insecurity Present (02/03/2021)   Hunger Vital Sign    Worried About Running Out of Food in the Last Year: Often true    Ran Out of Food in the Last Year: Sometimes true  Transportation Needs: Unmet Transportation Needs (02/03/2021)   PRAPARE - Transportation    Lack of Transportation (Medical): Yes    Lack of Transportation (Non-Medical): Yes  Physical Activity: Sufficiently Active (04/18/2023)   Exercise Vital Sign    Days of Exercise per Week: 7 days    Minutes of Exercise per Session: 40 min  Stress: No Stress Concern Present (04/18/2023)   [employer] of Occupational Health - Occupational Stress Questionnaire    Feeling of Stress : Not at all  Social Connections: Moderately Integrated (04/18/2023)   Social Connection and Isolation Panel    Frequency of Communication with Friends and Family: More than three times a week    Frequency of Social Gatherings with Friends and Family: Three times a week    Attends Religious Services: More than 4 times per year    Active Member of Clubs or Organizations: Yes    Attends Banker Meetings: More than 4 times per year    Marital Status: Never married  Intimate Partner Violence: Not At Risk (07/17/2023)   Humiliation, Afraid, Rape, and Kick questionnaire    Fear of Current or Ex-Partner: No     Emotionally Abused: No    Physically Abused: No    Sexually Abused: No   Family History  Problem Relation Age of Onset   Stroke Mother    Anuerysm Mother        brain   Diabetes Sister    Breast cancer Sister    Breast cancer Sister    Diabetes Brother    Prostate cancer Brother    Breast cancer Niece    Breast cancer Niece    Colon cancer Neg Hx    Esophageal cancer Neg Hx    Liver disease Neg Hx    Stomach cancer Neg Hx    Pancreatic cancer  Neg Hx    Inflammatory bowel disease Neg Hx    Rectal cancer Neg Hx    Colon polyps Neg Hx    Crohn's disease Neg Hx    Ulcerative colitis Neg Hx    Past Surgical History:  Procedure Laterality Date   BIOPSY  01/26/2023   Procedure: BIOPSY;  Surgeon: Wilhelmenia Aloha Raddle., MD;  Location: [facility];  Service: Gastroenterology;;   COLONOSCOPY     ENDOSCOPIC MUCOSAL RESECTION  09/21/2020   Procedure: ENDOSCOPIC MUCOSAL RESECTION;  Surgeon: Wilhelmenia Aloha Raddle., MD;  Location: [facility];  Service: Gastroenterology;;   ENDOSCOPIC MUCOSAL RESECTION N/A 10/11/2021   Procedure: ENDOSCOPIC MUCOSAL RESECTION;  Surgeon: Wilhelmenia Aloha Raddle., MD;  Location: [facility];  Service: Gastroenterology;  Laterality: N/A;   ESOPHAGOGASTRODUODENOSCOPY (EGD) WITH PROPOFOL  N/A 09/21/2020   Procedure: ESOPHAGOGASTRODUODENOSCOPY (EGD) WITH PROPOFOL ;  Surgeon: Wilhelmenia Aloha Raddle., MD;  Location: [facility];  Service: Gastroenterology;  Laterality: N/A;   ESOPHAGOGASTRODUODENOSCOPY (EGD) WITH PROPOFOL  N/A 10/11/2021   Procedure: ESOPHAGOGASTRODUODENOSCOPY (EGD) WITH PROPOFOL ;  Surgeon: Wilhelmenia Aloha Raddle., MD;  Location: [facility];  Service: Gastroenterology;  Laterality: N/A;   ESOPHAGOGASTRODUODENOSCOPY (EGD) WITH PROPOFOL  N/A 01/26/2023   Procedure: ESOPHAGOGASTRODUODENOSCOPY (EGD) WITH PROPOFOL ;  Surgeon: Wilhelmenia Aloha Raddle., MD;  Location: [facility];  Service: Gastroenterology;  Laterality: N/A;   HEMOSTASIS CLIP PLACEMENT   09/21/2020   Procedure: HEMOSTASIS CLIP PLACEMENT;  Surgeon: Wilhelmenia Aloha Raddle., MD;  Location: [facility];  Service: Gastroenterology;;   HEMOSTASIS CLIP PLACEMENT  10/11/2021   Procedure: HEMOSTASIS CLIP PLACEMENT;  Surgeon: Wilhelmenia Aloha Raddle., MD;  Location: [facility];  Service: Gastroenterology;;   NO PAST SURGERIES     POLYPECTOMY  10/11/2021   Procedure: POLYPECTOMY;  Surgeon: Wilhelmenia Aloha Raddle., MD;  Location: [facility];  Service: Gastroenterology;;   SUBMUCOSAL LIFTING INJECTION  09/21/2020   Procedure: SUBMUCOSAL LIFTING INJECTION;  Surgeon: Wilhelmenia Aloha Raddle., MD;  Location: [facility];  Service: Gastroenterology;;   SUBMUCOSAL LIFTING INJECTION  10/11/2021   Procedure: SUBMUCOSAL LIFTING INJECTION;  Surgeon: Wilhelmenia Aloha Raddle., MD;  Location: [facility];  Service: Gastroenterology;;   SUBMUCOSAL TATTOO INJECTION  10/11/2021   Procedure: SUBMUCOSAL TATTOO INJECTION;  Surgeon: Wilhelmenia Aloha Raddle., MD;  Location: [facility];  Service: Gastroenterology;;     Rolinda Rogue, MD 11/08/23 [phone]

## 2023-11-08 NOTE — Discharge Instructions (Signed)
 Be aware, your cough medication may cause drowsiness. Please do not drive, operate heavy machinery or make important decisions while on this medication, it can cloud your judgement.

## 2023-11-08 NOTE — ED Triage Notes (Signed)
 Pt present with a cough and sinus pressure x one week. She has tried OTC cough medicine, has not had any relief. Denies fever. States she has pain in her chest when coughing.

## 2023-11-13 ENCOUNTER — Ambulatory Visit: Admitting: Family

## 2023-11-19 ENCOUNTER — Other Ambulatory Visit: Payer: Self-pay | Admitting: Gastroenterology

## 2023-11-22 ENCOUNTER — Other Ambulatory Visit: Payer: Self-pay | Admitting: Family

## 2023-11-22 DIAGNOSIS — I10 Essential (primary) hypertension: Secondary | ICD-10-CM

## 2023-11-27 ENCOUNTER — Encounter: Payer: Self-pay | Admitting: Family

## 2023-11-27 ENCOUNTER — Ambulatory Visit: Admitting: Family

## 2023-11-27 VITALS — BP 117/75 | HR 62 | Temp 98.2°F | Resp 16 | Ht 60.0 in | Wt 141.4 lb

## 2023-11-27 DIAGNOSIS — R7303 Prediabetes: Secondary | ICD-10-CM

## 2023-11-27 DIAGNOSIS — M25511 Pain in right shoulder: Secondary | ICD-10-CM | POA: Diagnosis not present

## 2023-11-27 DIAGNOSIS — E785 Hyperlipidemia, unspecified: Secondary | ICD-10-CM

## 2023-11-27 DIAGNOSIS — I10 Essential (primary) hypertension: Secondary | ICD-10-CM

## 2023-11-27 DIAGNOSIS — Z23 Encounter for immunization: Secondary | ICD-10-CM

## 2023-11-27 MED ORDER — AMLODIPINE BESYLATE 10 MG PO TABS: 10.0000 mg | ORAL_TABLET | Freq: Every day | ORAL | 0 refills | Status: DC

## 2023-11-27 MED ORDER — METOPROLOL TARTRATE 25 MG PO TABS: ORAL_TABLET | ORAL | 0 refills | Status: DC

## 2023-11-27 MED ORDER — SIMVASTATIN 40 MG PO TABS: 40.0000 mg | ORAL_TABLET | Freq: Every day | ORAL | 0 refills | Status: DC

## 2023-11-27 MED ORDER — GABAPENTIN 300 MG PO CAPS: 300.0000 mg | ORAL_CAPSULE | Freq: Every day | ORAL | 0 refills | Status: DC

## 2023-11-27 MED ORDER — LOSARTAN POTASSIUM 50 MG PO TABS: 50.0000 mg | ORAL_TABLET | Freq: Every day | ORAL | 0 refills | Status: AC

## 2023-11-27 NOTE — Progress Notes (Signed)
 Patient ID: Katherine Henson, female    DOB: 07-22-1960  MRN: [id number]  CC: Chronic Conditions Follow-Up  Subjective: Katherine Henson is a 63 y.o. female who presents for chronic conditions follow-up.   Her concerns today include:  - Doing well on Amlodipine , Losartan , and Metoprolol  Tartrate, no issues/concerns. She does not complain of red flag symptoms such as but not limited to chest pain, shortness of breath, worst headache of life, nausea/vomiting.  - Doing well on Simvastatin , no issues/concerns.  - Prediabetes. - Doing well on Gabapentin , no issues/concerns.   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 30 tablet 0   Azelastine -Fluticasone  (DYMISTA ) 137-50 MCG/ACT SUSP Place 1 spray into the nose every 12 (twelve) hours. 23 g 2   cetirizine  (ZYRTEC  ALLERGY) 10 MG tablet Take 1 tablet (10 mg total) by mouth at bedtime. 90 tablet 1   HYDROcodone  bit-homatropine (HYCODAN) 5-1.5 MG/5ML syrup Take 5 mLs by mouth every 6 (six) hours as needed for cough. 90 mL 0   losartan  (COZAAR ) 50 MG tablet Take 1 tablet (50 mg total) by mouth daily. 90 tablet 0   ondansetron  (ZOFRAN -ODT) 4 MG disintegrating tablet Take 1 tablet (4 mg total) by mouth every 8 (eight) hours  as needed for nausea or vomiting. 20 tablet 0   pantoprazole  (PROTONIX ) 40 MG tablet Take 1 tablet by mouth twice daily 180 tablet 0   predniSONE  (DELTASONE ) 20 MG tablet Take 2 tablets (40 mg total) by mouth daily. 10 tablet 0   promethazine -dextromethorphan (PROMETHAZINE -DM) 6.25-15 MG/5ML syrup Take 5 mLs by mouth 3 (three) times daily as needed for cough. 240 mL 0   No current facility-administered medications on file prior to visit.    Allergies  Allergen Reactions   Penicillins Anaphylaxis    Did it involve swelling of the face/tongue/throat, SOB, or low BP? No Did it involve sudden or severe rash/hives, skin peeling, or any reaction on the inside of your mouth or nose? Yes Did you need to seek medical attention at a hospital or doctor's office? No When did it last happen?  30 years ago     If all above answers are "NO", may proceed with cephalosporin use.    Social History   Socioeconomic History   Marital status: Single    Spouse name: Not on file   Number of children: 5   Years of education: Not on file   Highest education level: Not on file  Occupational History   Occupation: housekeeper  Tobacco Use   Smoking status: Former    Current packs/day: 0.00    Average packs/day: 0.5 packs/day for 30.0 years (15.0 ttl pk-yrs)    Types: Cigarettes    Start date: 12/05/1980    Quit date: 12/06/2010    Years since quitting: 12.9  Passive exposure: Past   Smokeless tobacco: Never   Tobacco comments:    Quit again 2021  Vaping Use   Vaping status: Never Used  Substance and Sexual Activity   Alcohol use: Yes    Alcohol/week: 1.0 standard drink of alcohol    Types: 1 Glasses of wine per week    Comment: occasional   Drug use: Not Currently    Types: Marijuana   Sexual activity: Not Currently  Other Topics Concern   Not on file  Social History Narrative   Left handed   One story home   No caffeine   Social Drivers of Health   Financial Resource Strain: Medium  Risk (04/18/2023)   Overall Financial Resource Strain (CARDIA)    Difficulty of Paying Living Expenses: Somewhat hard  Food Insecurity: Food Insecurity Present (02/03/2021)   Hunger Vital Sign    Worried About Running Out of Food in the Last Year: Often true    Ran Out of Food in the Last Year: Sometimes true  Transportation Needs: Unmet Transportation Needs (02/03/2021)   PRAPARE - Transportation    Lack of Transportation (Medical): Yes    Lack of Transportation (Non-Medical): Yes  Physical Activity: Sufficiently Active (04/18/2023)   Exercise Vital Sign    Days of Exercise per Week: 7 days    Minutes of Exercise per Session: 40 min  Stress: No Stress Concern Present (04/18/2023)   [employer] of Occupational Health - Occupational Stress Questionnaire    Feeling of Stress : Not at all  Social Connections: Moderately Integrated (04/18/2023)   Social Connection and Isolation Panel    Frequency of Communication with Friends and Family: More than three times a week    Frequency of Social Gatherings with Friends and Family: Three times a week    Attends Religious Services: More than 4 times per year    Active Member of Clubs or Organizations: Yes    Attends Banker Meetings: More than 4 times per year    Marital Status: Never married  Intimate Partner Violence: Not At Risk (07/17/2023)   Humiliation, Afraid, Rape, and Kick questionnaire    Fear of Current or Ex-Partner: No    Emotionally Abused: No    Physically Abused: No    Sexually Abused: No    Family History  Problem Relation Age of Onset   Stroke Mother    Anuerysm Mother        brain   Diabetes Sister    Breast cancer Sister    Breast cancer Sister    Diabetes Brother    Prostate cancer Brother    Breast cancer Niece    Breast cancer Niece    Colon cancer Neg Hx    Esophageal cancer Neg Hx    Liver disease Neg Hx    Stomach cancer Neg Hx    Pancreatic cancer Neg Hx    Inflammatory bowel disease Neg  Hx    Rectal cancer Neg Hx    Colon polyps Neg Hx    Crohn's disease Neg Hx    Ulcerative colitis Neg Hx     Past Surgical History:  Procedure Laterality Date   BIOPSY  01/26/2023   Procedure: BIOPSY;  Surgeon: Wilhelmenia, Aloha Raddle., MD;  Location: [facility];  Service: Gastroenterology;;   COLONOSCOPY     ENDOSCOPIC MUCOSAL RESECTION  09/21/2020   Procedure: ENDOSCOPIC MUCOSAL RESECTION;  Surgeon: Wilhelmenia Aloha Raddle., MD;  Location: [facility];  Service: Gastroenterology;;   ENDOSCOPIC MUCOSAL  RESECTION N/A 10/11/2021   Procedure: ENDOSCOPIC MUCOSAL RESECTION;  Surgeon: Wilhelmenia Aloha Raddle., MD;  Location: [facility];  Service: Gastroenterology;  Laterality: N/A;   ESOPHAGOGASTRODUODENOSCOPY (EGD) WITH PROPOFOL  N/A 09/21/2020   Procedure: ESOPHAGOGASTRODUODENOSCOPY (EGD) WITH PROPOFOL ;  Surgeon: Wilhelmenia Aloha Raddle., MD;  Location: [facility];  Service: Gastroenterology;  Laterality: N/A;   ESOPHAGOGASTRODUODENOSCOPY (EGD) WITH PROPOFOL  N/A 10/11/2021   Procedure: ESOPHAGOGASTRODUODENOSCOPY (EGD) WITH PROPOFOL ;  Surgeon: Wilhelmenia Aloha Raddle., MD;  Location: [facility];  Service: Gastroenterology;  Laterality: N/A;   ESOPHAGOGASTRODUODENOSCOPY (EGD) WITH PROPOFOL  N/A 01/26/2023   Procedure: ESOPHAGOGASTRODUODENOSCOPY (EGD) WITH PROPOFOL ;  Surgeon: Wilhelmenia Aloha Raddle., MD;  Location: [facility];  Service: Gastroenterology;  Laterality: N/A;   HEMOSTASIS CLIP PLACEMENT  09/21/2020   Procedure: HEMOSTASIS CLIP PLACEMENT;  Surgeon: Wilhelmenia Aloha Raddle., MD;  Location: [facility];  Service: Gastroenterology;;   HEMOSTASIS CLIP PLACEMENT  10/11/2021   Procedure: HEMOSTASIS CLIP PLACEMENT;  Surgeon: Wilhelmenia Aloha Raddle., MD;  Location: [facility];  Service: Gastroenterology;;   NO PAST SURGERIES     POLYPECTOMY  10/11/2021   Procedure: POLYPECTOMY;  Surgeon: Wilhelmenia Aloha Raddle., MD;  Location: [facility];  Service: Gastroenterology;;   SUBMUCOSAL  LIFTING INJECTION  09/21/2020   Procedure: SUBMUCOSAL LIFTING INJECTION;  Surgeon: Wilhelmenia Aloha Raddle., MD;  Location: [facility];  Service: Gastroenterology;;   SUBMUCOSAL LIFTING INJECTION  10/11/2021   Procedure: SUBMUCOSAL LIFTING INJECTION;  Surgeon: Wilhelmenia Aloha Raddle., MD;  Location: [facility];  Service: Gastroenterology;;   SUBMUCOSAL TATTOO INJECTION  10/11/2021   Procedure: SUBMUCOSAL TATTOO INJECTION;  Surgeon: Wilhelmenia Aloha Raddle., MD;  Location: [facility];  Service: Gastroenterology;;    ROS: Review of Systems Negative except as stated above  PHYSICAL EXAM: BP 117/75   Pulse 62   Temp 98.2 F (36.8 C) (Oral)   Resp 16   Ht 5' (1.524 m)   Wt 141 lb 6.4 oz (64.1 kg)   LMP 09/27/2012   SpO2 95%   BMI 27.62 kg/m   Physical Exam HENT:     Head: Normocephalic and atraumatic.     Nose: Nose normal.     Mouth/Throat:     Mouth: Mucous membranes are moist.     Pharynx: Oropharynx is clear.  Eyes:     Extraocular Movements: Extraocular movements intact.     Conjunctiva/sclera: Conjunctivae normal.     Pupils: Pupils are equal, round, and reactive to light.  Cardiovascular:     Rate and Rhythm: Normal rate and regular rhythm.     Pulses: Normal pulses.     Heart sounds: Normal heart sounds.  Pulmonary:     Effort: Pulmonary effort is normal.     Breath sounds: Normal breath sounds.  Musculoskeletal:        General: Normal range of motion.     Cervical back: Normal range of motion and neck supple.  Neurological:     General: No focal deficit present.     Mental Status: She is alert and oriented to person, place, and time.  Psychiatric:        Mood and Affect: Mood normal.        Behavior: Behavior normal.     ASSESSMENT AND PLAN: 1. Primary hypertension (Primary) - Continue Amlodipine , Losartan , and Metoprolol  Tartrate as prescribed.  - Routine screening.  - Counseled on blood pressure goal of less than 130/80, low-sodium, DASH diet,  medication compliance, and 150 minutes of moderate intensity exercise per week as tolerated. Counseled on medication adherence and adverse effects. -  Follow-up with primary provider in 3 months or sooner if needed.  - amLODipine  (NORVASC ) 10 MG tablet; Take 1 tablet (10 mg total) by mouth daily.  Dispense: 90 tablet; Refill: 0 - losartan  (COZAAR ) 50 MG tablet; Take 1 tablet (50 mg total) by mouth daily.  Dispense: 90 tablet; Refill: 0 - metoprolol  tartrate (LOPRESSOR ) 25 MG tablet; Take 1/2 (one-half) tablet by mouth twice daily  Dispense: 90 tablet; Refill: 0 - Basic Metabolic Panel  2. Hyperlipidemia, unspecified hyperlipidemia type - Continue Simvastatin  as prescribed. Counseled on medication adherence/adverse effects.  - Follow-up with primary provider in 3 months or sooner if needed.  - simvastatin  (ZOCOR ) 40 MG tablet; Take 1 tablet (40 mg total) by mouth daily at 6 PM.  Dispense: 90 tablet; Refill: 0  3. Prediabetes - Routine screening.  - Hemoglobin A1c  4. Right shoulder pain, unspecified chronicity - Continue Gabapentin  as prescribed. Counseled on medication adherence/adverse effects.  - Follow-up with primary provider in 3 months or sooner if needed.  - gabapentin  (NEURONTIN ) 300 MG capsule; Take 1 capsule (300 mg total) by mouth at bedtime.  Dispense: 90 capsule; Refill: 0  5. Immunization due - Administered.  - Flu vaccine trivalent PF, 6mos and older(Flulaval,Afluria,Fluarix,Fluzone)   Patient was given the opportunity to ask questions.  Patient verbalized understanding of the plan and was able to repeat key elements of the plan. Patient was given clear instructions to go to Emergency Department or return to medical center if symptoms don't improve, worsen, or new problems develop.The patient verbalized understanding.   Orders Placed This Encounter  Procedures   Flu vaccine trivalent PF, 6mos and older(Flulaval,Afluria,Fluarix,Fluzone)   Basic Metabolic Panel    Hemoglobin A1c     Requested Prescriptions   Signed Prescriptions Disp Refills   amLODipine  (NORVASC ) 10 MG tablet 90 tablet 0    Sig: Take 1 tablet (10 mg total) by mouth daily.   gabapentin  (NEURONTIN ) 300 MG capsule 90 capsule 0    Sig: Take 1 capsule (300 mg total) by mouth at bedtime.   losartan  (COZAAR ) 50 MG tablet 90 tablet 0    Sig: Take 1 tablet (50 mg total) by mouth daily.   metoprolol  tartrate (LOPRESSOR ) 25 MG tablet 90 tablet 0    Sig: Take 1/2 (one-half) tablet by mouth twice daily   simvastatin  (ZOCOR ) 40 MG tablet 90 tablet 0    Sig: Take 1 tablet (40 mg total) by mouth daily at 6 PM.    Return in about 3 months (around 02/26/2024) for Follow-Up or next available chronic conditions.  Greig JINNY Drones, NP

## 2023-11-27 NOTE — Progress Notes (Signed)
 3 month follow-up

## 2023-11-28 ENCOUNTER — Ambulatory Visit: Payer: Self-pay | Admitting: Family

## 2023-11-28 LAB — HEMOGLOBIN A1C
Est. average glucose Bld gHb Est-mCnc: 117 mg/dL
Hgb A1c MFr Bld: 5.7 % — ABNORMAL HIGH (ref 4.8–5.6)

## 2023-11-28 LAB — BASIC METABOLIC PANEL WITH GFR
BUN/Creatinine Ratio: 15 (ref 12–28)
BUN: 13 mg/dL (ref 8–27)
CO2: 17 mmol/L — ABNORMAL LOW (ref 20–29)
Calcium: 9.7 mg/dL (ref 8.7–10.3)
Chloride: 109 mmol/L — ABNORMAL HIGH (ref 96–106)
Creatinine, Ser: 0.86 mg/dL (ref 0.57–1.00)
Glucose: 82 mg/dL (ref 70–99)
Potassium: 4.5 mmol/L (ref 3.5–5.2)
Sodium: 145 mmol/L — ABNORMAL HIGH (ref 134–144)
eGFR: 76 mL/min/1.73 (ref >59)

## 2024-02-15 ENCOUNTER — Other Ambulatory Visit: Payer: Self-pay | Admitting: Gastroenterology

## 2024-02-26 ENCOUNTER — Encounter: Payer: Self-pay | Admitting: Family

## 2024-02-26 ENCOUNTER — Ambulatory Visit: Admitting: Family

## 2024-02-26 VITALS — BP 124/73 | HR 58 | Temp 98.2°F | Resp 16 | Ht 60.0 in | Wt 140.8 lb

## 2024-02-26 DIAGNOSIS — M25511 Pain in right shoulder: Secondary | ICD-10-CM

## 2024-02-26 DIAGNOSIS — I10 Essential (primary) hypertension: Secondary | ICD-10-CM

## 2024-02-26 DIAGNOSIS — E785 Hyperlipidemia, unspecified: Secondary | ICD-10-CM | POA: Diagnosis not present

## 2024-02-26 MED ORDER — AMLODIPINE BESYLATE 10 MG PO TABS: 10.0000 mg | ORAL_TABLET | Freq: Every day | ORAL | 0 refills | Status: AC

## 2024-02-26 MED ORDER — SIMVASTATIN 40 MG PO TABS: 40.0000 mg | ORAL_TABLET | Freq: Every day | ORAL | 0 refills | Status: AC

## 2024-02-26 MED ORDER — GABAPENTIN 300 MG PO CAPS: 300.0000 mg | ORAL_CAPSULE | Freq: Every day | ORAL | 0 refills | Status: AC

## 2024-02-26 MED ORDER — METOPROLOL TARTRATE 25 MG PO TABS: ORAL_TABLET | ORAL | 0 refills | Status: AC

## 2024-02-26 MED ORDER — LOSARTAN POTASSIUM 50 MG PO TABS: 50.0000 mg | ORAL_TABLET | Freq: Every day | ORAL | 0 refills | Status: AC

## 2024-02-26 NOTE — Progress Notes (Signed)
 "   Patient ID: Katherine Henson, female    DOB: 1960-06-17  MRN: [id number]  CC: Chronic Conditions Follow-Up  Subjective: Katherine Henson is a 63 y.o. female who presents for chronic conditions follow-up.   Her concerns today include:  - Doing well on Amlodipine , Losartan , and Metoprolol  Tartrate, no issues/concerns. She does not complain of red flag symptoms such as but not limited to chest pain, shortness of breath, worst headache of life, nausea/vomiting.  - Doing well on Simvastatin , no issues/concerns.  - Doing well on Gabapentin , no issues/concerns.   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   Helicobacter pylori infection 07/22/2020   Esophagitis determined by endoscopy 07/22/2020   History of cerebellar stroke 11/26/2019   Mixed hyperlipidemia 11/26/2019   Positive double stranded DNA antibody test 04/10/2019   Chronic hepatitis C without hepatic coma (HCC) 12/06/2018   HTN (hypertension) 04/30/2018     Medications Ordered Prior to Encounter[1]  Allergies[2]  Social History   Socioeconomic History   Marital status: Single    Spouse name: Not on file   Number of children: 5   Years of education: Not on file   Highest education level: Not on file  Occupational History   Occupation: housekeeper  Tobacco Use   Smoking status: Former    Current packs/day: 0.00    Average packs/day: 0.5 packs/day for 30.0 years (15.0 ttl pk-yrs)    Types: Cigarettes    Start date: 12/05/1980    Quit date: 12/06/2010    Years since quitting: 13.2    Passive exposure: Past   Smokeless tobacco: Never   Tobacco comments:    Quit again 2021  Vaping Use   Vaping status: Never Used  Substance and Sexual Activity   Alcohol use: Yes     Alcohol/week: 1.0 standard drink of alcohol    Types: 1 Glasses of wine per week    Comment: occasional   Drug use: Not Currently    Types: Marijuana   Sexual activity: Not Currently  Other Topics Concern   Not on file  Social History Narrative   Left handed   One story home   No caffeine   Social Drivers of Health   Tobacco Use: Medium Risk (02/26/2024)   Patient History    Smoking Tobacco Use: Former    Smokeless Tobacco Use: Never    Passive Exposure: Past  Physicist, Medical Strain: Medium Risk (04/18/2023)   Overall Financial Resource Strain (CARDIA)    Difficulty of Paying Living Expenses: Somewhat hard  Food Insecurity: No Food Insecurity (02/26/2024)   Epic    Worried About Programme Researcher, Broadcasting/film/video in the Last Year: Never true    Ran Out of Food in the Last Year: Never true  Transportation Needs: Not on file  Physical Activity: Sufficiently Active (04/18/2023)   Exercise Vital Sign    Days of Exercise per Week: 7 days    Minutes of Exercise per Session: 40 min  Stress: No Stress Concern Present (04/18/2023)   [employer] of Occupational Health - Occupational Stress Questionnaire    Feeling of Stress : Not at all  Social Connections: Moderately Integrated (04/18/2023)   Social Connection and Isolation Panel    Frequency of Communication with Friends and Family: More than three  times a week    Frequency of Social Gatherings with Friends and Family: Three times a week    Attends Religious Services: More than 4 times per year    Active Member of Clubs or Organizations: Yes    Attends Banker Meetings: More than 4 times per year    Marital Status: Never married  Intimate Partner Violence: Not At Risk (07/17/2023)   Humiliation, Afraid, Rape, and Kick questionnaire    Fear of Current or Ex-Partner: No    Emotionally Abused: No    Physically Abused: No    Sexually Abused: No  Depression (PHQ2-9): Low Risk (02/26/2024)   Depression (PHQ2-9)    PHQ-2 Score:  0  Alcohol Screen: Low Risk (04/18/2023)   Alcohol Screen    Last Alcohol Screening Score (AUDIT): 1  Housing: Unknown (02/26/2024)   Epic    Unable to Pay for Housing in the Last Year: No    Number of Times Moved in the Last Year: Not on file    Homeless in the Last Year: No  Utilities: Not At Risk (07/17/2023)   [employer] Utilities    Threatened with loss of utilities: No  Health Literacy: Adequate Health Literacy (04/18/2023)   B1300 Health Literacy    Frequency of need for help with medical instructions: Never    Family History  Problem Relation Age of Onset   Stroke Mother    Anuerysm Mother        brain   Diabetes Sister    Breast cancer Sister    Breast cancer Sister    Diabetes Brother    Prostate cancer Brother    Breast cancer Niece    Breast cancer Niece    Colon cancer Neg Hx    Esophageal cancer Neg Hx    Liver disease Neg Hx    Stomach cancer Neg Hx    Pancreatic cancer Neg Hx    Inflammatory bowel disease Neg Hx    Rectal cancer Neg Hx    Colon polyps Neg Hx    Crohn's disease Neg Hx    Ulcerative colitis Neg Hx     Past Surgical History:  Procedure Laterality Date   BIOPSY  01/26/2023   Procedure: BIOPSY;  Surgeon: Wilhelmenia, Aloha Raddle., MD;  Location: [facility];  Service: Gastroenterology;;   COLONOSCOPY     ENDOSCOPIC MUCOSAL RESECTION  09/21/2020   Procedure: ENDOSCOPIC MUCOSAL RESECTION;  Surgeon: Wilhelmenia Aloha Raddle., MD;  Location: [facility];  Service: Gastroenterology;;   ENDOSCOPIC MUCOSAL RESECTION N/A 10/11/2021   Procedure: ENDOSCOPIC MUCOSAL RESECTION;  Surgeon: Wilhelmenia Aloha Raddle., MD;  Location: [facility];  Service: Gastroenterology;  Laterality: N/A;   ESOPHAGOGASTRODUODENOSCOPY (EGD) WITH PROPOFOL  N/A 09/21/2020   Procedure: ESOPHAGOGASTRODUODENOSCOPY (EGD) WITH PROPOFOL ;  Surgeon: Wilhelmenia Aloha Raddle., MD;  Location: [facility];  Service: Gastroenterology;  Laterality: N/A;   ESOPHAGOGASTRODUODENOSCOPY (EGD) WITH  PROPOFOL  N/A 10/11/2021   Procedure: ESOPHAGOGASTRODUODENOSCOPY (EGD) WITH PROPOFOL ;  Surgeon: Wilhelmenia Aloha Raddle., MD;  Location: [facility];  Service: Gastroenterology;  Laterality: N/A;   ESOPHAGOGASTRODUODENOSCOPY (EGD) WITH PROPOFOL  N/A 01/26/2023   Procedure: ESOPHAGOGASTRODUODENOSCOPY (EGD) WITH PROPOFOL ;  Surgeon: Wilhelmenia Aloha Raddle., MD;  Location: [facility];  Service: Gastroenterology;  Laterality: N/A;   HEMOSTASIS CLIP PLACEMENT  09/21/2020   Procedure: HEMOSTASIS CLIP PLACEMENT;  Surgeon: Wilhelmenia Aloha Raddle., MD;  Location: [facility];  Service: Gastroenterology;;   HEMOSTASIS CLIP PLACEMENT  10/11/2021   Procedure: HEMOSTASIS CLIP PLACEMENT;  Surgeon: Wilhelmenia Aloha Raddle., MD;  Location: [facility];  Service: Gastroenterology;;   NO PAST SURGERIES     POLYPECTOMY  10/11/2021   Procedure: POLYPECTOMY;  Surgeon: Mansouraty, Aloha Raddle., MD;  Location: [facility];  Service: Gastroenterology;;   ROBLEY LIFTING INJECTION  09/21/2020   Procedure: SUBMUCOSAL LIFTING INJECTION;  Surgeon: Wilhelmenia Aloha Raddle., MD;  Location: [facility];  Service: Gastroenterology;;   ROBLEY LIFTING INJECTION  10/11/2021   Procedure: SUBMUCOSAL LIFTING INJECTION;  Surgeon: Wilhelmenia Aloha Raddle., MD;  Location: [facility];  Service: Gastroenterology;;   SUBMUCOSAL TATTOO INJECTION  10/11/2021   Procedure: SUBMUCOSAL TATTOO INJECTION;  Surgeon: Wilhelmenia Aloha Raddle., MD;  Location: [facility];  Service: Gastroenterology;;    ROS: Review of Systems Negative except as stated above  PHYSICAL EXAM: BP 124/73   Pulse (!) 58   Temp 98.2 F (36.8 C) (Oral)   Resp 16   Ht 5' (1.524 m)   Wt 140 lb 12.8 oz (63.9 kg)   LMP 09/27/2012   SpO2 98%   BMI 27.50 kg/m   Physical Exam HENT:     Head: Normocephalic and atraumatic.     Nose: Nose normal.     Mouth/Throat:     Mouth: Mucous membranes are moist.     Pharynx: Oropharynx is clear.  Eyes:      Extraocular Movements: Extraocular movements intact.     Conjunctiva/sclera: Conjunctivae normal.     Pupils: Pupils are equal, round, and reactive to light.  Cardiovascular:     Rate and Rhythm: Bradycardia present.     Pulses: Normal pulses.     Heart sounds: Normal heart sounds.  Pulmonary:     Effort: Pulmonary effort is normal.     Breath sounds: Normal breath sounds.  Musculoskeletal:        General: Normal range of motion.     Cervical back: Normal range of motion and neck supple.  Neurological:     General: No focal deficit present.     Mental Status: She is alert and oriented to person, place, and time.  Psychiatric:        Mood and Affect: Mood normal.        Behavior: Behavior normal.    ASSESSMENT AND PLAN: 1. Primary hypertension (Primary) - Continue Amlodipine , Losartan , and Metoprolol  Tartrate as prescribed.  - Counseled on blood pressure goal of less than 130/80, low-sodium, DASH diet, medication compliance, and 150 minutes of moderate intensity exercise per week as tolerated. Counseled on medication adherence and adverse effects. - Follow-up with primary provider in 3 months or sooner if needed.  - amLODipine  (NORVASC ) 10 MG tablet; Take 1 tablet (10 mg total) by mouth daily.  Dispense: 90 tablet; Refill: 0 - losartan  (COZAAR ) 50 MG tablet; Take 1 tablet (50 mg total) by mouth daily.  Dispense: 90 tablet; Refill: 0 - metoprolol  tartrate (LOPRESSOR ) 25 MG tablet; Take 1/2 (one-half) tablet by mouth twice daily  Dispense: 90 tablet; Refill: 0  2. Hyperlipidemia, unspecified hyperlipidemia type - Continue Simvastatin  as prescribed. Counseled on medication adherence/adverse effects.  - Follow-up with primary provider in 3 months or sooner if needed.  - simvastatin  (ZOCOR ) 40 MG tablet; Take 1 tablet (40 mg total) by mouth daily at 6 PM.  Dispense: 90 tablet; Refill: 0  3. Right shoulder pain, unspecified chronicity - Continue Gabapentin  as prescribed. Counseled on  medication adherence/adverse effects.  - Follow-up with primary provider in 3 months or sooner if needed. - gabapentin  (NEURONTIN ) 300 MG capsule; Take 1 capsule (300 mg total) by mouth at bedtime.  Dispense: 90 capsule; Refill: 0   Patient was given the opportunity to ask questions.  Patient verbalized understanding of the plan and was able to repeat key elements of the plan. Patient was given clear instructions to go to Emergency Department or return to medical center if symptoms don't improve, worsen, or new problems develop.The patient verbalized understanding.   Requested Prescriptions   Signed Prescriptions Disp Refills   amLODipine  (NORVASC ) 10 MG tablet 90 tablet 0    Sig: Take 1 tablet (10 mg total) by mouth daily.   gabapentin  (NEURONTIN ) 300 MG capsule 90 capsule 0    Sig: Take 1 capsule (300 mg total) by mouth at bedtime.   losartan  (COZAAR ) 50 MG tablet 90 tablet 0    Sig: Take 1 tablet (50 mg total) by mouth daily.   metoprolol  tartrate (LOPRESSOR ) 25 MG tablet 90 tablet 0    Sig: Take 1/2 (one-half) tablet by mouth twice daily   simvastatin  (ZOCOR ) 40 MG tablet 90 tablet 0    Sig: Take 1 tablet (40 mg total) by mouth daily at 6 PM.    Return in about 3 months (around 05/26/2024) for Follow-Up or next available chronic conditions.  Greig JINNY Chute, NP      [1]  Current Outpatient Medications on File Prior to Visit  Medication Sig Dispense Refill   aspirin  81 MG chewable tablet Chew 1 tablet (81 mg total) by mouth daily. 30 tablet 0   Azelastine -Fluticasone  (DYMISTA ) 137-50 MCG/ACT SUSP Place 1 spray into the nose every 12 (twelve) hours. 23 g 2   cetirizine  (ZYRTEC  ALLERGY) 10 MG tablet Take 1 tablet (10 mg total) by mouth at bedtime. 90 tablet 1   HYDROcodone  bit-homatropine (HYCODAN) 5-1.5 MG/5ML syrup Take 5 mLs by mouth every 6 (six) hours as needed for cough. 90 mL 0   losartan  (COZAAR ) 50 MG tablet Take 1 tablet (50 mg total) by mouth daily. 90 tablet 0    ondansetron  (ZOFRAN -ODT) 4 MG disintegrating tablet Take 1 tablet (4 mg total) by mouth every 8 (eight) hours as needed for nausea or vomiting. 20 tablet 0   pantoprazole  (PROTONIX ) 40 MG tablet Take 1 tablet by mouth twice daily 180 tablet 0   predniSONE  (DELTASONE ) 20 MG tablet Take 2 tablets (40 mg total) by mouth daily. 10 tablet 0   promethazine -dextromethorphan (PROMETHAZINE -DM) 6.25-15 MG/5ML syrup Take 5 mLs by mouth 3 (three) times daily as needed for cough. 240 mL 0   No current facility-administered medications on file prior to visit.  [2]  Allergies Allergen Reactions   Penicillins Anaphylaxis    Did it involve swelling of the face/tongue/throat, SOB, or low BP? No Did it involve sudden or severe rash/hives, skin peeling, or any reaction on the inside of your mouth or nose? Yes Did you need to seek medical attention at a hospital or doctor's office? No When did it last happen?  30 years ago     If all above answers are NO, may proceed with cephalosporin use.   "

## 2024-03-15 ENCOUNTER — Other Ambulatory Visit: Payer: Self-pay | Admitting: Family

## 2024-03-15 DIAGNOSIS — Z1231 Encounter for screening mammogram for malignant neoplasm of breast: Secondary | ICD-10-CM

## 2024-04-11 ENCOUNTER — Ambulatory Visit

## 2024-04-22 ENCOUNTER — Ambulatory Visit

## 2024-05-27 ENCOUNTER — Ambulatory Visit: Payer: Self-pay | Admitting: Family
